# Patient Record
Sex: Male | Born: 1935
Health system: Southern US, Community
[De-identification: ages and names within clinical notes are randomized; demographics above are authoritative.]

## PROBLEM LIST (undated history)

## (undated) DIAGNOSIS — F419 Anxiety disorder, unspecified: Secondary | ICD-10-CM

## (undated) DIAGNOSIS — Z85828 Personal history of other malignant neoplasm of skin: Secondary | ICD-10-CM

## (undated) DIAGNOSIS — M51369 Other intervertebral disc degeneration, lumbar region without mention of lumbar back pain or lower extremity pain: Secondary | ICD-10-CM

## (undated) DIAGNOSIS — N201 Calculus of ureter: Secondary | ICD-10-CM

## (undated) DIAGNOSIS — M5136 Other intervertebral disc degeneration, lumbar region: Secondary | ICD-10-CM

## (undated) DIAGNOSIS — T7840XA Allergy, unspecified, initial encounter: Secondary | ICD-10-CM

## (undated) DIAGNOSIS — H269 Unspecified cataract: Secondary | ICD-10-CM

## (undated) DIAGNOSIS — E785 Hyperlipidemia, unspecified: Secondary | ICD-10-CM

## (undated) DIAGNOSIS — N138 Other obstructive and reflux uropathy: Secondary | ICD-10-CM

## (undated) DIAGNOSIS — N200 Calculus of kidney: Secondary | ICD-10-CM

## (undated) DIAGNOSIS — N210 Calculus in bladder: Secondary | ICD-10-CM

## (undated) DIAGNOSIS — L570 Actinic keratosis: Secondary | ICD-10-CM

## (undated) DIAGNOSIS — Z8589 Personal history of malignant neoplasm of other organs and systems: Secondary | ICD-10-CM

## (undated) DIAGNOSIS — N400 Enlarged prostate without lower urinary tract symptoms: Secondary | ICD-10-CM

## (undated) DIAGNOSIS — N401 Enlarged prostate with lower urinary tract symptoms: Secondary | ICD-10-CM

## (undated) DIAGNOSIS — R310 Gross hematuria: Secondary | ICD-10-CM

## (undated) DIAGNOSIS — F329 Major depressive disorder, single episode, unspecified: Secondary | ICD-10-CM

## (undated) DIAGNOSIS — M4300 Spondylolysis, site unspecified: Secondary | ICD-10-CM

## (undated) HISTORY — DX: Gross hematuria: R31.0

## (undated) HISTORY — DX: Benign prostatic hyperplasia with lower urinary tract symptoms: N40.1

## (undated) HISTORY — DX: Spondylolysis, site unspecified: M43.00

## (undated) HISTORY — DX: Unspecified cataract: H26.9

## (undated) HISTORY — DX: Actinic keratosis: L57.0

## (undated) HISTORY — PX: ILEOSTOMY: SHX1783

## (undated) HISTORY — DX: Major depressive disorder, single episode, unspecified: F32.9

## (undated) HISTORY — DX: Allergy, unspecified, initial encounter: T78.40XA

## (undated) HISTORY — DX: Calculus of kidney: N20.0

## (undated) HISTORY — DX: Calculus in bladder: N21.0

## (undated) HISTORY — DX: Other intervertebral disc degeneration, lumbar region: M51.36

## (undated) HISTORY — DX: Other obstructive and reflux uropathy: N13.8

## (undated) HISTORY — DX: Calculus of ureter: N20.1

## (undated) HISTORY — DX: Anxiety disorder, unspecified: F41.9

## (undated) HISTORY — DX: Personal history of malignant neoplasm of other organs and systems: Z85.89

## (undated) HISTORY — DX: Hyperlipidemia, unspecified: E78.5

## (undated) HISTORY — DX: Other intervertebral disc degeneration, lumbar region without mention of lumbar back pain or lower extremity pain: M51.369

## (undated) HISTORY — DX: Benign prostatic hyperplasia without lower urinary tract symptoms: N40.0

---
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
## 1991-01-26 HISTORY — PX: HERNIA REPAIR: SHX51

## 2009-06-24 DIAGNOSIS — C4491 Basal cell carcinoma of skin, unspecified: Secondary | ICD-10-CM

## 2009-06-24 HISTORY — DX: Basal cell carcinoma of skin, unspecified: C44.91

## 2012-09-11 DIAGNOSIS — C4492 Squamous cell carcinoma of skin, unspecified: Secondary | ICD-10-CM

## 2012-09-11 HISTORY — DX: Squamous cell carcinoma of skin, unspecified: C44.92

## 2013-02-07 ENCOUNTER — Emergency Department: Payer: Self-pay | Admitting: Emergency Medicine

## 2013-02-07 LAB — COMPREHENSIVE METABOLIC PANEL
ALK PHOS: 96 U/L
ANION GAP: 4 — AB (ref 7–16)
Albumin: 3.6 g/dL (ref 3.4–5.0)
BUN: 25 mg/dL — ABNORMAL HIGH (ref 7–18)
Bilirubin,Total: 0.4 mg/dL (ref 0.2–1.0)
Calcium, Total: 8.7 mg/dL (ref 8.5–10.1)
Chloride: 105 mmol/L (ref 98–107)
Co2: 28 mmol/L (ref 21–32)
Creatinine: 1.14 mg/dL (ref 0.60–1.30)
EGFR (African American): >60
EGFR (Non-African Amer.): >60
Glucose: 117 mg/dL — ABNORMAL HIGH (ref 65–99)
Osmolality: 279 (ref 275–301)
Potassium: 4.4 mmol/L (ref 3.5–5.1)
SGOT(AST): 36 U/L (ref 15–37)
SGPT (ALT): 34 U/L (ref 12–78)
SODIUM: 137 mmol/L (ref 136–145)
Total Protein: 6.5 g/dL (ref 6.4–8.2)

## 2013-02-07 LAB — CBC
HCT: 43.7 % (ref 40.0–52.0)
HGB: 14.8 g/dL (ref 13.0–18.0)
MCH: 29.2 pg (ref 26.0–34.0)
MCHC: 33.8 g/dL (ref 32.0–36.0)
MCV: 86 fL (ref 80–100)
Platelet: 135 10*3/uL — ABNORMAL LOW (ref 150–440)
RBC: 5.07 10*6/uL (ref 4.40–5.90)
RDW: 13 % (ref 11.5–14.5)
WBC: 11.2 10*3/uL — AB (ref 3.8–10.6)

## 2013-02-07 LAB — LIPASE, BLOOD: Lipase: 177 U/L (ref 73–393)

## 2013-02-08 LAB — URINALYSIS, COMPLETE
Bacteria: NONE SEEN
Bilirubin,UR: NEGATIVE
Glucose,UR: NEGATIVE mg/dL (ref 0–75)
Leukocyte Esterase: NEGATIVE
Nitrite: NEGATIVE
Ph: 5 (ref 4.5–8.0)
Protein: NEGATIVE
Specific Gravity: 1.017 (ref 1.003–1.030)
Squamous Epithelial: NONE SEEN

## 2013-02-10 ENCOUNTER — Emergency Department: Payer: Self-pay | Admitting: Emergency Medicine

## 2013-02-11 LAB — URINALYSIS, COMPLETE
Bilirubin,UR: NEGATIVE
GLUCOSE, UR: NEGATIVE mg/dL (ref 0–75)
Nitrite: NEGATIVE
Ph: 5 (ref 4.5–8.0)
Protein: NEGATIVE
RBC,UR: 2 /HPF (ref 0–5)
Specific Gravity: 1.015 (ref 1.003–1.030)
Squamous Epithelial: NONE SEEN

## 2013-02-12 ENCOUNTER — Emergency Department: Payer: Self-pay | Admitting: Emergency Medicine

## 2013-02-12 LAB — URINALYSIS, COMPLETE
BILIRUBIN, UR: NEGATIVE
Bacteria: NONE SEEN
Glucose,UR: NEGATIVE mg/dL (ref 0–75)
Ketone: NEGATIVE
NITRITE: NEGATIVE
PH: 5 (ref 4.5–8.0)
Protein: NEGATIVE
RBC,UR: 4 /HPF (ref 0–5)
Specific Gravity: 1.012 (ref 1.003–1.030)
Squamous Epithelial: NONE SEEN
WBC UR: 9 /HPF (ref 0–5)

## 2013-02-12 LAB — COMPREHENSIVE METABOLIC PANEL
AST: 54 U/L — AB (ref 15–37)
Albumin: 3.1 g/dL — ABNORMAL LOW (ref 3.4–5.0)
Alkaline Phosphatase: 92 U/L
Anion Gap: 4 — ABNORMAL LOW (ref 7–16)
BUN: 23 mg/dL — ABNORMAL HIGH (ref 7–18)
Bilirubin,Total: 0.6 mg/dL (ref 0.2–1.0)
CO2: 28 mmol/L (ref 21–32)
Calcium, Total: 8.2 mg/dL — ABNORMAL LOW (ref 8.5–10.1)
Chloride: 100 mmol/L (ref 98–107)
Creatinine: 1.52 mg/dL — ABNORMAL HIGH (ref 0.60–1.30)
EGFR (African American): 50 — ABNORMAL LOW
EGFR (Non-African Amer.): 43 — ABNORMAL LOW
Glucose: 126 mg/dL — ABNORMAL HIGH (ref 65–99)
Osmolality: 270 (ref 275–301)
POTASSIUM: 3.9 mmol/L (ref 3.5–5.1)
SGPT (ALT): 50 U/L (ref 12–78)
Sodium: 132 mmol/L — ABNORMAL LOW (ref 136–145)
TOTAL PROTEIN: 6.4 g/dL (ref 6.4–8.2)

## 2013-02-12 LAB — CBC
HCT: 40.6 % (ref 40.0–52.0)
HGB: 13.9 g/dL (ref 13.0–18.0)
MCH: 29.6 pg (ref 26.0–34.0)
MCHC: 34.1 g/dL (ref 32.0–36.0)
MCV: 87 fL (ref 80–100)
PLATELETS: 99 10*3/uL — AB (ref 150–440)
RBC: 4.68 10*6/uL (ref 4.40–5.90)
RDW: 12.8 % (ref 11.5–14.5)
WBC: 4.3 10*3/uL (ref 3.8–10.6)

## 2013-02-13 ENCOUNTER — Ambulatory Visit: Payer: Self-pay | Admitting: Urology

## 2013-02-15 ENCOUNTER — Ambulatory Visit: Payer: Self-pay | Admitting: Urology

## 2013-02-17 ENCOUNTER — Emergency Department: Payer: Self-pay | Admitting: Emergency Medicine

## 2013-02-17 LAB — COMPREHENSIVE METABOLIC PANEL
ALBUMIN: 3.8 g/dL (ref 3.4–5.0)
Alkaline Phosphatase: 114 U/L
Anion Gap: 4 — ABNORMAL LOW (ref 7–16)
BILIRUBIN TOTAL: 0.7 mg/dL (ref 0.2–1.0)
BUN: 14 mg/dL (ref 7–18)
CO2: 26 mmol/L (ref 21–32)
CREATININE: 1.07 mg/dL (ref 0.60–1.30)
Calcium, Total: 8.5 mg/dL (ref 8.5–10.1)
Chloride: 106 mmol/L (ref 98–107)
EGFR (Non-African Amer.): >60
Glucose: 118 mg/dL — ABNORMAL HIGH (ref 65–99)
OSMOLALITY: 274 (ref 275–301)
Potassium: 3.6 mmol/L (ref 3.5–5.1)
SGOT(AST): 39 U/L — ABNORMAL HIGH (ref 15–37)
SGPT (ALT): 70 U/L (ref 12–78)
Sodium: 136 mmol/L (ref 136–145)
TOTAL PROTEIN: 7.4 g/dL (ref 6.4–8.2)

## 2013-02-17 LAB — URINALYSIS, COMPLETE
RBC,UR: 36002 /HPF (ref 0–5)
Specific Gravity: 1.01 (ref 1.003–1.030)
Squamous Epithelial: NONE SEEN
WBC UR: 136 /HPF (ref 0–5)

## 2013-02-17 LAB — TROPONIN I

## 2013-02-17 LAB — CBC
HCT: 47.6 % (ref 40.0–52.0)
HGB: 16.1 g/dL (ref 13.0–18.0)
MCH: 29.4 pg (ref 26.0–34.0)
MCHC: 33.9 g/dL (ref 32.0–36.0)
MCV: 87 fL (ref 80–100)
Platelet: 223 10*3/uL (ref 150–440)
RBC: 5.49 10*6/uL (ref 4.40–5.90)
RDW: 13 % (ref 11.5–14.5)
WBC: 8.8 10*3/uL (ref 3.8–10.6)

## 2013-02-20 ENCOUNTER — Emergency Department: Payer: Self-pay | Admitting: Emergency Medicine

## 2013-02-20 LAB — COMPREHENSIVE METABOLIC PANEL
AST: 38 U/L — AB (ref 15–37)
Albumin: 3.8 g/dL (ref 3.4–5.0)
Alkaline Phosphatase: 121 U/L — ABNORMAL HIGH
Anion Gap: 6 — ABNORMAL LOW (ref 7–16)
BILIRUBIN TOTAL: 0.7 mg/dL (ref 0.2–1.0)
BUN: 21 mg/dL — ABNORMAL HIGH (ref 7–18)
CO2: 24 mmol/L (ref 21–32)
Calcium, Total: 9.2 mg/dL (ref 8.5–10.1)
Chloride: 105 mmol/L (ref 98–107)
Creatinine: 1.05 mg/dL (ref 0.60–1.30)
EGFR (Non-African Amer.): >60
GLUCOSE: 76 mg/dL (ref 65–99)
Osmolality: 272 (ref 275–301)
POTASSIUM: 3.9 mmol/L (ref 3.5–5.1)
SGPT (ALT): 62 U/L (ref 12–78)
SODIUM: 135 mmol/L — AB (ref 136–145)
Total Protein: 7.4 g/dL (ref 6.4–8.2)

## 2013-02-20 LAB — URINALYSIS, COMPLETE
Bacteria: NONE SEEN
Hyaline Cast: 196
Specific Gravity: 1.018 (ref 1.003–1.030)
WBC UR: 209 /HPF (ref 0–5)

## 2013-02-20 LAB — CBC
HCT: 50.4 % (ref 40.0–52.0)
HGB: 16.9 g/dL (ref 13.0–18.0)
MCH: 29.2 pg (ref 26.0–34.0)
MCHC: 33.5 g/dL (ref 32.0–36.0)
MCV: 87 fL (ref 80–100)
PLATELETS: 255 10*3/uL (ref 150–440)
RBC: 5.79 10*6/uL (ref 4.40–5.90)
RDW: 13.2 % (ref 11.5–14.5)
WBC: 9.9 10*3/uL (ref 3.8–10.6)

## 2013-02-23 ENCOUNTER — Ambulatory Visit: Payer: Self-pay | Admitting: Urology

## 2013-02-25 DIAGNOSIS — N200 Calculus of kidney: Secondary | ICD-10-CM

## 2013-02-25 HISTORY — DX: Calculus of kidney: N20.0

## 2013-03-25 HISTORY — PX: LITHOTRIPSY: SUR834

## 2013-03-29 ENCOUNTER — Ambulatory Visit: Payer: Self-pay | Admitting: Urology

## 2013-03-29 DIAGNOSIS — N201 Calculus of ureter: Secondary | ICD-10-CM | POA: Insufficient documentation

## 2013-03-29 HISTORY — DX: Calculus of ureter: N20.1

## 2013-04-26 ENCOUNTER — Ambulatory Visit: Payer: Self-pay | Admitting: Urology

## 2013-04-26 DIAGNOSIS — N210 Calculus in bladder: Secondary | ICD-10-CM | POA: Insufficient documentation

## 2013-04-26 HISTORY — DX: Calculus in bladder: N21.0

## 2013-05-01 ENCOUNTER — Ambulatory Visit: Payer: Self-pay | Admitting: Urology

## 2013-05-10 ENCOUNTER — Ambulatory Visit: Payer: Self-pay | Admitting: Urology

## 2013-05-21 ENCOUNTER — Ambulatory Visit: Payer: Self-pay | Admitting: Urology

## 2013-06-06 ENCOUNTER — Ambulatory Visit: Payer: Self-pay | Admitting: Urology

## 2013-06-20 ENCOUNTER — Ambulatory Visit: Payer: Self-pay | Admitting: Urology

## 2013-06-20 DIAGNOSIS — N400 Enlarged prostate without lower urinary tract symptoms: Secondary | ICD-10-CM

## 2013-06-20 HISTORY — DX: Benign prostatic hyperplasia without lower urinary tract symptoms: N40.0

## 2013-06-23 LAB — URINE CULTURE

## 2013-06-27 ENCOUNTER — Emergency Department: Payer: Self-pay | Admitting: Emergency Medicine

## 2013-06-27 ENCOUNTER — Ambulatory Visit: Payer: Self-pay | Admitting: Urology

## 2013-06-28 DIAGNOSIS — R310 Gross hematuria: Secondary | ICD-10-CM | POA: Insufficient documentation

## 2013-06-28 HISTORY — DX: Gross hematuria: R31.0

## 2014-03-27 ENCOUNTER — Ambulatory Visit: Payer: Self-pay | Admitting: Urology

## 2014-05-18 NOTE — Op Note (Signed)
PATIENT NAME:  Todd Bennett, THEARD MR#:  [id number] DATE OF BIRTH:  05-18-1932  DATE OF PROCEDURE:  02/15/2013  PREOPERATIVE DIAGNOSIS: Right proximal ureteral calculus with pain.   POSTOPERATIVE DIAGNOSIS: Passed right ureteral calculus.   PROCEDURE: Cystoscopy, right retrograde pyelogram.   SURGEON: Richard D. Elnoria Howard, DO  ANESTHESIA: General.   DESCRIPTION OF PROCEDURE: After an appropriate timeout and review of the patient's surgical consent, we began the procedure. The patient was sterilely prepped and draped in supine lithotomy position for ease of approach to the external genitalia. The urethra was easily instrumented with a 21-French sheath and a 4 oblique lens. Looking in the bladder I see a stone lying in the base of the bladder. It was a good sized calculus. I did not grasp it at this point as I wanted to do a retrograde. Retrograde pyelogram was done. The kidney easily empties. There is no obstruction to the ureter. There is no sign of another calculus in the ureter. So, the bladder was emptied. I attempted to evacuate the calculus with irrigation, but not able to, into a trabeculation in the bladder. The rest of the bladder showed some course trabeculation with prostatic enlargement with no stricture disease. So, at the end of the procedure, the bladder was emptied. No rectal was done as the patient has no rectum from a colectomy many years ago and ileostomy. So, we put 30 mL of Marcaine in the bladder, and he was sent to recovery in satisfactory condition.   ____________________________ Janice Coffin. Elnoria Howard, [place] rdh:lb D: 02/15/2013 12:07:05 ET T: 02/15/2013 12:12:50 ET JOB#: [id number]  cc: Janice Coffin. Elnoria Howard, DO, <Dictator> RICHARD D HART DO ELECTRONICALLY SIGNED 02/26/2013 16:38

## 2014-05-18 NOTE — Op Note (Signed)
PATIENT NAME:  Todd Bennett, Todd Bennett MR#:  [id number] DATE OF BIRTH:  07-06-1932  DATE OF PROCEDURE:  06/27/2013  PREOPERATIVE DIAGNOSES:  1. Left distal ureteral calculus.  2. Bladder calculus.   POSTOPERATIVE DIAGNOSES:  1. Left distal ureteral calculus.  2. Bladder calculus.   PROCEDURES:  1. Left ureteroscopy with laser lithotripsy/stone extraction.  2. Basket extraction of bladder calculus.   SURGEON: Scott C. Bernardo Heater, MD  ASSISTANT: None.   ANESTHETIC: General.   INDICATIONS: A 81 year old male was initially diagnosed in January 2015 with right renal colic. He was scheduled for placement of a right ureteral stent by Dr. Elnoria Howard. However, at the time of his procedure, the stone was no longer present in the ureter and was in the bladder and apparently could not be removed. He does have prostate enlargement and enlarged median lobe and has been unable to pass the stone, though asymptomatic. On review of his x-rays, he was also noted to have a nonobstructing 5 or 6 mm left distal ureteral calculus. He underwent shock wave lithotripsy without significant fragmentation. He presents for left ureteroscopic stone removal and removal of his bladder calculus.   DESCRIPTION OF PROCEDURE: He was taken to the cystoscopy suite, placed on the table in the supine position. A general anesthetic was administered via an LMA. He was then placed in the low lithotomy position, and his external genitalia were prepped and draped in the usual fashion. A timeout was performed per protocol. A 21 French cystoscope with 30 degree lens was lubricated and passed under direct vision. The urethra was normal in caliber without stricture. Prostate was remarkable for touching lateral lobes and a moderate to large median lobe. Bladder mucosa was closely inspected, and there was no erythema, solid or papillary lesions. The bladder stone was visualized at the base of the bladder. The ureteral orifices were normal-appearing with clear  efflux. A 0.102 hydrophilic guidewire was placed through the cystoscope and into the left ureteral orifice and passed up into the renal pelvis under fluoroscopic guidance without difficulty. The cystoscope was removed, and a 6 French semirigid ureteroscope was passed per urethra. The bladder was entered, and the left ureter was entered without difficulty. In the midportion of the distal ureter, the ureteral calculus was visualized, without significant inflammatory change. A 365 micron holmium laser fiber was placed through the ureteroscope, and the stone was easily fragmented. The fragments were removed with aid of a 3 [place] nitinol basket. The ureteroscope was repassed up the proximal ureter, and no additional stones or stone fragments were seen. There was no significant change at the distal ureter seen, and it was elected not to place a ureteral stent.   The ureteroscope was removed. The cystoscope was repassed. The bladder calculus was ensnared with the 3 French nitinol basket and removed without difficulty. He had a history of hematuria and urinary retention with his previous procedure, and an 38 French Foley catheter was placed without difficulty. The catheter was irrigated, with return of pink-tinged effluent. He was taken to the PACU in stable condition. There were no complications. EBL was minimal.     ____________________________ Ronda Fairly. Bernardo Heater, MD scs:lb D: 06/28/2013 10:52:03 ET T: 06/28/2013 11:20:39 ET JOB#: [id number]  cc: Nicki Reaper C. Bernardo Heater, MD, <Dictator> Abbie Sons MD ELECTRONICALLY SIGNED 07/04/2013 15:19

## 2014-06-17 DIAGNOSIS — F32A Depression, unspecified: Secondary | ICD-10-CM | POA: Insufficient documentation

## 2014-06-17 DIAGNOSIS — E785 Hyperlipidemia, unspecified: Secondary | ICD-10-CM

## 2014-06-17 DIAGNOSIS — F329 Major depressive disorder, single episode, unspecified: Secondary | ICD-10-CM | POA: Insufficient documentation

## 2014-06-17 DIAGNOSIS — F419 Anxiety disorder, unspecified: Secondary | ICD-10-CM

## 2014-06-17 DIAGNOSIS — N138 Other obstructive and reflux uropathy: Secondary | ICD-10-CM

## 2014-06-17 DIAGNOSIS — N401 Enlarged prostate with lower urinary tract symptoms: Secondary | ICD-10-CM

## 2014-06-17 HISTORY — DX: Benign prostatic hyperplasia with lower urinary tract symptoms: N13.8

## 2014-06-17 HISTORY — DX: Hyperlipidemia, unspecified: E78.5

## 2014-06-17 HISTORY — DX: Anxiety disorder, unspecified: F41.9

## 2014-06-17 HISTORY — DX: Depression, unspecified: F32.A

## 2014-10-01 ENCOUNTER — Encounter: Payer: Self-pay | Admitting: Podiatry

## 2014-10-01 ENCOUNTER — Ambulatory Visit (INDEPENDENT_AMBULATORY_CARE_PROVIDER_SITE_OTHER): Payer: PPO

## 2014-10-01 ENCOUNTER — Ambulatory Visit (INDEPENDENT_AMBULATORY_CARE_PROVIDER_SITE_OTHER): Payer: PPO | Admitting: Podiatry

## 2014-10-01 VITALS — BP 126/63 | HR 74 | Resp 18

## 2014-10-01 DIAGNOSIS — R52 Pain, unspecified: Secondary | ICD-10-CM

## 2014-10-01 DIAGNOSIS — M779 Enthesopathy, unspecified: Secondary | ICD-10-CM | POA: Diagnosis not present

## 2014-10-01 DIAGNOSIS — M19072 Primary osteoarthritis, left ankle and foot: Secondary | ICD-10-CM

## 2014-10-01 DIAGNOSIS — M898X9 Other specified disorders of bone, unspecified site: Secondary | ICD-10-CM

## 2014-10-01 NOTE — Progress Notes (Signed)
   Subjective:    Patient ID: Todd Bennett, male    DOB: [birth year], 81 y.o.   MRN: [id number]  HPI  81 year old male presents the office of this wife for concerns of left foot pain which has been ongoing for approximately 2 weeks. He states that he is started to note some not form the top of his left foot and pain to the area when he walks for prolonged distances. He does walk a treadmill about 3-4 miles a day however he is been doing this for quite some time and has not changed increases her activity recently. He doesn't assess the pain started he did decrease his walking and started to ride a stationary bike and the pain was not as significant. He denies any recent injury or trauma. Denies any tingling or numbness. The pain does not wake him up at night. He has had no previous treatment. No other complaints at this time   Review of Systems  All other systems reviewed and are negative.      Objective:   Physical Exam AAO x3, NAD DP/PT pulses palpable bilaterally, CRT less than 3 seconds Protective sensation intact with Simms Weinstein monofilament, vibratory sensation intact, Achilles tendon reflex intact On the dorsal aspect left midfoot there is a localized area of edema and what appears to be a small mobile fluid-filled soft tissue mass along the midfoot. There is underlying bony exostosis palpable. There is tenderness overlying this area. There is no overlying erythema or increase in warmth. There is no other areas of tenderness to bilateral lower extremities.  MMT 5/5, ROM WNL.  No open lesions or pre-ulcerative lesions.  No overlying edema, erythema, increase in warmth to bilateral lower extremities.  No pain with calf compression, swelling, warmth, erythema bilaterally.      Assessment & Plan:  81 year old male left dorsal midfoot exostosis, arthritis with likely ganglion cyst, localized edema -Treatment options discussed including all alternatives, risks, and complications -X-rays  were obtained and reviewed with the patient.  -Discussed etiology of his symptoms. -I discussed steroid injection area to help decrease the inflammation of the pain. He would proceed with this. I discussed risks and palpitations for which he verbally consents to. Under sterile conditions a total of 1.5 mL of a mixture of dexamethasone phosphate and 0.5% Marcaine plain was infiltrated into the area of maximal tenderness on the dorsal aspect of the left midfoot. He tolerated the injection well any complications. Post injection care was discussed. -Discussed with him to retire his shoes to skip a hole to avoid putting pressure overlying the area. -I discussed some orthotics to help support his foot type and help take pressure off the midfoot. -Follow-up 3-4 weeks if symptoms continue or sooner if any problems arise. In the meantime, encouraged to call the office with any questions, concerns, change in symptoms.   Celesta Gentile, DPM

## 2014-10-03 ENCOUNTER — Encounter: Payer: Self-pay | Admitting: Podiatry

## 2014-10-19 ENCOUNTER — Other Ambulatory Visit: Payer: Self-pay | Admitting: Urology

## 2014-10-22 ENCOUNTER — Ambulatory Visit: Payer: PPO | Admitting: Podiatry

## 2014-10-29 ENCOUNTER — Ambulatory Visit (INDEPENDENT_AMBULATORY_CARE_PROVIDER_SITE_OTHER): Payer: PPO | Admitting: Podiatry

## 2014-10-29 ENCOUNTER — Encounter: Payer: Self-pay | Admitting: Podiatry

## 2014-10-29 VITALS — BP 148/62 | HR 71 | Resp 18

## 2014-10-29 DIAGNOSIS — M898X9 Other specified disorders of bone, unspecified site: Secondary | ICD-10-CM

## 2014-10-29 DIAGNOSIS — M216X9 Other acquired deformities of unspecified foot: Secondary | ICD-10-CM

## 2014-10-29 DIAGNOSIS — M6740 Ganglion, unspecified site: Secondary | ICD-10-CM

## 2014-10-29 NOTE — Progress Notes (Signed)
Patient ID: Todd Bennett, male   DOB: 24-Oct-1932, 81 y.o.   MRN: [id number]  Subjective: 81 year old male presents the office they for follow-up evaluation reoccurrence of left midfoot pain and swelling. He states that he canceled last week's appointment as he was doing well however over the week and the pain started to recur.  He states at the last appointment the swelling and the pain resolved.He states he gets some discomfort around the area with shoe gear. Denies any recent injury or trauma. Denies any redness overlying the area. No tingling or numbness. No other complaints at this time in no acute changes otherwise.  Objective: AAO 3, NAD Neurovascular status intact and unchanged On the dorsal aspect of left midfoot there is a localized area of edema which appears to be a fluid-filled mass overlying the area.There is a palpable exostosis off the midfoot lateral to the dorsalis pedis. There is no overlying erythema or increase in warmth over the area. There is localized tenderness overlying this area of exostosis and soft tissue mass. No other areas of edema, erythema, increase in warmth. There is increase in calcaneal inclination angle. There are no open lesions or pre-ulcerative lesions. There is no pain with calf compression, swelling, warmth, erythema.  Assessment: 81 year old male reoccurrence of dorsal midfoot exostosis , soft tissue mass  Plan: -Treatment options discussed including all alternatives, risks, and complications -Etiology of symptoms were discussed - I discussed aspiration of the parents cyst. I discussed risks and complications for which she understands and wishes to proceed with this. Under sterile conditions a total of 1.5 mL of a one-to-one mixture of 2% lidocaine plain and 0.5% Marcaine plain was infiltrated in a regional block fashion. Once anesthetized the skin was then prepped in sterile fashion. A #18-gauge needle and syringe was utilized to aspirate the area soft  tissue mass.There was a very small amount of clear fluid and some blood that was aspirated. Total of 1 mL mixture of baclofen phosphate and 0.5% Marcaine plain was infiltrated into this area without complications. Hemostasis was achieved. The compression bandage was applied. Post procedure care was discussed. -Given his foot type he would likely benefit from orthotics. A prescription for inserts were given to the patient for [facility]. -Follow-up after inserts or sooner if any problems arise. In the meantime, encouraged to call the office with any questions, concerns, change in symptoms.   Celesta Gentile, DPM

## 2015-03-10 DIAGNOSIS — D044 Carcinoma in situ of skin of scalp and neck: Secondary | ICD-10-CM | POA: Diagnosis not present

## 2015-03-10 DIAGNOSIS — L570 Actinic keratosis: Secondary | ICD-10-CM | POA: Diagnosis not present

## 2015-03-10 DIAGNOSIS — L578 Other skin changes due to chronic exposure to nonionizing radiation: Secondary | ICD-10-CM | POA: Diagnosis not present

## 2015-03-10 DIAGNOSIS — D485 Neoplasm of uncertain behavior of skin: Secondary | ICD-10-CM | POA: Diagnosis not present

## 2015-03-10 DIAGNOSIS — L718 Other rosacea: Secondary | ICD-10-CM | POA: Diagnosis not present

## 2015-03-10 DIAGNOSIS — L820 Inflamed seborrheic keratosis: Secondary | ICD-10-CM | POA: Diagnosis not present

## 2015-03-12 DIAGNOSIS — Z0000 Encounter for general adult medical examination without abnormal findings: Secondary | ICD-10-CM | POA: Diagnosis not present

## 2015-03-20 DIAGNOSIS — N401 Enlarged prostate with lower urinary tract symptoms: Secondary | ICD-10-CM | POA: Diagnosis not present

## 2015-03-20 DIAGNOSIS — F329 Major depressive disorder, single episode, unspecified: Secondary | ICD-10-CM | POA: Diagnosis not present

## 2015-03-20 DIAGNOSIS — Z0000 Encounter for general adult medical examination without abnormal findings: Secondary | ICD-10-CM | POA: Diagnosis not present

## 2015-03-20 DIAGNOSIS — E785 Hyperlipidemia, unspecified: Secondary | ICD-10-CM | POA: Diagnosis not present

## 2015-04-01 ENCOUNTER — Ambulatory Visit: Payer: Self-pay | Admitting: Urology

## 2015-04-02 ENCOUNTER — Ambulatory Visit
Admission: RE | Admit: 2015-04-02 | Discharge: 2015-04-02 | Disposition: A | Discharge status: Home / Self Care | Payer: PPO | Source: Ambulatory Visit | Attending: Urology | Admitting: Urology

## 2015-04-02 ENCOUNTER — Encounter: Payer: Self-pay | Admitting: Urology

## 2015-04-02 ENCOUNTER — Ambulatory Visit (INDEPENDENT_AMBULATORY_CARE_PROVIDER_SITE_OTHER): Payer: PPO | Admitting: Urology

## 2015-04-02 VITALS — BP 136/73 | HR 82 | Ht 71.0 in | Wt 168.0 lb

## 2015-04-02 DIAGNOSIS — Z87442 Personal history of urinary calculi: Secondary | ICD-10-CM

## 2015-04-02 DIAGNOSIS — N400 Enlarged prostate without lower urinary tract symptoms: Secondary | ICD-10-CM | POA: Diagnosis not present

## 2015-04-02 DIAGNOSIS — R339 Retention of urine, unspecified: Secondary | ICD-10-CM | POA: Diagnosis not present

## 2015-04-02 DIAGNOSIS — N200 Calculus of kidney: Secondary | ICD-10-CM | POA: Insufficient documentation

## 2015-04-02 LAB — URINALYSIS, COMPLETE
Bilirubin, UA: NEGATIVE
Glucose, UA: NEGATIVE
Ketones, UA: NEGATIVE
Leukocytes, UA: NEGATIVE
NITRITE UA: NEGATIVE
PH UA: 5.5 (ref 5.0–7.5)
PROTEIN UA: NEGATIVE
RBC, UA: NEGATIVE
SPEC GRAV UA: 1.01 (ref 1.005–1.030)
UUROB: 0.2 mg/dL (ref 0.2–1.0)

## 2015-04-02 LAB — MICROSCOPIC EXAMINATION
BACTERIA UA: NONE SEEN
EPITHELIAL CELLS (NON RENAL): NONE SEEN /HPF (ref 0–10)
WBC UA: NONE SEEN /HPF (ref 0–?)

## 2015-04-02 LAB — BLADDER SCAN AMB NON-IMAGING

## 2015-04-02 NOTE — Progress Notes (Signed)
04/02/2015 9:16 AM   Todd Bennett 1932/06/06 [id number]  Referring provider: [name], MD [address] [place], [place] [address]  Chief Complaint  Patient presents with  . Benign Prostatic Hypertrophy    1year     HPI:  BPH/ incomplete bladder emptying No UTIs over past year.  Remote history of bladder stones in '86. History of post op urinary retention. Currently on Flomax and finasteride.   PVR today 150  Cr stable, normal No voiding complaints today other than frequency, urgency but does not want medication for this, IPSS below  History of nephrolithiasis H/o calcium oxalate monohydrate/ Ca Phosphate stones H/o ilostomy, struggles with dehydration [id number] multiple recurrent stones,  2015 left ESWL followed by left ureteroscopy by Dr. Elnoria Howard for 6 mm ureteral stone No recent flank pain KUB today with punctate bilateral stones, stable x 1 year       IPSS      04/02/15 0900       International Prostate Symptom Score   How often have you had the sensation of not emptying your bladder? About half the time     How often have you had to urinate less than every two hours? Almost always     How often have you found you stopped and started again several times when you urinated? About half the time     How often have you found it difficult to postpone urination? More than half the time     How often have you had a weak urinary stream? Less than half the time     How often have you had to strain to start urination? Less than half the time     How many times did you typically get up at night to urinate? 1 Time     Total IPSS Score 20     Quality of Life due to urinary symptoms   If you were to spend the rest of your life with your urinary condition just the way it is now how would you feel about that? Mixed        Score:  1-7 Mild 8-19 Moderate 20-35 Severe   PMH: Past Medical History  Diagnosis Date  . Benign prostatic hyperplasia with urinary  obstruction 06/17/2014  . Benign prostatic hypertrophy without urinary obstruction 06/20/2013  . Bladder calculi 04/26/2013  . Calculi, ureter 03/29/2013  . Calculus of kidney 02/25/2013  . Frank hematuria 06/28/2013  . Anxiety 06/17/2014  . Clinical depression 06/17/2014  . HLD (hyperlipidemia) 06/17/2014    Surgical History: Past Surgical History  Procedure Laterality Date  . Ileostomy    . Lithotripsy  03/2013  . Hernia repair  1993    Home Medications:    Medication List       This list is accurate as of: 04/02/15  9:16 AM.  Always use your most recent med list.               ACZONE 5 % topical gel  Generic drug:  Dapsone     aspirin EC 81 MG tablet  Take by mouth.     CoQ-10 200 MG Caps  Take 200 mg by mouth.     finasteride 5 MG tablet  Commonly known as:  PROSCAR  TAKE 1 TABLET EVERY DAY     Fish Oil 1000 MG Caps  Take by mouth.     FLUoxetine 20 MG capsule  Commonly known as:  PROZAC  Take 20 mg by mouth.  metroNIDAZOLE 1 % gel  Commonly known as:  METROGEL  Apply topically.     MULTI-VITAMINS Tabs  Take by mouth.     niacin 500 MG tablet  Take 500 mg by mouth.     prednisoLONE acetate 1 % ophthalmic suspension  Commonly known as:  PRED FORTE  PLACE 1 DROP IN THE LEFT EYE DAILY     tamsulosin 0.4 MG Caps capsule  Commonly known as:  FLOMAX  Take 0.4 mg by mouth.     venlafaxine XR 150 MG 24 hr capsule  Commonly known as:  EFFEXOR-XR  Take 150 mg by mouth.     vitamin C 1000 MG tablet  Take 1,000 mg by mouth.     VITAMIN D-1000 MAX ST 1000 units tablet  Generic drug:  Cholecalciferol  Take by mouth.        Allergies:  Allergies  Allergen Reactions  . Ciprofloxacin   . Clarithromycin   . Oxycodone   . Penicillins   . Sulfur     Family History: Family History  Problem Relation Age of Onset  . Diabetes Mother   . Prostate cancer Neg Hx   . Bladder Cancer Neg Hx     Social History:  reports that he has quit smoking. He has  never used smokeless tobacco. He reports that he does not drink alcohol or use illicit drugs.  ROS: UROLOGY Frequent Urination?: No Hard to postpone urination?: Yes Burning/pain with urination?: No Get up at night to urinate?: Yes Leakage of urine?: No Urine stream starts and stops?: No Trouble starting stream?: No Do you have to strain to urinate?: No Blood in urine?: No Urinary tract infection?: No Sexually transmitted disease?: No Injury to kidneys or bladder?: No Painful intercourse?: No Weak stream?: No Erection problems?: Yes Penile pain?: No  Gastrointestinal Nausea?: No Vomiting?: No Indigestion/heartburn?: No Diarrhea?: No Constipation?: No  Constitutional Fever: No Night sweats?: No Weight loss?: No Fatigue?: No  Skin Skin rash/lesions?: No Itching?: No  Eyes Blurred vision?: No Double vision?: No  Ears/Nose/Throat Sore throat?: No Sinus problems?: No  Hematologic/Lymphatic Swollen glands?: No Easy bruising?: No  Cardiovascular Leg swelling?: No Chest pain?: No  Respiratory Cough?: No Shortness of breath?: No  Endocrine Excessive thirst?: No  Musculoskeletal Back pain?: No Joint pain?: No  Neurological Headaches?: No Dizziness?: No  Psychologic Depression?: No Anxiety?: No  Physical Exam: BP 136/73 mmHg  Pulse 82  Ht 5' 11"  (1.803 m)  Wt 168 lb (76.204 kg)  BMI 23.44 kg/m2  Constitutional:  Alert and oriented, No acute distress. HEENT: [place] AT, moist mucus membranes.  Trachea midline, no masses. Cardiovascular: No clubbing, cyanosis, or edema. Respiratory: Normal respiratory effort, no increased work of breathing. GI: Abdomen is soft, nontender, nondistended, no abdominal masses GU: No CVA tenderness.  Skin: No rashes, bruises or suspicious lesions. Neurologic: Grossly intact, no focal deficits, moving all 4 extremities. Psychiatric: Normal mood and affect.  Laboratory Data: Lab Results  Component Value Date   WBC 9.9  02/20/2013   HGB 16.9 02/20/2013   HCT 50.4 02/20/2013   MCV 87 02/20/2013   PLT 255 02/20/2013    Comprehensive Metabolic Panel (CMP) (22/63/3354 8:39 AM) Comprehensive Metabolic Panel (CMP) (56/25/6389 8:39 AM)  Component Value Ref Range  Glucose 107 70 - 110 mg/dL  Sodium 140 136 - 145 mmol/L  Potassium 4.2 3.6 - 5.1 mmol/L  Chloride 103 97 - 109 mmol/L  Carbon Dioxide (CO2) 27.6 22.0 - 32.0 mmol/L  Urea  Nitrogen (BUN) 17 7 - 25 mg/dL  Creatinine 1.0 0.7 - 1.3 mg/dL  Glomerular Filtration Rate (eGFR), MDRD Estimate 72 >60 mL/min/1.73sq m    PSA 03/12/15 0.25, stable from 0.29 on 11/25  Urinalysis Urinalysis w/Microscopic (03/12/2015 8:39 AM) Urinalysis w/Microscopic (03/12/2015 8:39 AM)  Component Value Ref Range  Color Yellow Yellow, Straw  Clarity Clear Clear  Specific Gravity 1.010 1.000 - 1.030  pH, Urine 5.5 5.0 - 8.0  Protein, Urinalysis Negative Negative, Trace mg/dL  Glucose, Urinalysis Negative Negative mg/dL  Ketones, Urinalysis Negative Negative mg/dL  Blood, Urinalysis Negative Negative  Nitrite, Urinalysis Negative Negative  Leukocyte Esterase, Urinalysis Negative Negative  White Blood Cells, Urinalysis None Seen None Seen, 0-3 /hpf  Red Blood Cells, Urinalysis None Seen None Seen, 0-3 /hpf  Bacteria, Urinalysis None Seen None Seen /hpf  Squamous Epithelial Cells, Urinalysis None Seen Rare, Few, None Seen /hpf    Pertinent Imaging: CLINICAL DATA: History of kidney stones.  EXAM: ABDOMEN - 1 VIEW  COMPARISON: 03/27/2014  FINDINGS: Punctate stones project over the lower pole of both kidneys as seen on prior CT. Calcified phleboliths in the left pelvis. No organomegaly. No evidence of bowel obstruction. No free air.  IMPRESSION: Punctate bilateral nephrolithiasis.   Electronically Signed  By: Rolm Baptise M.D.  On: 04/02/2015 10:03  Assessment & Plan:    1. BPH (benign prostatic hyperplasia) Continue finasteride and flomax PSA  low - Urinalysis, Complete - BLADDER SCAN AMB NON-IMAGING  2. Incomplete bladder emptying PVR today borderline but no sequela (normal Cr, no bladder stones, no UTIs) Continue to monitor with annual PVR, return sooner if unable to void  3. History of nephrolithiasis KUB stable today Today to encourage hydration Return sooner if develops flank pain - DG Abd 1 View   Return in about 1 year (around 04/01/2016) for KUB, IPSS, PVR.  Hollice Espy, MD  [facility] [address] [place], [place] [address] [phone]

## 2015-04-03 DIAGNOSIS — L578 Other skin changes due to chronic exposure to nonionizing radiation: Secondary | ICD-10-CM | POA: Diagnosis not present

## 2015-04-03 DIAGNOSIS — Z85828 Personal history of other malignant neoplasm of skin: Secondary | ICD-10-CM | POA: Diagnosis not present

## 2015-04-03 DIAGNOSIS — D0439 Carcinoma in situ of skin of other parts of face: Secondary | ICD-10-CM | POA: Diagnosis not present

## 2015-04-18 ENCOUNTER — Ambulatory Visit (INDEPENDENT_AMBULATORY_CARE_PROVIDER_SITE_OTHER): Payer: PPO | Admitting: Sports Medicine

## 2015-04-18 ENCOUNTER — Encounter: Payer: Self-pay | Admitting: Sports Medicine

## 2015-04-18 DIAGNOSIS — M216X9 Other acquired deformities of unspecified foot: Secondary | ICD-10-CM

## 2015-04-18 DIAGNOSIS — M6740 Ganglion, unspecified site: Secondary | ICD-10-CM | POA: Diagnosis not present

## 2015-04-18 DIAGNOSIS — M19072 Primary osteoarthritis, left ankle and foot: Secondary | ICD-10-CM

## 2015-04-18 DIAGNOSIS — M79672 Pain in left foot: Secondary | ICD-10-CM

## 2015-04-18 DIAGNOSIS — M898X9 Other specified disorders of bone, unspecified site: Secondary | ICD-10-CM

## 2015-04-18 NOTE — Progress Notes (Signed)
Patient ID: Todd Bennett, male   DOB: 24-Sep-1932, 82 y.o.   MRN: [id number] Subjective: Todd Bennett is a 82 y.o. male patient who presents to office for evaluation of left foot pain. Patient states that over the last few days he started to have more pain and the cyst at the top of his foot becoming larger; reports that he had injection back in October that helped tremendously; patient requesting re-injection. Patient denies any other pedal complaints. Denies acute injury/trip/fall/sprain/any other causative factors.   Patient Active Problem List   Diagnosis Date Noted  . Anxiety 06/17/2014  . Benign prostatic hyperplasia with urinary obstruction 06/17/2014  . Clinical depression 06/17/2014  . HLD (hyperlipidemia) 06/17/2014  . Frank hematuria 06/28/2013  . Benign prostatic hypertrophy without urinary obstruction 06/20/2013  . Bladder calculi 04/26/2013  . Calculi, ureter 03/29/2013  . Calculus of kidney 02/25/2013    Current Outpatient Prescriptions on File Prior to Visit  Medication Sig Dispense Refill  . Ascorbic Acid (VITAMIN C) 1000 MG tablet Take 1,000 mg by mouth.    Marland Kitchen aspirin EC 81 MG tablet Take by mouth.    . Cholecalciferol (VITAMIN D-1000 MAX ST) 1000 UNITS tablet Take by mouth.    . Coenzyme Q10 (COQ-10) 200 MG CAPS Take 200 mg by mouth.    . Dapsone (ACZONE) 5 % topical gel     . finasteride (PROSCAR) 5 MG tablet TAKE 1 TABLET EVERY DAY 90 tablet 2  . FLUoxetine (PROZAC) 20 MG capsule Take 20 mg by mouth.    . metroNIDAZOLE (METROGEL) 1 % gel Apply topically.    . Multiple Vitamin (MULTI-VITAMINS) TABS Take by mouth.    . niacin 500 MG tablet Take 500 mg by mouth.    . Omega-3 Fatty Acids (FISH OIL) 1000 MG CAPS Take by mouth.    . prednisoLONE acetate (PRED FORTE) 1 % ophthalmic suspension PLACE 1 DROP IN THE LEFT EYE DAILY  3  . tamsulosin (FLOMAX) 0.4 MG CAPS capsule Take 0.4 mg by mouth.    . venlafaxine XR (EFFEXOR-XR) 150 MG 24 hr capsule Take 150 mg by mouth.      No current facility-administered medications on file prior to visit.    Allergies  Allergen Reactions  . Ciprofloxacin   . Clarithromycin   . Oxycodone   . Penicillins   . Sulfur     Objective:  General: Alert and oriented x3 in no acute distress  Dermatology: Focal transilluminating non pulsatile soft tissue mass resembling cyst dorsal midfoot on left, No signs of infection, no open lesions bilateral lower extremities, no webspace macerations, no ecchymosis bilateral, all nails x 10 are well manicured.  Vascular: Dorsalis Pedis and Posterior Tibial pedal pulses palpable, Capillary Fill Time 3 seconds,(+) pedal hair growth bilateral, Temperature gradient within normal limits.  Neurology: Johney Maine sensation intact via light touch bilateral. (- )Tinels sign bilateral.   Musculoskeletal: Mild tenderness with palpation at ganglion on left and across midtarsal joint on left with palpable exostosis, No pain with calf compression bilateral. Pes Cavus foot type. Strength within normal limits in all groups bilateral.   Assessment and Plan: Problem List Items Addressed This Visit    None    Visit Diagnoses    Ganglion    -  Primary    Bony exostosis        Cavus deformity of foot, acquired        Osteoarthritis of left foot, unspecified osteoarthritis type  Foot pain, left            -Complete examination performed -Previous Xrays reviewed -Discussed treatement options for ganglion and arthritis -After oral consent and aseptic prep, and local block consisting of 3cc 1:1 lidocaine and marcaine plain and then aspirated cyst removing 0.5cc of bloody gelatinous substance. Following injected 1 ml of kenalog 10 and applied compressive coban dressing to keep intact for 1 day. Post-injection care discussed with patient.  -Advised close monitoring for recurrence  -Recommend good supportive shoes for foot type -Patient to return to office as needed or sooner if condition  worsens.  Landis Martins, DPM

## 2015-04-25 DIAGNOSIS — H18421 Band keratopathy, right eye: Secondary | ICD-10-CM | POA: Diagnosis not present

## 2015-05-07 DIAGNOSIS — Z933 Colostomy status: Secondary | ICD-10-CM | POA: Diagnosis not present

## 2015-05-14 DIAGNOSIS — F419 Anxiety disorder, unspecified: Secondary | ICD-10-CM | POA: Diagnosis not present

## 2015-05-14 DIAGNOSIS — F329 Major depressive disorder, single episode, unspecified: Secondary | ICD-10-CM | POA: Diagnosis not present

## 2015-05-15 DIAGNOSIS — H18421 Band keratopathy, right eye: Secondary | ICD-10-CM | POA: Diagnosis not present

## 2015-07-09 DIAGNOSIS — L570 Actinic keratosis: Secondary | ICD-10-CM | POA: Diagnosis not present

## 2015-07-09 DIAGNOSIS — L821 Other seborrheic keratosis: Secondary | ICD-10-CM | POA: Diagnosis not present

## 2015-07-09 DIAGNOSIS — D1800 Hemangioma unspecified site: Secondary | ICD-10-CM | POA: Diagnosis not present

## 2015-07-09 DIAGNOSIS — Z85828 Personal history of other malignant neoplasm of skin: Secondary | ICD-10-CM | POA: Diagnosis not present

## 2015-07-09 DIAGNOSIS — L812 Freckles: Secondary | ICD-10-CM | POA: Diagnosis not present

## 2015-07-09 DIAGNOSIS — L820 Inflamed seborrheic keratosis: Secondary | ICD-10-CM | POA: Diagnosis not present

## 2015-07-09 DIAGNOSIS — L578 Other skin changes due to chronic exposure to nonionizing radiation: Secondary | ICD-10-CM | POA: Diagnosis not present

## 2015-07-19 ENCOUNTER — Other Ambulatory Visit: Payer: Self-pay | Admitting: Urology

## 2015-07-28 DIAGNOSIS — R2689 Other abnormalities of gait and mobility: Secondary | ICD-10-CM | POA: Diagnosis not present

## 2015-07-28 DIAGNOSIS — G20 Parkinson's disease: Secondary | ICD-10-CM | POA: Diagnosis not present

## 2015-08-05 DIAGNOSIS — H18421 Band keratopathy, right eye: Secondary | ICD-10-CM | POA: Diagnosis not present

## 2015-08-27 DIAGNOSIS — G20 Parkinson's disease: Secondary | ICD-10-CM | POA: Diagnosis not present

## 2015-08-27 DIAGNOSIS — R278 Other lack of coordination: Secondary | ICD-10-CM | POA: Diagnosis not present

## 2015-09-01 DIAGNOSIS — Z0000 Encounter for general adult medical examination without abnormal findings: Secondary | ICD-10-CM | POA: Diagnosis not present

## 2015-09-18 DIAGNOSIS — Z933 Colostomy status: Secondary | ICD-10-CM | POA: Diagnosis not present

## 2015-09-22 DIAGNOSIS — F419 Anxiety disorder, unspecified: Secondary | ICD-10-CM | POA: Diagnosis not present

## 2015-10-01 DIAGNOSIS — R2689 Other abnormalities of gait and mobility: Secondary | ICD-10-CM | POA: Diagnosis not present

## 2015-10-01 DIAGNOSIS — G20 Parkinson's disease: Secondary | ICD-10-CM | POA: Diagnosis not present

## 2015-10-01 DIAGNOSIS — R278 Other lack of coordination: Secondary | ICD-10-CM | POA: Diagnosis not present

## 2015-10-01 DIAGNOSIS — M6281 Muscle weakness (generalized): Secondary | ICD-10-CM | POA: Diagnosis not present

## 2015-10-24 ENCOUNTER — Ambulatory Visit (INDEPENDENT_AMBULATORY_CARE_PROVIDER_SITE_OTHER): Payer: PPO | Admitting: Podiatry

## 2015-10-24 DIAGNOSIS — M779 Enthesopathy, unspecified: Principal | ICD-10-CM

## 2015-10-24 DIAGNOSIS — M778 Other enthesopathies, not elsewhere classified: Secondary | ICD-10-CM

## 2015-10-24 DIAGNOSIS — M79673 Pain in unspecified foot: Secondary | ICD-10-CM

## 2015-10-24 DIAGNOSIS — M7750 Other enthesopathy of unspecified foot: Secondary | ICD-10-CM

## 2015-10-24 DIAGNOSIS — M67472 Ganglion, left ankle and foot: Secondary | ICD-10-CM

## 2015-10-24 MED ORDER — NONFORMULARY OR COMPOUNDED ITEM: 1.0000 g | Freq: Four times a day (QID) | 2 refills | Status: DC

## 2015-10-25 MED ORDER — BETAMETHASONE SOD PHOS & ACET 6 (3-3) MG/ML IJ SUSP: 12.0000 mg | Freq: Once | INTRAMUSCULAR | Status: DC

## 2015-10-25 NOTE — Progress Notes (Signed)
Subjective:  Patient presents today for treatment and evaluation of bilateral foot pain. He does state of the left foot is more symptomatic than the right. Patient presents today for further treatment and evaluation    Objective: Physical Exam General: The patient is alert and oriented x3 in no acute distress.  Dermatology: Skin is warm, dry and supple bilateral lower extremities. Negative for open lesions or macerations.  Vascular: Palpable pedal pulses bilaterally. No edema or erythema noted. Capillary refill within normal limits.  Neurological: Epicritic and protective threshold grossly intact bilaterally.   Musculoskeletal Exam: Pain on palpation to the bilateral mid-feet. More symptomatic on the left. Range of motion within normal limits to all pedal and ankle joints bilateral. Muscle strength 5/5 in all groups bilateral.   Assessment: #1 midfoot capsulitis bilateral: More symptomatic on the left. #2 pain in bilateral feet. 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     Plan of Care:  #1 Patient was evaluated. #2 Injection of 0.5 mL Celestone Soluspan injected in the patient's left midfoot Lisfranc joint. #3 prescription for anti-inflammatory pain cream was dispensed through [facility]. #4 patient is to return to clinic in 2 weeks   Dr. Edrick Kins, [place]

## 2015-10-27 ENCOUNTER — Telehealth: Payer: Self-pay | Admitting: *Deleted

## 2015-10-27 NOTE — Telephone Encounter (Signed)
Dr.Evans ordered [employer] Achilles tendonitis cream.  Faxed to [facility].

## 2015-10-29 DIAGNOSIS — Z23 Encounter for immunization: Secondary | ICD-10-CM | POA: Diagnosis not present

## 2015-11-04 DIAGNOSIS — Z1283 Encounter for screening for malignant neoplasm of skin: Secondary | ICD-10-CM | POA: Diagnosis not present

## 2015-11-04 DIAGNOSIS — L570 Actinic keratosis: Secondary | ICD-10-CM | POA: Diagnosis not present

## 2015-11-04 DIAGNOSIS — L820 Inflamed seborrheic keratosis: Secondary | ICD-10-CM | POA: Diagnosis not present

## 2015-11-04 DIAGNOSIS — L812 Freckles: Secondary | ICD-10-CM | POA: Diagnosis not present

## 2015-11-04 DIAGNOSIS — D692 Other nonthrombocytopenic purpura: Secondary | ICD-10-CM | POA: Diagnosis not present

## 2015-11-04 DIAGNOSIS — Z85828 Personal history of other malignant neoplasm of skin: Secondary | ICD-10-CM | POA: Diagnosis not present

## 2015-11-04 DIAGNOSIS — L578 Other skin changes due to chronic exposure to nonionizing radiation: Secondary | ICD-10-CM | POA: Diagnosis not present

## 2015-11-04 DIAGNOSIS — L821 Other seborrheic keratosis: Secondary | ICD-10-CM | POA: Diagnosis not present

## 2015-11-07 ENCOUNTER — Ambulatory Visit (INDEPENDENT_AMBULATORY_CARE_PROVIDER_SITE_OTHER): Payer: PPO | Admitting: Podiatry

## 2015-11-07 ENCOUNTER — Encounter: Payer: Self-pay | Admitting: Podiatry

## 2015-11-07 DIAGNOSIS — M779 Enthesopathy, unspecified: Principal | ICD-10-CM

## 2015-11-07 DIAGNOSIS — M7750 Other enthesopathy of unspecified foot: Secondary | ICD-10-CM

## 2015-11-07 DIAGNOSIS — M67472 Ganglion, left ankle and foot: Secondary | ICD-10-CM

## 2015-11-07 DIAGNOSIS — M79672 Pain in left foot: Secondary | ICD-10-CM | POA: Diagnosis not present

## 2015-11-07 DIAGNOSIS — M778 Other enthesopathies, not elsewhere classified: Secondary | ICD-10-CM

## 2015-11-09 NOTE — Progress Notes (Signed)
Subjective:  Patient presents today for treatment and evaluation of bilateral foot pain. He does state that the anti-inflammatory pain cream through [facility] does help. Patient is still more symptomatic on the left. Patient presents today for further treatment and evaluation    Objective/Physical Exam General: The patient is alert and oriented x3 in no acute distress.  Dermatology: Skin is warm, dry and supple bilateral lower extremities. Negative for open lesions or macerations.  Vascular: Palpable pedal pulses bilaterally. No edema or erythema noted. Capillary refill within normal limits.  Neurological: Epicritic and protective threshold grossly intact bilaterally.   Musculoskeletal Exam: Range of motion within normal limits to all pedal and ankle joints bilateral. Muscle strength 5/5 in all groups bilateral.   Radiographic Exam:  Normal osseous mineralization. Joint spaces preserved. No fracture/dislocation/boney destruction.    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 #2 today the patient was evaluated and he states that the anti-inflammatory pain cream through [facility] has helped. #3 patient is to return to clinic when necessary  Dr. Edrick Kins, [place]

## 2016-01-22 ENCOUNTER — Telehealth: Payer: Self-pay | Admitting: Urology

## 2016-01-22 DIAGNOSIS — N200 Calculus of kidney: Secondary | ICD-10-CM

## 2016-01-22 NOTE — Telephone Encounter (Signed)
Patient will need a KUB prior to his follow up appt with dr. Erlene Quan on 04-02-16 so I will need an order put in for this please and thank you.   Sharyn Lull

## 2016-01-29 DIAGNOSIS — Z933 Colostomy status: Secondary | ICD-10-CM | POA: Diagnosis not present

## 2016-02-05 ENCOUNTER — Ambulatory Visit: Payer: PPO | Admitting: Podiatry

## 2016-02-06 ENCOUNTER — Ambulatory Visit (INDEPENDENT_AMBULATORY_CARE_PROVIDER_SITE_OTHER): Payer: PPO

## 2016-02-06 ENCOUNTER — Encounter: Payer: Self-pay | Admitting: Podiatry

## 2016-02-06 ENCOUNTER — Ambulatory Visit (INDEPENDENT_AMBULATORY_CARE_PROVIDER_SITE_OTHER): Payer: PPO | Admitting: Podiatry

## 2016-02-06 DIAGNOSIS — M7752 Other enthesopathy of left foot: Secondary | ICD-10-CM

## 2016-02-06 DIAGNOSIS — M778 Other enthesopathies, not elsewhere classified: Secondary | ICD-10-CM

## 2016-02-06 DIAGNOSIS — M779 Enthesopathy, unspecified: Secondary | ICD-10-CM

## 2016-02-06 DIAGNOSIS — M79672 Pain in left foot: Secondary | ICD-10-CM

## 2016-02-06 DIAGNOSIS — M67472 Ganglion, left ankle and foot: Secondary | ICD-10-CM

## 2016-02-06 DIAGNOSIS — G5792 Unspecified mononeuropathy of left lower limb: Secondary | ICD-10-CM

## 2016-02-11 MED ORDER — BETAMETHASONE SOD PHOS & ACET 6 (3-3) MG/ML IJ SUSP: 3.0000 mg | Freq: Once | INTRAMUSCULAR | Status: DC

## 2016-02-11 NOTE — Progress Notes (Signed)
Subjective:  Patient presents today for treatment and evaluation of bilateral foot pain. He does state that the anti-inflammatory pain cream through [facility] [facility] [employer] does help. Patient is still more symptomatic on the left. Patient presents today for further treatment and evaluation Patient states that the orthotics that he wears does not help    Objective/Physical Exam General: The patient is alert and oriented x3 in no acute distress.  Dermatology: Skin is warm, dry and supple bilateral lower extremities. Negative for open lesions or macerations.  Vascular: Palpable pedal pulses bilaterally. No edema or erythema noted. Capillary refill within normal limits.  Neurological: Epicritic and protective threshold grossly intact bilaterally.   Musculoskeletal Exam: Range of motion within normal limits to all pedal and ankle joints bilateral. Muscle strength 5/5 in all groups bilateral.   Radiographic Exam:  Normal osseous mineralization. Joint spaces preserved. No fracture/dislocation/boney destruction.    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 #2 injection of 0.5 mL Celestone Soluspan injected into the left midfoot/Lisfranc joint #3 today we did discuss conservative versus surgical management regarding his chronic ganglionic cyst the left foot as well as underlying exostosis and left foot neuritis. Patient opts conservative management at the moment. #4 return to clinic in 4 weeks  Dr. Edrick Kins, [place]

## 2016-03-30 ENCOUNTER — Ambulatory Visit
Admission: RE | Admit: 2016-03-30 | Discharge: 2016-03-30 | Disposition: A | Discharge status: Home / Self Care | Payer: PPO | Source: Ambulatory Visit | Attending: Urology | Admitting: Urology

## 2016-03-30 DIAGNOSIS — N200 Calculus of kidney: Secondary | ICD-10-CM | POA: Diagnosis not present

## 2016-04-02 ENCOUNTER — Ambulatory Visit: Payer: PPO | Admitting: Urology

## 2016-04-02 ENCOUNTER — Encounter: Payer: Self-pay | Admitting: Urology

## 2016-04-02 VITALS — BP 159/65 | HR 71 | Ht 72.0 in | Wt 168.0 lb

## 2016-04-02 DIAGNOSIS — N138 Other obstructive and reflux uropathy: Secondary | ICD-10-CM

## 2016-04-02 DIAGNOSIS — N401 Enlarged prostate with lower urinary tract symptoms: Secondary | ICD-10-CM | POA: Diagnosis not present

## 2016-04-02 DIAGNOSIS — Z87442 Personal history of urinary calculi: Secondary | ICD-10-CM

## 2016-04-02 DIAGNOSIS — N200 Calculus of kidney: Secondary | ICD-10-CM | POA: Diagnosis not present

## 2016-04-02 DIAGNOSIS — R339 Retention of urine, unspecified: Secondary | ICD-10-CM | POA: Diagnosis not present

## 2016-04-02 LAB — BLADDER SCAN AMB NON-IMAGING

## 2016-04-02 NOTE — Progress Notes (Signed)
04/02/2016 1:14 PM   Todd Bennett 1932-09-16 [id number]  Referring provider: [name], MD [phone] [place], [place] [address]  Chief Complaint  Patient presents with  . Benign Prostatic Hypertrophy    New Patient  . Nephrolithiasis    HPI:  A 83 year old male who returned today for routine annual follow-up. Overall, he's been doing well over the past here.  BPH/ incomplete bladder emptying No UTIs  Remote history of bladder stones in '86. History of post op urinary retention. Currently on Flomax and finasteride.   PVR today ~200 which is slightly increased from previous visit at 150 Cr stable, normal, 08/2015 0.9 No voiding complaints today other than nocturia x 2, IPSS below  History of nephrolithiasis H/o calcium oxalate monohydrate/ Ca Phosphate stones H/o ilostomy, struggles with dehydration at times Pre2015 multiple recurrent stones  2015 left ESWL followed by left ureteroscopy by Dr. Elnoria Howard for 6 mm ureteral stone No recent flank pain or any stone episodes since last visit KUB today with punctate bilateral stones, stable       IPSS    Row Name 04/02/16 0900         International Prostate Symptom Score   How often have you had the sensation of not emptying your bladder? Less than half the time     How often have you had to urinate less than every two hours? More than half the time     How often have you found you stopped and started again several times when you urinated? Less than 1 in 5 times     How often have you found it difficult to postpone urination? About half the time     How often have you had a weak urinary stream? Less than half the time     How often have you had to strain to start urination? Less than 1 in 5 times     How many times did you typically get up at night to urinate? 2 Times     Total IPSS Score 15       Quality of Life due to urinary symptoms   If you were to spend the rest of your life with your urinary condition just the  way it is now how would you feel about that? Mostly Satisfied        Score:  1-7 Mild 8-19 Moderate 20-35 Severe   PMH: Past Medical History:  Diagnosis Date  . Anxiety 06/17/2014  . Benign prostatic hyperplasia with urinary obstruction 06/17/2014  . Benign prostatic hypertrophy without urinary obstruction 06/20/2013  . Bladder calculi 04/26/2013  . Calculi, ureter 03/29/2013  . Calculus of kidney 02/25/2013  . Clinical depression 06/17/2014  . Frank hematuria 06/28/2013  . HLD (hyperlipidemia) 06/17/2014    Surgical History: Past Surgical History:  Procedure Laterality Date  . HERNIA REPAIR  1993  . ILEOSTOMY    . LITHOTRIPSY  03/2013    Home Medications:  Allergies as of 04/02/2016      Reactions   Ciprofloxacin    Clarithromycin    Oxycodone    Penicillins    Sulfur       Medication List       Accurate as of 04/02/16  1:14 PM. Always use your most recent med list.          ACZONE 5 % topical gel Generic drug:  Dapsone   aspirin EC 81 MG tablet Take by mouth.   CoQ-10 200 MG Caps Take  200 mg by mouth.   finasteride 5 MG tablet Commonly known as:  PROSCAR TAKE 1 TABLET EVERY DAY   Fish Oil 1000 MG Caps Take by mouth.   FLUoxetine 20 MG capsule Commonly known as:  PROZAC Take 20 mg by mouth.   metroNIDAZOLE 1 % gel Commonly known as:  METROGEL Apply topically.   MULTI-VITAMINS Tabs Take by mouth.   NONFORMULARY OR COMPOUNDED ITEM Apply 1-2 g topically 4 (four) times daily.   tamsulosin 0.4 MG Caps capsule Commonly known as:  FLOMAX Take 0.4 mg by mouth.   venlafaxine XR 150 MG 24 hr capsule Commonly known as:  EFFEXOR-XR Take 150 mg by mouth.   vitamin C 1000 MG tablet Take 1,000 mg by mouth.   VITAMIN D-1000 MAX ST 1000 units tablet Generic drug:  Cholecalciferol Take by mouth.       Allergies:  Allergies  Allergen Reactions  . Ciprofloxacin   . Clarithromycin   . Oxycodone   . Penicillins   . Sulfur     Family  History: Family History  Problem Relation Age of Onset  . Diabetes Mother   . Prostate cancer Neg Hx   . Bladder Cancer Neg Hx     Social History:  reports that he has quit smoking. He has never used smokeless tobacco. He reports that he does not drink alcohol or use drugs.  ROS: UROLOGY Frequent Urination?: No Hard to postpone urination?: Yes Burning/pain with urination?: No Get up at night to urinate?: Yes Leakage of urine?: No Urine stream starts and stops?: Yes Trouble starting stream?: No Do you have to strain to urinate?: No Blood in urine?: No Urinary tract infection?: No Sexually transmitted disease?: No Injury to kidneys or bladder?: No Painful intercourse?: No Weak stream?: No Erection problems?: Yes Penile pain?: No  Gastrointestinal Nausea?: No Vomiting?: No Indigestion/heartburn?: No Diarrhea?: No Constipation?: No  Constitutional Fever: No Night sweats?: No Weight loss?: No Fatigue?: Yes  Skin Skin rash/lesions?: No Itching?: No  Eyes Blurred vision?: No Double vision?: No  Ears/Nose/Throat Sore throat?: No Sinus problems?: No  Hematologic/Lymphatic Swollen glands?: No Easy bruising?: No  Cardiovascular Leg swelling?: No Chest pain?: No  Respiratory Cough?: No Shortness of breath?: No  Endocrine Excessive thirst?: No  Musculoskeletal Back pain?: No Joint pain?: Yes  Neurological Headaches?: No Dizziness?: No  Psychologic Depression?: No Anxiety?: Yes  Physical Exam: BP (!) 159/65   Pulse 71   Ht 6' (1.829 m)   Wt 168 lb (76.2 kg)   BMI 22.78 kg/m   Constitutional:  Alert and oriented, No acute distress. HEENT: [place] AT, moist mucus membranes.  Trachea midline, no masses. Cardiovascular: No clubbing, cyanosis, or edema. Respiratory: Normal respiratory effort, no increased work of breathing. GI: Abdomen is soft, nontender, nondistended, no abdominal masses GU: No CVA tenderness.  Skin: No rashes, bruises or  suspicious lesions. Neurologic: Grossly intact, no focal deficits, moving all 4 extremities. Psychiatric: Normal mood and affect.  Laboratory Data: Lab Results  Component Value Date   WBC 9.9 02/20/2013   HGB 16.9 02/20/2013   HCT 50.4 02/20/2013   MCV 87 02/20/2013   PLT 255 02/20/2013     PSA 03/12/15 0.25, stable from 0.29 on 11/25  Urinalysis N/a   Pertinent Imaging: Results for orders placed or performed in visit on 04/02/16  BLADDER SCAN AMB NON-IMAGING  Result Value Ref Range   Scan Result 207lb     Assessment & Plan:    1. BPH (benign  prostatic hyperplasia) Continue finasteride and flomax PSA previously low, given age will defer further labs - Urinalysis, Complete - BLADDER SCAN AMB NON-IMAGING  2. Incomplete bladder emptying PVR today borderline but no sequela (normal Cr, no bladder stones, no UTIs) Continue to monitor with annual PVR, return sooner if unable to void  3. History of nephrolithiasis KUB stable today x 2 years Continue to encourage hydration Return sooner if develops flank pain F/u symptomatically next year, will defer KUB since stable x 2 years   Return in about 1 year (around 04/02/2017) for IPSS, PVR, kidney stone recheck.  Hollice Espy, MD  [facility] [address], [place], [place] [address] [phone]

## 2016-04-16 ENCOUNTER — Other Ambulatory Visit: Payer: Self-pay | Admitting: Urology

## 2016-04-16 DIAGNOSIS — N401 Enlarged prostate with lower urinary tract symptoms: Secondary | ICD-10-CM

## 2016-04-17 ENCOUNTER — Other Ambulatory Visit: Payer: Self-pay | Admitting: Urology

## 2016-05-04 DIAGNOSIS — L719 Rosacea, unspecified: Secondary | ICD-10-CM | POA: Diagnosis not present

## 2016-05-04 DIAGNOSIS — Z85828 Personal history of other malignant neoplasm of skin: Secondary | ICD-10-CM | POA: Diagnosis not present

## 2016-05-04 DIAGNOSIS — L578 Other skin changes due to chronic exposure to nonionizing radiation: Secondary | ICD-10-CM | POA: Diagnosis not present

## 2016-05-04 DIAGNOSIS — L820 Inflamed seborrheic keratosis: Secondary | ICD-10-CM | POA: Diagnosis not present

## 2016-05-04 DIAGNOSIS — L570 Actinic keratosis: Secondary | ICD-10-CM | POA: Diagnosis not present

## 2016-05-06 DIAGNOSIS — H18421 Band keratopathy, right eye: Secondary | ICD-10-CM | POA: Diagnosis not present

## 2016-05-31 DIAGNOSIS — Z933 Colostomy status: Secondary | ICD-10-CM | POA: Diagnosis not present

## 2016-06-25 DIAGNOSIS — Z13220 Encounter for screening for lipoid disorders: Secondary | ICD-10-CM | POA: Diagnosis not present

## 2016-06-25 DIAGNOSIS — Z0000 Encounter for general adult medical examination without abnormal findings: Secondary | ICD-10-CM | POA: Diagnosis not present

## 2016-06-25 DIAGNOSIS — Z125 Encounter for screening for malignant neoplasm of prostate: Secondary | ICD-10-CM | POA: Diagnosis not present

## 2016-07-02 DIAGNOSIS — N401 Enlarged prostate with lower urinary tract symptoms: Secondary | ICD-10-CM | POA: Diagnosis not present

## 2016-07-02 DIAGNOSIS — E785 Hyperlipidemia, unspecified: Secondary | ICD-10-CM | POA: Diagnosis not present

## 2016-07-02 DIAGNOSIS — F419 Anxiety disorder, unspecified: Secondary | ICD-10-CM | POA: Diagnosis not present

## 2016-07-02 DIAGNOSIS — Z0000 Encounter for general adult medical examination without abnormal findings: Secondary | ICD-10-CM | POA: Diagnosis not present

## 2016-07-26 DIAGNOSIS — L820 Inflamed seborrheic keratosis: Secondary | ICD-10-CM | POA: Diagnosis not present

## 2016-07-26 DIAGNOSIS — L821 Other seborrheic keratosis: Secondary | ICD-10-CM | POA: Diagnosis not present

## 2016-07-26 DIAGNOSIS — L578 Other skin changes due to chronic exposure to nonionizing radiation: Secondary | ICD-10-CM | POA: Diagnosis not present

## 2016-07-26 DIAGNOSIS — Z85828 Personal history of other malignant neoplasm of skin: Secondary | ICD-10-CM | POA: Diagnosis not present

## 2016-07-26 DIAGNOSIS — D485 Neoplasm of uncertain behavior of skin: Secondary | ICD-10-CM | POA: Diagnosis not present

## 2016-07-26 DIAGNOSIS — D044 Carcinoma in situ of skin of scalp and neck: Secondary | ICD-10-CM | POA: Diagnosis not present

## 2016-07-26 DIAGNOSIS — L570 Actinic keratosis: Secondary | ICD-10-CM | POA: Diagnosis not present

## 2016-07-26 DIAGNOSIS — D0439 Carcinoma in situ of skin of other parts of face: Secondary | ICD-10-CM | POA: Diagnosis not present

## 2016-08-05 DIAGNOSIS — M5442 Lumbago with sciatica, left side: Secondary | ICD-10-CM | POA: Diagnosis not present

## 2016-08-05 DIAGNOSIS — M4316 Spondylolisthesis, lumbar region: Secondary | ICD-10-CM | POA: Diagnosis not present

## 2016-08-05 DIAGNOSIS — M5441 Lumbago with sciatica, right side: Secondary | ICD-10-CM | POA: Diagnosis not present

## 2016-08-05 DIAGNOSIS — M5137 Other intervertebral disc degeneration, lumbosacral region: Secondary | ICD-10-CM | POA: Diagnosis not present

## 2016-08-16 DIAGNOSIS — M4316 Spondylolisthesis, lumbar region: Secondary | ICD-10-CM | POA: Diagnosis not present

## 2016-08-16 DIAGNOSIS — M5137 Other intervertebral disc degeneration, lumbosacral region: Secondary | ICD-10-CM | POA: Diagnosis not present

## 2016-09-06 ENCOUNTER — Other Ambulatory Visit: Payer: Self-pay | Admitting: Physician Assistant

## 2016-09-06 DIAGNOSIS — M5431 Sciatica, right side: Secondary | ICD-10-CM

## 2016-09-06 DIAGNOSIS — M5432 Sciatica, left side: Principal | ICD-10-CM

## 2016-09-08 ENCOUNTER — Ambulatory Visit
Admission: RE | Admit: 2016-09-08 | Discharge: 2016-09-08 | Disposition: A | Discharge status: Home / Self Care | Payer: PPO | Source: Ambulatory Visit | Attending: Physician Assistant | Admitting: Physician Assistant

## 2016-09-08 DIAGNOSIS — M48061 Spinal stenosis, lumbar region without neurogenic claudication: Secondary | ICD-10-CM | POA: Insufficient documentation

## 2016-09-08 DIAGNOSIS — M5431 Sciatica, right side: Secondary | ICD-10-CM | POA: Diagnosis not present

## 2016-09-08 DIAGNOSIS — M545 Low back pain: Secondary | ICD-10-CM | POA: Diagnosis not present

## 2016-09-08 DIAGNOSIS — M5432 Sciatica, left side: Secondary | ICD-10-CM | POA: Diagnosis not present

## 2016-09-15 ENCOUNTER — Ambulatory Visit
Payer: PPO | Attending: Student in an Organized Health Care Education/Training Program | Admitting: Student in an Organized Health Care Education/Training Program

## 2016-09-15 ENCOUNTER — Encounter: Payer: Self-pay | Admitting: Student in an Organized Health Care Education/Training Program

## 2016-09-15 VITALS — BP 149/70 | HR 62 | Temp 98.0°F | Resp 16 | Ht 72.0 in | Wt 165.0 lb

## 2016-09-15 DIAGNOSIS — Z9889 Other specified postprocedural states: Secondary | ICD-10-CM | POA: Diagnosis not present

## 2016-09-15 DIAGNOSIS — M25552 Pain in left hip: Secondary | ICD-10-CM | POA: Insufficient documentation

## 2016-09-15 DIAGNOSIS — Z885 Allergy status to narcotic agent status: Secondary | ICD-10-CM | POA: Insufficient documentation

## 2016-09-15 DIAGNOSIS — Z833 Family history of diabetes mellitus: Secondary | ICD-10-CM | POA: Diagnosis not present

## 2016-09-15 DIAGNOSIS — Z882 Allergy status to sulfonamides status: Secondary | ICD-10-CM | POA: Diagnosis not present

## 2016-09-15 DIAGNOSIS — Z881 Allergy status to other antibiotic agents status: Secondary | ICD-10-CM | POA: Diagnosis not present

## 2016-09-15 DIAGNOSIS — G8929 Other chronic pain: Secondary | ICD-10-CM | POA: Insufficient documentation

## 2016-09-15 DIAGNOSIS — Z87442 Personal history of urinary calculi: Secondary | ICD-10-CM | POA: Diagnosis not present

## 2016-09-15 DIAGNOSIS — E785 Hyperlipidemia, unspecified: Secondary | ICD-10-CM | POA: Insufficient documentation

## 2016-09-15 DIAGNOSIS — Z87891 Personal history of nicotine dependence: Secondary | ICD-10-CM | POA: Diagnosis not present

## 2016-09-15 DIAGNOSIS — M4690 Unspecified inflammatory spondylopathy, site unspecified: Secondary | ICD-10-CM | POA: Diagnosis not present

## 2016-09-15 DIAGNOSIS — Z880 Allergy status to penicillin: Secondary | ICD-10-CM | POA: Diagnosis not present

## 2016-09-15 DIAGNOSIS — H269 Unspecified cataract: Secondary | ICD-10-CM | POA: Insufficient documentation

## 2016-09-15 DIAGNOSIS — M25561 Pain in right knee: Secondary | ICD-10-CM | POA: Insufficient documentation

## 2016-09-15 DIAGNOSIS — M47816 Spondylosis without myelopathy or radiculopathy, lumbar region: Secondary | ICD-10-CM | POA: Insufficient documentation

## 2016-09-15 DIAGNOSIS — M25562 Pain in left knee: Secondary | ICD-10-CM | POA: Insufficient documentation

## 2016-09-15 DIAGNOSIS — M5136 Other intervertebral disc degeneration, lumbar region: Secondary | ICD-10-CM | POA: Insufficient documentation

## 2016-09-15 DIAGNOSIS — Z7982 Long term (current) use of aspirin: Secondary | ICD-10-CM | POA: Diagnosis not present

## 2016-09-15 DIAGNOSIS — M47819 Spondylosis without myelopathy or radiculopathy, site unspecified: Secondary | ICD-10-CM

## 2016-09-15 DIAGNOSIS — M25551 Pain in right hip: Secondary | ICD-10-CM | POA: Diagnosis not present

## 2016-09-15 DIAGNOSIS — Z932 Ileostomy status: Secondary | ICD-10-CM | POA: Diagnosis not present

## 2016-09-15 NOTE — Progress Notes (Signed)
Safety precautions to be maintained throughout the outpatient stay will include: orient to surroundings, keep bed in low position, maintain call bell within reach at all times, provide assistance with transfer out of bed and ambulation.  

## 2016-09-15 NOTE — Progress Notes (Signed)
Patient's Name: Todd Bennett  MRN: [id number]  Referring Provider: Watt Climes, [place]  DOB: Mar 25, 1932  PCP: [name], MD  DOS: 09/15/2016  Note by: Gillis Santa, MD  Service setting: Ambulatory outpatient  Specialty: Interventional Pain Management  Location: [facility] (AMB) Pain Management Facility    Patient type: New patient ("FAST-TRACK" Evaluation)   Warning: This referral option does not include the extensive pharmacological evaluation required for [place] to take over the patient's medication management. The "Fast-Track" system is designed to bypass the new patient referral waiting list, as well as the normal patient evaluation process, in order to provide a patient in distress with a timely pain management intervention. Because the system was not designed to unfairly get a patient into our pain practice ahead of those already waiting, certain restrictions apply. By requesting a "Fast-Track" consult, the referring physician has opted to continue managing the patient's medications in order to get interventional urgent care.  Primary Reason for Visit: Interventional Pain Management Treatment. CC: Back Pain (lower)   Procedure  HPI  Todd Bennett is a 83 y.o. year old, male patient, who comes today for a  "Fast-Track" new patient evaluation, as requested by Watt Climes, PA. The patient has been made aware that this type of referral option is reserved for the Interventional Pain Management portion of our practice and completely excludes the option of medication management. His primarily concern today is the Back Pain (lower)  1. Lumbar spondylosis   2. Lumbar degenerative disc disease   3. Facet arthropathy ([place])   4. Chronic arthralgias of knees and hips      Pain Assessment: Location: Lower Back Radiating: down the back of both legs to the knees Onset: More than a month ago Duration: Chronic pain Quality: Constant, Aching, Radiating, Sharp, Shooting (shooting pain in the leg; more pain when  up moving) Severity: 9 /10 (self-reported pain score)  Note: Reported level is compatible with observation.                   Effect on ADL: pace self Timing: Constant Modifying factors: medicine (advil) arthritis tylenol  Onset and Duration: Gradual Cause of pain: Arthritis Severity: Getting worse Timing: Morning and After a period of immobility Aggravating Factors: Bending, Lifiting, Prolonged standing, Twisting and Walking uphill Alleviating Factors: Medications, Resting, Sitting, Sleeping and Relaxation therapy Associated Problems: Day-time cramps and Pain that wakes patient up Quality of Pain: Aching, Constant, Cramping, Disabling, Distressing, Tender and Throbbing Previous Examinations or Tests: MRI scan, X-rays and Orthopedic evaluation Previous Treatments: Physical Therapy and Relaxation therapy  The patient comes into the clinics today, referred to [place] for a chronic axial low back pain that is worsened over time secondary to multilevel lumbar degenerative disc disease, advanced lumbar spondylosis at L3-L5 along with facet arthropathy and hypertrophy most pronounced at L3-L4 and L4-L5.  Meds   Current Meds  Medication Sig  . Ascorbic Acid (VITAMIN C) 1000 MG tablet Take 1,000 mg by mouth.  Marland Kitchen aspirin EC 81 MG tablet daily.   . Cholecalciferol (VITAMIN D-1000 MAX ST) 1000 UNITS tablet Take 1,000 Units by mouth daily.   . finasteride (PROSCAR) 5 MG tablet TAKE 1 TABLET EVERY DAY  . FLUoxetine (PROZAC) 20 MG capsule Take 30 mg by mouth daily.   . metroNIDAZOLE (METROGEL) 0.75 % gel Apply 1 application topically daily.   . Multiple Vitamin (MULTI-VITAMINS) TABS Take by mouth daily.   . NONFORMULARY OR COMPOUNDED ITEM Apply 1-2 g topically 4 (four)  times daily. (Patient taking differently: Apply 1-2 g topically as needed. )  . Omega-3 Fatty Acids (FISH OIL) 1000 MG CAPS Take by mouth daily.   . prednisoLONE acetate (PRED FORTE) 1 % ophthalmic suspension Place 1 drop into the right  eye daily.  . tamsulosin (FLOMAX) 0.4 MG CAPS capsule Take 0.4 mg by mouth daily.   Marland Kitchen venlafaxine XR (EFFEXOR-XR) 150 MG 24 hr capsule Take 150 mg by mouth daily with breakfast.    Current Facility-Administered Medications for the 09/15/16 encounter (Office Visit) with Gillis Santa, MD  Medication  . betamethasone acetate-betamethasone sodium phosphate (CELESTONE) injection 12 mg  . betamethasone acetate-betamethasone sodium phosphate (CELESTONE) injection 3 mg    Imaging Review   Lumbosacral Imaging: Lumbar MR wo contrast:  Results for orders placed during the hospital encounter of 09/08/16  MR LUMBAR SPINE WO CONTRAST   Narrative CLINICAL DATA:  Low back pain.  Bilateral leg pain.  EXAM: MRI LUMBAR SPINE WITHOUT CONTRAST  TECHNIQUE: Multiplanar, multisequence MR imaging of the lumbar spine was performed. No intravenous contrast was administered.  COMPARISON:  None.  FINDINGS: Segmentation:  Standard.  Alignment:  Grade 1 anterolisthesis at L4-5, facet mediated  Vertebrae:  No fracture, evidence of discitis, or bone lesion.  Conus medullaris: Extends to the L1 level and appears normal.  Paraspinal and other soft tissues: Negative  Disc levels:  T12- L1: Unremarkable.  L1-L2: Degenerative disc narrowing and desiccation. Tiny posterior annular fissure is likely. No impingement  L2-L3: Mild facet spurring. Unremarkable disc for age. No impingement  L3-L4: Mild facet spurring. Disc bulging preferentially towards the left foramen. Ventral thecal sac flattening without compressive stenosis.  L4-L5: Advanced facet arthropathy with joint distortion, spurring, ligament thickening, and right-sided effusion. Grade 1 anterolisthesis. The uncovered disc is bulging with an up turning central protrusion. Advanced spinal stenosis. Moderate right foraminal narrowing.  L5-S1:Disc degeneration with moderate narrowing. Far-lateral endplate spurs, greater towards the right.  Mild facet spurring. The right L5 nerve is displaced by endplate spurring.  IMPRESSION: 1. L4-5 advanced spinal stenosis primarily due to facet arthropathy with hypertrophy and anterolisthesis. Moderate right foraminal narrowing. 2. L5-S1 right L5 foraminal impingement due to endplate spur. 3. Noncompressive degenerative changes described above.   Electronically Signed   By: Monte Fantasia M.D.   On: 09/09/2016 07:42    Complexity Note: Imaging results reviewed. Results discussed using Layman's terms.               ROS  Cardiovascular History: Daily Aspirin intake Pulmonary or Respiratory History: Shortness of breath Neurological History: No reported neurological signs or symptoms such as seizures, abnormal skin sensations, urinary and/or fecal incontinence, being born with an abnormal open spine and/or a tethered spinal cord Review of Past Neurological Studies: No results found for this or any previous visit. Psychological-Psychiatric History: Difficulty sleeping and or falling asleep Gastrointestinal History: Reflux or heatburn Genitourinary History: No reported renal or genitourinary signs or symptoms such as difficulty voiding or producing urine, peeing blood, non-functioning kidney, kidney stones, difficulty emptying the bladder, difficulty controlling the flow of urine, or chronic kidney disease Hematological History: No reported hematological signs or symptoms such as prolonged bleeding, low or poor functioning platelets, bruising or bleeding easily, hereditary bleeding problems, low energy levels due to low hemoglobin or being anemic Endocrine History: No reported endocrine signs or symptoms such as high or low blood sugar, rapid heart rate due to high thyroid levels, obesity or weight gain due to slow thyroid or thyroid disease  Rheumatologic History: Joint aches and or swelling due to excess weight (Osteoarthritis) Musculoskeletal History: Negative for myasthenia gravis,  muscular dystrophy, multiple sclerosis or malignant hyperthermia Work History: Retired  Allergies  Mr. Seydel is allergic to ciprofloxacin; clarithromycin; oxycodone; penicillins; and sulfur.  Laboratory Chemistry  Inflammation Markers (CRP: Acute Phase) (ESR: Chronic Phase) No results found for: CRP, ESRSEDRATE               Renal Function Markers Lab Results  Component Value Date   BUN 21 (H) 02/20/2013   CREATININE 1.05 02/20/2013   GFRAA >60 02/20/2013   GFRNONAA >60 02/20/2013                 Hepatic Function Markers Lab Results  Component Value Date   AST 38 (H) 02/20/2013   ALT 62 02/20/2013   ALBUMIN 3.8 02/20/2013   ALKPHOS 121 (H) 02/20/2013                 Electrolytes Lab Results  Component Value Date   NA 135 (L) 02/20/2013   K 3.9 02/20/2013   CL 105 02/20/2013   CALCIUM 9.2 02/20/2013                 Neuropathy Markers No results found for: [id number]               Bone Pathology Markers Lab Results  Component Value Date   ALKPHOS 121 (H) 02/20/2013   CALCIUM 9.2 02/20/2013                 Coagulation Parameters Lab Results  Component Value Date   PLT 255 02/20/2013                 Cardiovascular Markers Lab Results  Component Value Date   HGB 16.9 02/20/2013   HCT 50.4 02/20/2013                 Note: Lab results reviewed.  [place]  Drug: Mr. Sliwa  reports that he does not use drugs. Alcohol:  reports that he does not drink alcohol. Tobacco:  reports that he has quit smoking. He has never used smokeless tobacco. Medical:  has a past medical history of Allergy; Anxiety (06/17/2014); Benign prostatic hyperplasia with urinary obstruction (06/17/2014); Benign prostatic hypertrophy without urinary obstruction (06/20/2013); Bladder calculi (04/26/2013); Calculi, ureter (03/29/2013); Calculus of kidney (02/25/2013); Cataract; Clinical depression (06/17/2014); Degenerative disc disease, lumbar; Frank hematuria (06/28/2013); HLD (hyperlipidemia) (06/17/2014);  and Spondylolysis. Family: family history includes Diabetes in his mother.  Past Surgical History:  Procedure Laterality Date  . HERNIA REPAIR  1993  . ILEOSTOMY    . LITHOTRIPSY  03/2013   Active Ambulatory Problems    Diagnosis Date Noted  . Anxiety 06/17/2014  . Benign prostatic hyperplasia with urinary obstruction 06/17/2014  . Benign prostatic hypertrophy without urinary obstruction 06/20/2013  . Bladder calculi 04/26/2013  . Calculus of kidney 02/25/2013  . Calculi, ureter 03/29/2013  . Clinical depression 06/17/2014  . Frank hematuria 06/28/2013  . HLD (hyperlipidemia) 06/17/2014   Resolved Ambulatory Problems    Diagnosis Date Noted  . No Resolved Ambulatory Problems   Past Medical History:  Diagnosis Date  . Allergy   . Anxiety 06/17/2014  . Benign prostatic hyperplasia with urinary obstruction 06/17/2014  . Benign prostatic hypertrophy without urinary obstruction 06/20/2013  . Bladder calculi 04/26/2013  . Calculi, ureter 03/29/2013  . Calculus of kidney 02/25/2013  . Cataract   . Clinical depression 06/17/2014  . Degenerative  disc disease, lumbar   . Frank hematuria 06/28/2013  . HLD (hyperlipidemia) 06/17/2014  . Spondylolysis    Constitutional Exam  General appearance: Well nourished, well developed, and well hydrated. In no apparent acute distress Vitals:   09/15/16 1020  BP: (!) 149/70  Pulse: 62  Resp: 16  Temp: 98 F (36.7 C)  SpO2: 96%  Weight: 165 lb (74.8 kg)  Height: 6' (1.829 m)   BMI Assessment: Estimated body mass index is 22.38 kg/m as calculated from the following:   Height as of this encounter: 6' (1.829 m).   Weight as of this encounter: 165 lb (74.8 kg).  BMI interpretation table: BMI level Category Range association with higher incidence of chronic pain  <18 kg/m2 Underweight   18.5-24.9 kg/m2 Ideal body weight   25-29.9 kg/m2 Overweight Increased incidence by 20%  30-34.9 kg/m2 Obese (Class I) Increased incidence by 68%  35-39.9 kg/m2  Severe obesity (Class II) Increased incidence by 136%  >40 kg/m2 Extreme obesity (Class III) Increased incidence by 254%   BMI Readings from Last 4 Encounters:  09/15/16 22.38 kg/m  04/02/16 22.78 kg/m  04/02/15 23.43 kg/m   Wt Readings from Last 4 Encounters:  09/15/16 165 lb (74.8 kg)  04/02/16 168 lb (76.2 kg)  04/02/15 168 lb (76.2 kg)  Psych/Mental status: Alert, oriented x 3 (person, place, & time)       Eyes: PERLA Respiratory: No evidence of acute respiratory distress  Lumbar Spine Area Exam  Skin & Axial Inspection: Lumbar Scoliosis Alignment: Scoliosis detected Functional ROM: Decreased ROM      Stability: No instability detected Muscle Tone/Strength: Functionally intact. No obvious neuro-muscular anomalies detected. Sensory (Neurological): Articular pain pattern Palpation: Complains of area being tender to palpation       Provocative Tests: Lumbar Hyperextension and rotation test: Positive       Lumbar Lateral bending test: Positive       Patrick's Maneuver: Positive                    Gait & Posture Assessment  Ambulation: Limited Gait: Relatively normal for age and body habitus Posture: WNL   Lower Extremity Exam    Side: Right lower extremity  Side: Left lower extremity  Skin & Extremity Inspection: Skin color, temperature, and hair growth are WNL. No peripheral edema or cyanosis. No masses, redness, swelling, asymmetry, or associated skin lesions. No contractures.  Skin & Extremity Inspection: Skin color, temperature, and hair growth are WNL. No peripheral edema or cyanosis. No masses, redness, swelling, asymmetry, or associated skin lesions. No contractures.  Functional ROM: Unrestricted ROM          Functional ROM: Unrestricted ROM          Muscle Tone/Strength: Functionally intact. No obvious neuro-muscular anomalies detected.  Muscle Tone/Strength: Functionally intact. No obvious neuro-muscular anomalies detected.  Sensory (Neurological): Unimpaired   Sensory (Neurological): Unimpaired  Palpation: Tender in the area of the posterior thighs bilaterally   Palpation: Tender    Assessment and plan:  1. Lumbar spondylosis   2. Lumbar degenerative disc disease   3. Facet arthropathy ([place])   4. Chronic arthralgias of knees and hips     Orders Placed This Encounter  Procedures  . LUMBAR FACET(MEDIAL BRANCH NERVE BLOCK) MBNB    Standing Status:   Future    Standing Expiration Date:   10/15/2016    Scheduling Instructions:     Side: Bilateral     Level: L3, L4, L5,  Medial Branch Nerve     Sedation: No Sedation.     Timeframe: ASAA    Order Specific Question:   Where will this procedure be performed?    Answer:   [facility]    83 year old male with a past medical history of arthritis, hypertension, hyperlipidemia, lumbar degenerative disc disease who presents with axial low back pain with radiation to his posterior thighs, buttocks down to his knees. Patient's pain does not radiate below the knees. Patient denies any numbness tingling or burning. He describes it as more of an achy throb in his back that extends to his posterior thighs. Straight leg raise test was negative bilaterally. Pain with lumbar extension and facet loading and lateral rotation which is very similar to the chronic pain that he experiences. Patient states that taking extra strength Tylenol and ibuprofen helps with his pain.  Patient's lumbar MRI shows severe spinal stenosis at L4-L5 secondary to facet arthropathy and hypertrophy and anterolisthesis of L4 on L5. There is also moderate right foraminal narrowing. Patient also has L5-S1 right foraminal impingement due to endplate spur. He has multilevel degenerative disc disease present most pronounced in the L3-L5 region.  Patient's axial low back pain with radiation to posterior thighs is likely secondary to multilevel degenerative lumbar disc disease, lumbar spondylosis without myelopathy, facet arthropathy. We  discussed diagnostic lumbar medial branch blocks at L3-L5 bilaterally with goal radiofrequency ablation. All questions and concerns regarding the procedure were answered.  Plan: -Bilateral L3-L5 lumbar medial branch nerve blocks with goal radiofrequency ablation. Patient not on any blood thinners.  Future: -Consider hip x-rays if lumbar medial branch blocks are not effective. Axial low back pain could also be coming secondary to SI joint pathology and patient could benefit from sacroiliac joint injection versus piriformis injections.

## 2016-09-15 NOTE — Patient Instructions (Addendum)
1. Schedule for bilateral L3-L5 medial branch nerve blocksPain Management Discharge Instructions  General Discharge Instructions :  If you need to reach your doctor call: Monday-Friday 8:00 am - 4:00 pm at [phone] or toll free [phone].  After clinic hours [phone] to have operator reach doctor.  Bring all of your medication bottles to all your appointments in the pain clinic.  To cancel or reschedule your appointment with Pain Management please remember to call 24 hours in advance to avoid a fee.  Refer to the educational materials which you have been given on: General Risks, I had my Procedure. Discharge Instructions, Post Sedation.  Post Procedure Instructions:  The drugs you were given will stay in your system until tomorrow, so for the next 24 hours you should not drive, make any legal decisions or drink any alcoholic beverages.  You may eat anything you prefer, but it is better to start with liquids then soups and crackers, and gradually work up to solid foods.  Please notify your doctor immediately if you have any unusual bleeding, trouble breathing or pain that is not related to your normal pain.  Depending on the type of procedure that was done, some parts of your body may feel week and/or numb.  This usually clears up by tonight or the next day.  Walk with the use of an assistive device or accompanied by an adult for the 24 hours.  You may use ice on the affected area for the first 24 hours.  Put ice in a Ziploc bag and cover with a towel and place against area 15 minutes on 15 minutes off.  You may switch to heat after 24 hours.GENERAL RISKS AND COMPLICATIONS  What are the risk, side effects and possible complications? Generally speaking, most procedures are safe.  However, with any procedure there are risks, side effects, and the possibility of complications.  The risks and complications are dependent upon the sites that are lesioned, or the type of nerve block  to be performed.  The closer the procedure is to the spine, the more serious the risks are.  Great care is taken when placing the radio frequency needles, block needles or lesioning probes, but sometimes complications can occur. 1. Infection: Any time there is an injection through the skin, there is a risk of infection.  This is why sterile conditions are used for these blocks.  There are four possible types of infection. 1. Localized skin infection. 2. Central Nervous System Infection-This can be in the form of Meningitis, which can be deadly. 3. Epidural Infections-This can be in the form of an epidural abscess, which can cause pressure inside of the spine, causing compression of the spinal cord with subsequent paralysis. This would require an emergency surgery to decompress, and there are no guarantees that the patient would recover from the paralysis. 4. Discitis-This is an infection of the intervertebral discs.  It occurs in about 1% of discography procedures.  It is difficult to treat and it may lead to surgery.        2. Pain: the needles have to go through skin and soft tissues, will cause soreness.       3. Damage to internal structures:  The nerves to be lesioned may be near blood vessels or    other nerves which can be potentially damaged.       4. Bleeding: Bleeding is more common if the patient is taking blood thinners such as  aspirin, Coumadin, Ticiid, Plavix, etc., or if  he/she have some genetic predisposition  such as hemophilia. Bleeding into the spinal canal can cause compression of the spinal  cord with subsequent paralysis.  This would require an emergency surgery to  decompress and there are no guarantees that the patient would recover from the  paralysis.       5. Pneumothorax:  Puncturing of a lung is a possibility, every time a needle is introduced in  the area of the chest or upper back.  Pneumothorax refers to free air around the  collapsed lung(s), inside of the thoracic cavity  (chest cavity).  Another two possible  complications related to a similar event would include: Hemothorax and Chylothorax.   These are variations of the Pneumothorax, where instead of air around the collapsed  lung(s), you may have blood or chyle, respectively.       6. Spinal headaches: They may occur with any procedures in the area of the spine.       7. Persistent CSF (Cerebro-Spinal Fluid) leakage: This is a rare problem, but may occur  with prolonged intrathecal or epidural catheters either due to the formation of a fistulous  track or a dural tear.       8. Nerve damage: By working so close to the spinal cord, there is always a possibility of  nerve damage, which could be as serious as a permanent spinal cord injury with  paralysis.       9. Death:  Although rare, severe deadly allergic reactions known as "Anaphylactic  reaction" can occur to any of the medications used.      10. Worsening of the symptoms:  We can always make thing worse.  What are the chances of something like this happening? Chances of any of this occuring are extremely low.  By statistics, you have more of a chance of getting killed in a motor vehicle accident: while driving to the hospital than any of the above occurring .  Nevertheless, you should be aware that they are possibilities.  In general, it is similar to taking a shower.  Everybody knows that you can slip, hit your head and get killed.  Does that mean that you should not shower again?  Nevertheless always keep in mind that statistics do not mean anything if you happen to be on the wrong side of them.  Even if a procedure has a 1 (one) in a 1,000,000 (million) chance of going wrong, it you happen to be that one..Also, keep in mind that by statistics, you have more of a chance of having something go wrong when taking medications.  Who should not have this procedure? If you are on a blood thinning medication (e.g. Coumadin, Plavix, see list of "Blood Thinners"), or if  you have an active infection going on, you should not have the procedure.  If you are taking any blood thinners, please inform your physician.  How should I prepare for this procedure?  Do not eat or drink anything at least six hours prior to the procedure.  Bring a driver with you .  It cannot be a taxi.  Come accompanied by an adult that can drive you back, and that is strong enough to help you if your legs get weak or numb from the local anesthetic.  Take all of your medicines the morning of the procedure with just enough water to swallow them.  If you have diabetes, make sure that you are scheduled to have your procedure done first thing in the  morning, whenever possible.  If you have diabetes, take only half of your insulin dose and notify our nurse that you have done so as soon as you arrive at the clinic.  If you are diabetic, but only take blood sugar pills (oral hypoglycemic), then do not take them on the morning of your procedure.  You may take them after you have had the procedure.  Do not take aspirin or any aspirin-containing medications, at least eleven (11) days prior to the procedure.  They may prolong bleeding.  Wear loose fitting clothing that may be easy to take off and that you would not mind if it got stained with Betadine or blood.  Do not wear any jewelry or perfume  Remove any nail coloring.  It will interfere with some of our monitoring equipment.  NOTE: Remember that this is not meant to be interpreted as a complete list of all possible complications.  Unforeseen problems may occur.  BLOOD THINNERS The following drugs contain aspirin or other products, which can cause increased bleeding during surgery and should not be taken for 2 weeks prior to and 1 week after surgery.  If you should need take something for relief of minor pain, you may take acetaminophen which is found in Tylenol,m Datril, Anacin-3 and Panadol. It is not blood thinner. The products listed  below are.  Do not take any of the products listed below in addition to any listed on your instruction sheet.  A.P.C or A.P.C with Codeine Codeine Phosphate Capsules #3 Ibuprofen Ridaura  ABC compound Congesprin Imuran rimadil  Advil Cope Indocin Robaxisal  Alka-Seltzer Effervescent Pain Reliever and Antacid Coricidin or Coricidin-D  Indomethacin Rufen  Alka-Seltzer plus Cold Medicine Cosprin Ketoprofen S-A-C Tablets  Anacin Analgesic Tablets or Capsules Coumadin Korlgesic Salflex  Anacin Extra Strength Analgesic tablets or capsules CP-2 Tablets Lanoril Salicylate  Anaprox Cuprimine Capsules Levenox Salocol  Anexsia-D Dalteparin Magan Salsalate  Anodynos Darvon compound Magnesium Salicylate Sine-off  Ansaid Dasin Capsules Magsal Sodium Salicylate  Anturane Depen Capsules Marnal Soma  APF Arthritis pain formula Dewitt's Pills Measurin Stanback  Argesic Dia-Gesic Meclofenamic Sulfinpyrazone  Arthritis Bayer Timed Release Aspirin Diclofenac Meclomen Sulindac  Arthritis pain formula Anacin Dicumarol Medipren Supac  Analgesic (Safety coated) Arthralgen Diffunasal Mefanamic Suprofen  Arthritis Strength Bufferin Dihydrocodeine Mepro Compound Suprol  Arthropan liquid Dopirydamole Methcarbomol with Aspirin Synalgos  ASA tablets/Enseals Disalcid Micrainin Tagament  Ascriptin Doan's Midol Talwin  Ascriptin A/D Dolene Mobidin Tanderil  Ascriptin Extra Strength Dolobid Moblgesic Ticlid  Ascriptin with Codeine Doloprin or Doloprin with Codeine Momentum Tolectin  Asperbuf Duoprin Mono-gesic Trendar  Aspergum Duradyne Motrin or Motrin IB Triminicin  Aspirin plain, buffered or enteric coated Durasal Myochrisine Trigesic  Aspirin Suppositories Easprin Nalfon Trillsate  Aspirin with Codeine Ecotrin Regular or Extra Strength Naprosyn Uracel  Atromid-S Efficin Naproxen Ursinus  Auranofin Capsules Elmiron Neocylate Vanquish  Axotal Emagrin Norgesic Verin  Azathioprine Empirin or Empirin with Codeine  Normiflo Vitamin E  Azolid Emprazil Nuprin Voltaren  Bayer Aspirin plain, buffered or children's or timed BC Tablets or powders Encaprin Orgaran Warfarin Sodium  Buff-a-Comp Enoxaparin Orudis Zorpin  Buff-a-Comp with Codeine Equegesic Os-Cal-Gesic   Buffaprin Excedrin plain, buffered or Extra Strength Oxalid   Bufferin Arthritis Strength Feldene Oxphenbutazone   Bufferin plain or Extra Strength Feldene Capsules Oxycodone with Aspirin   Bufferin with Codeine Fenoprofen Fenoprofen Pabalate or Pabalate-SF   Buffets II Flogesic Panagesic   Buffinol plain or Extra Strength Florinal or Florinal with Codeine Panwarfarin   Buf-Tabs Flurbiprofen  Penicillamine   Butalbital Compound Four-way cold tablets Penicillin   Butazolidin Fragmin Pepto-Bismol   Carbenicillin Geminisyn Percodan   Carna Arthritis Reliever Geopen Persantine   Carprofen Gold's salt Persistin   Chloramphenicol Goody's Phenylbutazone   Chloromycetin Haltrain Piroxlcam   Clmetidine heparin Plaquenil   Cllnoril Hyco-pap Ponstel   Clofibrate Hydroxy chloroquine Propoxyphen         Before stopping any of these medications, be sure to consult the physician who ordered them.  Some, such as Coumadin (Warfarin) are ordered to prevent or treat serious conditions such as "deep thrombosis", "pumonary embolisms", and other heart problems.  The amount of time that you may need off of the medication may also vary with the medication and the reason for which you were taking it.  If you are taking any of these medications, please make sure you notify your pain physician before you undergo any procedures.          Facet Joint Block The facet joints connect the bones of the spine (vertebrae). They make it possible for you to bend, twist, and make other movements with your spine. They also keep you from bending too far, twisting too far, and making other excessive movements. A facet joint block is a procedure where a numbing medicine  (anesthetic) is injected into a facet joint. Often, a type of anti-inflammatory medicine called a steroid is also injected. A facet joint block may be done to diagnose neck or back pain. If the pain gets better after a facet joint block, it means the pain is probably coming from the facet joint. If the pain does not get better, it means the pain is probably not coming from the facet joint. A facet joint block may also be done to relieve neck or back pain caused by an inflamed facet joint. A facet joint block is only done to relieve pain if the pain does not improve with other methods, such as medicine, exercise programs, and physical therapy. Tell a health care provider about:  Any allergies you have.  All medicines you are taking, including vitamins, herbs, eye drops, creams, and over-the-counter medicines.  Any problems you or family members have had with anesthetic medicines.  Any blood disorders you have.  Any surgeries you have had.  Any medical conditions you have.  Whether you are pregnant or may be pregnant. What are the risks? Generally, this is a safe procedure. However, problems may occur, including:  Bleeding.  Injury to a nerve near the injection site.  Pain at the injection site.  Weakness or numbness in areas controlled by nerves near the injection site.  Infection.  Temporary fluid retention.  Allergic reactions to medicines or dyes.  Injury to other structures or organs near the injection site.  What happens before the procedure?  Follow instructions from your health care provider about eating or drinking restrictions.  Ask your health care provider about: ? Changing or stopping your regular medicines. This is especially important if you are taking diabetes medicines or blood thinners. ? Taking medicines such as aspirin and ibuprofen. These medicines can thin your blood. Do not take these medicines before your procedure if your health care provider instructs  you not to.  Do not take any new dietary supplements or medicines without asking your health care provider first.  Plan to have someone take you home after the procedure. What happens during the procedure?  You may need to remove your clothing and dress in an open-back gown.  The procedure will be done while you are lying on an X-ray table. You will most likely be asked to lie on your stomach, but you may be asked to lie in a different position if an injection will be made in your neck.  Machines will be used to monitor your oxygen levels, heart rate, and blood pressure.  If an injection will be made in your neck, an IV tube will be inserted into one of your veins. Fluids and medicine will flow directly into your body through the IV tube.  The area over the facet joint where the injection will be made will be cleaned with soap. The surrounding skin will be covered with clean drapes.  A numbing medicine (local anesthetic) will be applied to your skin. Your skin may sting or burn for a moment.  A video X-ray machine (fluoroscopy) will be used to locate the joint. In some cases, a CT scan may be used.  A contrast dye may be injected into the facet joint area to help locate the joint.  When the joint is located, an anesthetic will be injected into the joint through the needle.  Your health care provider will ask you whether you feel pain relief. If you do feel relief, a steroid may be injected to provide pain relief for a longer period of time. If you do not feel relief or feel only partial relief, additional injections of an anesthetic may be made in other facet joints.  The needle will be removed.  Your skin will be cleaned.  A bandage (dressing) will be applied over each injection site. The procedure may vary among health care providers and hospitals. What happens after the procedure?  You will be observed for 15-30 minutes before being allowed to go home. This information is not  intended to replace advice given to you by your health care provider. Make sure you discuss any questions you have with your health care provider. Document Released: 06/02/2006 Document Revised: 02/12/2015 Document Reviewed: 10/07/2014 [employer] Interactive Patient Education  [employer].

## 2016-09-29 DIAGNOSIS — L820 Inflamed seborrheic keratosis: Secondary | ICD-10-CM | POA: Diagnosis not present

## 2016-09-29 DIAGNOSIS — L821 Other seborrheic keratosis: Secondary | ICD-10-CM | POA: Diagnosis not present

## 2016-09-29 DIAGNOSIS — L570 Actinic keratosis: Secondary | ICD-10-CM | POA: Diagnosis not present

## 2016-09-29 DIAGNOSIS — Z85828 Personal history of other malignant neoplasm of skin: Secondary | ICD-10-CM | POA: Diagnosis not present

## 2016-09-29 DIAGNOSIS — D1800 Hemangioma unspecified site: Secondary | ICD-10-CM | POA: Diagnosis not present

## 2016-09-29 DIAGNOSIS — L578 Other skin changes due to chronic exposure to nonionizing radiation: Secondary | ICD-10-CM | POA: Diagnosis not present

## 2016-10-04 ENCOUNTER — Ambulatory Visit (HOSPITAL_BASED_OUTPATIENT_CLINIC_OR_DEPARTMENT_OTHER): Payer: PPO | Admitting: Student in an Organized Health Care Education/Training Program

## 2016-10-04 ENCOUNTER — Ambulatory Visit
Admission: RE | Admit: 2016-10-04 | Discharge: 2016-10-04 | Disposition: A | Discharge status: Home / Self Care | Payer: PPO | Source: Ambulatory Visit | Attending: Student in an Organized Health Care Education/Training Program | Admitting: Student in an Organized Health Care Education/Training Program

## 2016-10-04 ENCOUNTER — Encounter: Payer: Self-pay | Admitting: Student in an Organized Health Care Education/Training Program

## 2016-10-04 DIAGNOSIS — M47896 Other spondylosis, lumbar region: Secondary | ICD-10-CM | POA: Diagnosis not present

## 2016-10-04 DIAGNOSIS — Z881 Allergy status to other antibiotic agents status: Secondary | ICD-10-CM | POA: Insufficient documentation

## 2016-10-04 DIAGNOSIS — M5136 Other intervertebral disc degeneration, lumbar region: Secondary | ICD-10-CM | POA: Diagnosis not present

## 2016-10-04 DIAGNOSIS — E785 Hyperlipidemia, unspecified: Secondary | ICD-10-CM | POA: Diagnosis not present

## 2016-10-04 DIAGNOSIS — Z880 Allergy status to penicillin: Secondary | ICD-10-CM | POA: Insufficient documentation

## 2016-10-04 DIAGNOSIS — I10 Essential (primary) hypertension: Secondary | ICD-10-CM | POA: Insufficient documentation

## 2016-10-04 DIAGNOSIS — M48061 Spinal stenosis, lumbar region without neurogenic claudication: Secondary | ICD-10-CM | POA: Insufficient documentation

## 2016-10-04 DIAGNOSIS — M545 Low back pain: Secondary | ICD-10-CM | POA: Insufficient documentation

## 2016-10-04 DIAGNOSIS — Z888 Allergy status to other drugs, medicaments and biological substances status: Secondary | ICD-10-CM | POA: Insufficient documentation

## 2016-10-04 DIAGNOSIS — M47816 Spondylosis without myelopathy or radiculopathy, lumbar region: Secondary | ICD-10-CM

## 2016-10-04 MED ORDER — FENTANYL CITRATE (PF) 100 MCG/2ML IJ SOLN
25.0000 ug | INTRAMUSCULAR | Status: DC | PRN
  Administered 2016-10-04: 25 ug via INTRAVENOUS
  Filled 2016-10-04: qty 2

## 2016-10-04 MED ORDER — ROPIVACAINE HCL 2 MG/ML IJ SOLN
9.0000 mL | Freq: Once | INTRAMUSCULAR | Status: AC
  Administered 2016-10-04: 10 mL via PERINEURAL
  Filled 2016-10-04: qty 10

## 2016-10-04 MED ORDER — LACTATED RINGERS IV SOLN
1000.0000 mL | Freq: Once | INTRAVENOUS | Status: AC
  Administered 2016-10-04: 1000 mL via INTRAVENOUS

## 2016-10-04 MED ORDER — LIDOCAINE HCL (PF) 1 % IJ SOLN
10.0000 mL | Freq: Once | INTRAMUSCULAR | Status: AC
  Administered 2016-10-04: 5 mL
  Filled 2016-10-04: qty 10

## 2016-10-04 NOTE — Patient Instructions (Signed)

## 2016-10-04 NOTE — Progress Notes (Signed)
Patient's Name: Todd Bennett  MRN: [id number]  Referring Provider: Gillis Santa, MD  DOB: [birth year]  PCP: [name], MD  DOS: 10/04/2016  Note by: Gillis Santa, MD  Service setting: Ambulatory outpatient  Specialty: Interventional Pain Management  Patient type: Established  Location: [facility] (AMB) Pain Management Facility  Visit type: Interventional Procedure   Primary Reason for Visit: Interventional Pain Management Treatment. CC: Back Pain (lower)  Procedure:  Anesthesia, Analgesia, Anxiolysis:  Type: Diagnostic Medial Branch Facet Block#1 Region: Lumbar Level:  L3, L4, L5,  Medial Branch Level(s) Laterality: Bilateral  Type: Local Anesthesia with Moderate (Conscious) Sedation Local Anesthetic: Lidocaine 1% Route: Intravenous (IV) IV Access: Secured Sedation: Meaningful verbal contact was maintained at all times during the procedure  Indication(s): Analgesia and Anxiety  Indications: 1. Lumbar spondylosis    Pain Score: Pre-procedure: 9 /10 Post-procedure: 0-No pain/36  83 year old male with a past medical history of arthritis, hypertension, hyperlipidemia, lumbar degenerative disc disease who presents with axial low back pain with radiation to his posterior thighs, buttocks down to his knees. Patient's pain does not radiate below the knees.  Patient's lumbar MRI shows severe spinal stenosis at L4-L5 secondary to facet arthropathy and hypertrophy and anterolisthesis of L4 on L5. There is also moderate right foraminal narrowing. Patient also has L5-S1 right foraminal impingement due to endplate spur. He has multilevel degenerative disc disease present most pronounced in the L3-L5 region.  Here for bilateral L3-L5 lumbar medial branch nerve blocks with goal radiofrequency ablation   Pre-op Assessment:  Mr. Lipscomb is a 83 y.o. (year old), male patient, seen today for interventional treatment. He  has a past surgical history that includes Ileostomy; Lithotripsy (03/2013); and Hernia  repair (1993). Mr. Sato has a current medication list which includes the following prescription(s): vitamin c, aspirin ec, cholecalciferol, coq-10, dapsone, finasteride, fluoxetine, fluoxetine, fluoxetine, metronidazole, metronidazole, multi-vitamins, NONFORMULARY OR COMPOUNDED ITEM, fish oil, prednisolone acetate, prednisone, shingrix, tamsulosin, venlafaxine xr, finasteride, and finasteride, and the following Facility-Administered Medications: betamethasone acetate-betamethasone sodium phosphate, betamethasone acetate-betamethasone sodium phosphate, fentanyl, and lactated ringers. His primarily concern today is the Back Pain (lower)  Initial Vital Signs: There were no vitals taken for this visit. BMI: Estimated body mass index is 23.01 kg/m as calculated from the following:   Height as of this encounter: 5\' 11"  (1.803 m).   Weight as of this encounter: 165 lb (74.8 kg).  Risk Assessment: Allergies: Reviewed. He is allergic to ciprofloxacin; clarithromycin; oxycodone; penicillins; and sulfur.  Allergy Precautions: None required Coagulopathies: Reviewed. None identified.  Blood-thinner therapy: None at this time Active Infection(s): Reviewed. None identified. Mr. Polgar is afebrile  Site Confirmation: Mr. Colson was asked to confirm the procedure and laterality before marking the site Procedure checklist: Completed Consent: Before the procedure and under the influence of no sedative(s), amnesic(s), or anxiolytics, the patient was informed of the treatment options, risks and possible complications. To fulfill our ethical and legal obligations, as recommended by the American Medical Association's Code of Ethics, I have informed the patient of my clinical impression; the nature and purpose of the treatment or procedure; the risks, benefits, and possible complications of the intervention; the alternatives, including doing nothing; the risk(s) and benefit(s) of the alternative treatment(s) or procedure(s);  and the risk(s) and benefit(s) of doing nothing. The patient was provided information about the general risks and possible complications associated with the procedure. These may include, but are not limited to: failure to achieve desired goals, infection, bleeding, organ or nerve damage, allergic reactions,  paralysis, and death. In addition, the patient was informed of those risks and complications associated to Spine-related procedures, such as failure to decrease pain; infection (i.e.: Meningitis, epidural or intraspinal abscess); bleeding (i.e.: epidural hematoma, subarachnoid hemorrhage, or any other type of intraspinal or peri-dural bleeding); organ or nerve damage (i.e.: Any type of peripheral nerve, nerve root, or spinal cord injury) with subsequent damage to sensory, motor, and/or autonomic systems, resulting in permanent pain, numbness, and/or weakness of one or several areas of the body; allergic reactions; (i.e.: anaphylactic reaction); and/or death. Furthermore, the patient was informed of those risks and complications associated with the medications. These include, but are not limited to: allergic reactions (i.e.: anaphylactic or anaphylactoid reaction(s)); adrenal axis suppression; blood sugar elevation that in diabetics may result in ketoacidosis or comma; water retention that in patients with history of congestive heart failure may result in shortness of breath, pulmonary edema, and decompensation with resultant heart failure; weight gain; swelling or edema; medication-induced neural toxicity; particulate matter embolism and blood vessel occlusion with resultant organ, and/or nervous system infarction; and/or aseptic necrosis of one or more joints. Finally, the patient was informed that Medicine is not an exact science; therefore, there is also the possibility of unforeseen or unpredictable risks and/or possible complications that may result in a catastrophic outcome. The patient indicated having  understood very clearly. We have given the patient no guarantees and we have made no promises. Enough time was given to the patient to ask questions, all of which were answered to the patient's satisfaction. Mr. Preece has indicated that he wanted to continue with the procedure. Attestation: I, the ordering provider, attest that I have discussed with the patient the benefits, risks, side-effects, alternatives, likelihood of achieving goals, and potential problems during recovery for the procedure that I have provided informed consent. Date: 10/04/2016; Time: 10:44 AM  Pre-Procedure Preparation:  Monitoring: As per clinic protocol. Respiration, ETCO2, SpO2, BP, heart rate and rhythm monitor placed and checked for adequate function Safety Precautions: Patient was assessed for positional comfort and pressure points before starting the procedure. Time-out: I initiated and conducted the "Time-out" before starting the procedure, as per protocol. The patient was asked to participate by confirming the accuracy of the "Time Out" information. Verification of the correct person, site, and procedure were performed and confirmed by me, the nursing staff, and the patient. "Time-out" conducted as per Joint Commission's Universal Protocol (UP.01.01.01). "Time-out" Date & Time: 10/04/2016; 1131 hrs.  Description of Procedure Process:   Position: Prone Target Area: For Lumbar Facet blocks, the target is the groove formed by the junction of the transverse process and superior articular process. For the L5 dorsal ramus, the target is the notch between superior articular process and sacral ala. FApproach: Paramedial approach. Area Prepped: Entire Posterior Lumbosacral Region Prepping solution: ChloraPrep (2% chlorhexidine gluconate and 70% isopropyl alcohol) Safety Precautions: Aspiration looking for blood return was conducted prior to all injections. At no point did we inject any substances, as a needle was being advanced.  No attempts were made at seeking any paresthesias. Safe injection practices and needle disposal techniques used. Medications properly checked for expiration dates. SDV (single dose vial) medications used. Description of the Procedure: Protocol guidelines were followed. The patient was placed in position over the fluoroscopy table. The target area was identified and the area prepped in the usual manner. Skin desensitized using vapocoolant spray. Skin & deeper tissues infiltrated with local anesthetic. Appropriate amount of time allowed to pass for local anesthetics to  take effect. The procedure needle was introduced through the skin, ipsilateral to the reported pain, and advanced to the target area. Employing the "Medial Branch Technique", the needles were advanced to the angle made by the superior and medial portion of the transverse process, and the lateral and inferior portion of the superior articulating process of the targeted vertebral bodies. This area is known as "Burton's Eye" or the "Eye of the [place] Dog". A procedure needle was introduced through the skin, and this time advanced to the angle made by the superior and medial border of the sacral ala, and the lateral border of the S1 vertebral body. Negative aspiration confirmed. Solution injected in intermittent fashion, asking for systemic symptoms every 0.5cc of injectate. The needles were then removed and the area cleansed, making sure to leave some of the prepping solution back to take advantage of its long term bactericidal properties.   Illustration of the posterior view of the lumbar spine and the posterior neural structures. Laminae of L2 through S1 are labeled. DPRL5, dorsal primary ramus of L5; DPRS1, dorsal primary ramus of S1; DPR3, dorsal primary ramus of L3; FJ, facet (zygapophyseal) joint L3-L4; I, inferior articular process of L4; LB1, lateral branch of dorsal primary ramus of L1; IAB, inferior articular branches from L3 medial branch  (supplies L4-L5 facet joint); IBP, intermediate branch plexus; MB3, medial branch of dorsal primary ramus of L3; NR3, third lumbar nerve root; S, superior articular process of L5; SAB, superior articular branches from L4 (supplies L4-5 facet joint also); TP3, transverse process of L3.  Vitals:   10/04/16 1147 10/04/16 1157 10/04/16 1207 10/04/16 1216  BP: (!) 155/84 (!) 144/75 (!) 151/74 (!) 154/65  Pulse: 81 66 65 64  Resp: 20 20 15 19   Temp:      TempSrc:      SpO2: 93% 97% 97% 98%  Weight:      Height:        Start Time: 1134 hrs. End Time: 1147 hrs. Materials:  Needle(s) Type: Regular needle Gauge: 22G Length: 3.5-in Medication(s): We administered ropivacaine (PF) 2 mg/mL (0.2%), lidocaine (PF), lactated ringers, and fentaNYL. Please see chart orders for dosing details. 1.5 cc of 0.2% ropivacaine at each level Imaging Guidance (Spinal):  Type of Imaging Technique: Fluoroscopy Guidance (Spinal) Indication(s): Assistance in needle guidance and placement for procedures requiring needle placement in or near specific anatomical locations not easily accessible without such assistance. Exposure Time: Please see nurses notes. Contrast: None used. Fluoroscopic Guidance: I was personally present during the use of fluoroscopy. "Tunnel Vision Technique" used to obtain the best possible view of the target area. Parallax error corrected before commencing the procedure. "Direction-depth-direction" technique used to introduce the needle under continuous pulsed fluoroscopy. Once target was reached, antero-posterior, oblique, and lateral fluoroscopic projection used confirm needle placement in all planes. Images permanently stored in EMR. Interpretation: No contrast injected. I personally interpreted the imaging intraoperatively. Adequate needle placement confirmed in multiple planes. Permanent images saved into the patient's record.  Antibiotic Prophylaxis:  Indication(s): None  identified Antibiotic given: None  Post-operative Assessment:  EBL: None Complications: No immediate post-treatment complications observed by team, or reported by patient. Note: The patient tolerated the entire procedure well. A repeat set of vitals were taken after the procedure and the patient was kept under observation following institutional policy, for this type of procedure. Post-procedural neurological assessment was performed, showing return to baseline, prior to discharge. The patient was provided with post-procedure discharge instructions, including a section on  how to identify potential problems. Should any problems arise concerning this procedure, the patient was given instructions to immediately contact us, at any time, without hesitation. In any case, we plan to contact the patient by telephone for a follow-up status report regarding this interventional procedure. Comments:  No additional relevant information.  Plan of Care  Disposition: Discharge home  Discharge Date & Time: 10/04/2016; 1220 hrs.   Physician-requested Follow-up:  Return for Post Procedure Evaluation.  Future Appointments Date Time Provider [place]  10/13/2016 12:00 PM Gillis Santa, MD [facility] None  04/01/2017 9:00 AM Hollice Espy, MD [facility] None    Imaging Orders     DG C-Arm 1-60 Min-No Report Procedure Orders    No procedure(s) ordered today    Medications ordered for procedure: Meds ordered this encounter  Medications  . ropivacaine (PF) 2 mg/mL (0.2%) (NAROPIN) injection 9 mL  . lidocaine (PF) (XYLOCAINE) 1 % injection 10 mL  . lactated ringers infusion 1,000 mL  . fentaNYL (SUBLIMAZE) injection 25-50 mcg    Make sure Narcan is available in the pyxis when using this medication. In the event of respiratory depression (RR< 8/min): Titrate NARCAN (naloxone) in increments of 0.1 to 0.2 mg IV at 2-3 minute intervals, until desired degree of reversal.   Medications administered: We  administered ropivacaine (PF) 2 mg/mL (0.2%), lidocaine (PF), lactated ringers, and fentaNYL.  See the medical record for exact dosing, route, and time of administration.  New Prescriptions   No medications on file   Primary Care Physician: [name], MD Location: [facility] Note by: Gillis Santa, MD Date: 10/04/2016; Time: 12:23 PM  Disclaimer:  Medicine is not an exact science. The only guarantee in medicine is that nothing is guaranteed. It is important to note that the decision to proceed with this intervention was based on the information collected from the patient. The Data and conclusions were drawn from the patient's questionnaire, the interview, and the physical examination. Because the information was provided in large part by the patient, it cannot be guaranteed that it has not been purposely or unconsciously manipulated. Every effort has been made to obtain as much relevant data as possible for this evaluation. It is important to note that the conclusions that lead to this procedure are derived in large part from the available data. Always take into account that the treatment will also be dependent on availability of resources and existing treatment guidelines, considered by other Pain Management Practitioners as being common knowledge and practice, at the time of the intervention. For Medico-Legal purposes, it is also important to point out that variation in procedural techniques and pharmacological choices are the acceptable norm. The indications, contraindications, technique, and results of the above procedure should only be interpreted and judged by a Board-Certified Interventional Pain Specialist with extensive familiarity and expertise in the same exact procedure and technique.

## 2016-10-04 NOTE — Progress Notes (Signed)
Safety precautions to be maintained throughout the outpatient stay will include: orient to surroundings, keep bed in low position, maintain call bell within reach at all times, provide assistance with transfer out of bed and ambulation.  

## 2016-10-05 ENCOUNTER — Telehealth: Payer: Self-pay | Admitting: *Deleted

## 2016-10-05 NOTE — Telephone Encounter (Signed)
No problems post procedure. 

## 2016-10-11 ENCOUNTER — Telehealth: Payer: Self-pay | Admitting: Student in an Organized Health Care Education/Training Program

## 2016-10-11 ENCOUNTER — Other Ambulatory Visit: Payer: Self-pay | Admitting: Student in an Organized Health Care Education/Training Program

## 2016-10-11 DIAGNOSIS — M47816 Spondylosis without myelopathy or radiculopathy, lumbar region: Secondary | ICD-10-CM

## 2016-10-11 NOTE — Plan of Care (Addendum)
Patient s/p bilateral L3-L5 diagnostic facet blocks on 10/04/16 with >80% improvement in low back and buttock pain for a couple of days. Will plan for diagnostic lumbar facet blocks #2 ASAP with goal RFA thereafter.  Orders Placed This Encounter  Procedures  . LUMBAR FACET(MEDIAL BRANCH NERVE BLOCK) MBNB    Standing Status:   Future    Standing Expiration Date:   11/10/2016    Scheduling Instructions:     Side: bilateral     Level: L3/L4, L4/L5, L5/S1 bilateral facets #2     Sedation: No Sedation.     Timeframe: ASAP    Order Specific Question:   Where will this procedure be performed?    Answer:   [facility]

## 2016-10-11 NOTE — Telephone Encounter (Signed)
Had good relief from initial procedure for couple days but now is hurting extremely in lower back. Has appt on Wed. And does want to proceed with next procedure asap. Wanted to come on Monday and I explained you were out until Wed. He will see you then.

## 2016-10-13 ENCOUNTER — Ambulatory Visit
Payer: PPO | Attending: Student in an Organized Health Care Education/Training Program | Admitting: Student in an Organized Health Care Education/Training Program

## 2016-10-13 ENCOUNTER — Encounter: Payer: Self-pay | Admitting: Student in an Organized Health Care Education/Training Program

## 2016-10-13 VITALS — BP 151/69 | HR 67 | Temp 98.8°F | Resp 18 | Ht 71.0 in | Wt 165.0 lb

## 2016-10-13 DIAGNOSIS — M545 Low back pain: Secondary | ICD-10-CM | POA: Diagnosis not present

## 2016-10-13 DIAGNOSIS — M4690 Unspecified inflammatory spondylopathy, site unspecified: Secondary | ICD-10-CM

## 2016-10-13 DIAGNOSIS — M25551 Pain in right hip: Secondary | ICD-10-CM | POA: Insufficient documentation

## 2016-10-13 DIAGNOSIS — N200 Calculus of kidney: Secondary | ICD-10-CM | POA: Insufficient documentation

## 2016-10-13 DIAGNOSIS — Z87891 Personal history of nicotine dependence: Secondary | ICD-10-CM | POA: Diagnosis not present

## 2016-10-13 DIAGNOSIS — G8929 Other chronic pain: Secondary | ICD-10-CM | POA: Diagnosis not present

## 2016-10-13 DIAGNOSIS — I10 Essential (primary) hypertension: Secondary | ICD-10-CM | POA: Diagnosis not present

## 2016-10-13 DIAGNOSIS — M25562 Pain in left knee: Secondary | ICD-10-CM | POA: Diagnosis not present

## 2016-10-13 DIAGNOSIS — M4316 Spondylolisthesis, lumbar region: Secondary | ICD-10-CM | POA: Diagnosis not present

## 2016-10-13 DIAGNOSIS — Z7982 Long term (current) use of aspirin: Secondary | ICD-10-CM | POA: Insufficient documentation

## 2016-10-13 DIAGNOSIS — E785 Hyperlipidemia, unspecified: Secondary | ICD-10-CM | POA: Diagnosis not present

## 2016-10-13 DIAGNOSIS — N400 Enlarged prostate without lower urinary tract symptoms: Secondary | ICD-10-CM | POA: Insufficient documentation

## 2016-10-13 DIAGNOSIS — M25552 Pain in left hip: Secondary | ICD-10-CM

## 2016-10-13 DIAGNOSIS — M5136 Other intervertebral disc degeneration, lumbar region: Secondary | ICD-10-CM | POA: Diagnosis not present

## 2016-10-13 DIAGNOSIS — F329 Major depressive disorder, single episode, unspecified: Secondary | ICD-10-CM | POA: Diagnosis not present

## 2016-10-13 DIAGNOSIS — M48061 Spinal stenosis, lumbar region without neurogenic claudication: Secondary | ICD-10-CM | POA: Diagnosis not present

## 2016-10-13 DIAGNOSIS — F419 Anxiety disorder, unspecified: Secondary | ICD-10-CM | POA: Diagnosis not present

## 2016-10-13 DIAGNOSIS — M25561 Pain in right knee: Secondary | ICD-10-CM | POA: Insufficient documentation

## 2016-10-13 DIAGNOSIS — M47816 Spondylosis without myelopathy or radiculopathy, lumbar region: Secondary | ICD-10-CM | POA: Diagnosis not present

## 2016-10-13 DIAGNOSIS — M47819 Spondylosis without myelopathy or radiculopathy, site unspecified: Secondary | ICD-10-CM

## 2016-10-13 NOTE — Patient Instructions (Addendum)
Plan for bilateral facet blocks #2  Side: Bilateral Level: L3/L4, L4/L5, and L5/S1 facet blocks of medial branch nerves #2 Sedation: With Sedation. Timeframe: ASAP  Please schedule for Monday 9/24 at 8 am for diagnostic blocks #2.Pain Management Discharge Instructions  General Discharge Instructions :  If you need to reach your doctor call: Monday-Friday 8:00 am - 4:00 pm at [phone] or toll free [phone].  After clinic hours [phone] to have operator reach doctor.  Bring all of your medication bottles to all your appointments in the pain clinic.  To cancel or reschedule your appointment with Pain Management please remember to call 24 hours in advance to avoid a fee.  Refer to the educational materials which you have been given on: General Risks, I had my Procedure. Discharge Instructions, Post Sedation.  Post Procedure Instructions:  The drugs you were given will stay in your system until tomorrow, so for the next 24 hours you should not drive, make any legal decisions or drink any alcoholic beverages.  You may eat anything you prefer, but it is better to start with liquids then soups and crackers, and gradually work up to solid foods.  Please notify your doctor immediately if you have any unusual bleeding, trouble breathing or pain that is not related to your normal pain.  Depending on the type of procedure that was done, some parts of your body may feel week and/or numb.  This usually clears up by tonight or the next day.  Walk with the use of an assistive device or accompanied by an adult for the 24 hours.  You may use ice on the affected area for the first 24 hours.  Put ice in a Ziploc bag and cover with a towel and place against area 15 minutes on 15 minutes off.  You may switch to heat after 24 hours.GENERAL RISKS AND COMPLICATIONS  What are the risk, side effects and possible complications? Generally speaking, most procedures are safe.  However, with any  procedure there are risks, side effects, and the possibility of complications.  The risks and complications are dependent upon the sites that are lesioned, or the type of nerve block to be performed.  The closer the procedure is to the spine, the more serious the risks are.  Great care is taken when placing the radio frequency needles, block needles or lesioning probes, but sometimes complications can occur. 1. Infection: Any time there is an injection through the skin, there is a risk of infection.  This is why sterile conditions are used for these blocks.  There are four possible types of infection. 1. Localized skin infection. 2. Central Nervous System Infection-This can be in the form of Meningitis, which can be deadly. 3. Epidural Infections-This can be in the form of an epidural abscess, which can cause pressure inside of the spine, causing compression of the spinal cord with subsequent paralysis. This would require an emergency surgery to decompress, and there are no guarantees that the patient would recover from the paralysis. 4. Discitis-This is an infection of the intervertebral discs.  It occurs in about 1% of discography procedures.  It is difficult to treat and it may lead to surgery.        2. Pain: the needles have to go through skin and soft tissues, will cause soreness.       3. Damage to internal structures:  The nerves to be lesioned may be near blood vessels or    other nerves which can be potentially  damaged.       4. Bleeding: Bleeding is more common if the patient is taking blood thinners such as  aspirin, Coumadin, Ticiid, Plavix, etc., or if he/she have some genetic predisposition  such as hemophilia. Bleeding into the spinal canal can cause compression of the spinal  cord with subsequent paralysis.  This would require an emergency surgery to  decompress and there are no guarantees that the patient would recover from the  paralysis.       5. Pneumothorax:  Puncturing of a lung is  a possibility, every time a needle is introduced in  the area of the chest or upper back.  Pneumothorax refers to free air around the  collapsed lung(s), inside of the thoracic cavity (chest cavity).  Another two possible  complications related to a similar event would include: Hemothorax and Chylothorax.   These are variations of the Pneumothorax, where instead of air around the collapsed  lung(s), you may have blood or chyle, respectively.       6. Spinal headaches: They may occur with any procedures in the area of the spine.       7. Persistent CSF (Cerebro-Spinal Fluid) leakage: This is a rare problem, but may occur  with prolonged intrathecal or epidural catheters either due to the formation of a fistulous  track or a dural tear.       8. Nerve damage: By working so close to the spinal cord, there is always a possibility of  nerve damage, which could be as serious as a permanent spinal cord injury with  paralysis.       9. Death:  Although rare, severe deadly allergic reactions known as "Anaphylactic  reaction" can occur to any of the medications used.      10. Worsening of the symptoms:  We can always make thing worse.  What are the chances of something like this happening? Chances of any of this occuring are extremely low.  By statistics, you have more of a chance of getting killed in a motor vehicle accident: while driving to the hospital than any of the above occurring .  Nevertheless, you should be aware that they are possibilities.  In general, it is similar to taking a shower.  Everybody knows that you can slip, hit your head and get killed.  Does that mean that you should not shower again?  Nevertheless always keep in mind that statistics do not mean anything if you happen to be on the wrong side of them.  Even if a procedure has a 1 (one) in a 1,000,000 (million) chance of going wrong, it you happen to be that one..Also, keep in mind that by statistics, you have more of a chance of having  something go wrong when taking medications.  Who should not have this procedure? If you are on a blood thinning medication (e.g. Coumadin, Plavix, see list of "Blood Thinners"), or if you have an active infection going on, you should not have the procedure.  If you are taking any blood thinners, please inform your physician.  How should I prepare for this procedure?  Do not eat or drink anything at least six hours prior to the procedure.  Bring a driver with you .  It cannot be a taxi.  Come accompanied by an adult that can drive you back, and that is strong enough to help you if your legs get weak or numb from the local anesthetic.  Take all of your medicines the morning  of the procedure with just enough water to swallow them.  If you have diabetes, make sure that you are scheduled to have your procedure done first thing in the morning, whenever possible.  If you have diabetes, take only half of your insulin dose and notify our nurse that you have done so as soon as you arrive at the clinic.  If you are diabetic, but only take blood sugar pills (oral hypoglycemic), then do not take them on the morning of your procedure.  You may take them after you have had the procedure.  Do not take aspirin or any aspirin-containing medications, at least eleven (11) days prior to the procedure.  They may prolong bleeding.  Wear loose fitting clothing that may be easy to take off and that you would not mind if it got stained with Betadine or blood.  Do not wear any jewelry or perfume  Remove any nail coloring.  It will interfere with some of our monitoring equipment.  NOTE: Remember that this is not meant to be interpreted as a complete list of all possible complications.  Unforeseen problems may occur.  BLOOD THINNERS The following drugs contain aspirin or other products, which can cause increased bleeding during surgery and should not be taken for 2 weeks prior to and 1 week after surgery.  If you  should need take something for relief of minor pain, you may take acetaminophen which is found in Tylenol,m Datril, Anacin-3 and Panadol. It is not blood thinner. The products listed below are.  Do not take any of the products listed below in addition to any listed on your instruction sheet.  A.P.C or A.P.C with Codeine Codeine Phosphate Capsules #3 Ibuprofen Ridaura  ABC compound Congesprin Imuran rimadil  Advil Cope Indocin Robaxisal  Alka-Seltzer Effervescent Pain Reliever and Antacid Coricidin or Coricidin-D  Indomethacin Rufen  Alka-Seltzer plus Cold Medicine Cosprin Ketoprofen S-A-C Tablets  Anacin Analgesic Tablets or Capsules Coumadin Korlgesic Salflex  Anacin Extra Strength Analgesic tablets or capsules CP-2 Tablets Lanoril Salicylate  Anaprox Cuprimine Capsules Levenox Salocol  Anexsia-D Dalteparin Magan Salsalate  Anodynos Darvon compound Magnesium Salicylate Sine-off  Ansaid Dasin Capsules Magsal Sodium Salicylate  Anturane Depen Capsules Marnal Soma  APF Arthritis pain formula Dewitt's Pills Measurin Stanback  Argesic Dia-Gesic Meclofenamic Sulfinpyrazone  Arthritis Bayer Timed Release Aspirin Diclofenac Meclomen Sulindac  Arthritis pain formula Anacin Dicumarol Medipren Supac  Analgesic (Safety coated) Arthralgen Diffunasal Mefanamic Suprofen  Arthritis Strength Bufferin Dihydrocodeine Mepro Compound Suprol  Arthropan liquid Dopirydamole Methcarbomol with Aspirin Synalgos  ASA tablets/Enseals Disalcid Micrainin Tagament  Ascriptin Doan's Midol Talwin  Ascriptin A/D Dolene Mobidin Tanderil  Ascriptin Extra Strength Dolobid Moblgesic Ticlid  Ascriptin with Codeine Doloprin or Doloprin with Codeine Momentum Tolectin  Asperbuf Duoprin Mono-gesic Trendar  Aspergum Duradyne Motrin or Motrin IB Triminicin  Aspirin plain, buffered or enteric coated Durasal Myochrisine Trigesic  Aspirin Suppositories Easprin Nalfon Trillsate  Aspirin with Codeine Ecotrin Regular or Extra Strength  Naprosyn Uracel  Atromid-S Efficin Naproxen Ursinus  Auranofin Capsules Elmiron Neocylate Vanquish  Axotal Emagrin Norgesic Verin  Azathioprine Empirin or Empirin with Codeine Normiflo Vitamin E  Azolid Emprazil Nuprin Voltaren  Bayer Aspirin plain, buffered or children's or timed BC Tablets or powders Encaprin Orgaran Warfarin Sodium  Buff-a-Comp Enoxaparin Orudis Zorpin  Buff-a-Comp with Codeine Equegesic Os-Cal-Gesic   Buffaprin Excedrin plain, buffered or Extra Strength Oxalid   Bufferin Arthritis Strength Feldene Oxphenbutazone   Bufferin plain or Extra Strength Feldene Capsules Oxycodone with Aspirin   Bufferin  with Codeine Fenoprofen Fenoprofen Pabalate or Pabalate-SF   Buffets II Flogesic Panagesic   Buffinol plain or Extra Strength Florinal or Florinal with Codeine Panwarfarin   Buf-Tabs Flurbiprofen Penicillamine   Butalbital Compound Four-way cold tablets Penicillin   Butazolidin Fragmin Pepto-Bismol   Carbenicillin Geminisyn Percodan   Carna Arthritis Reliever Geopen Persantine   Carprofen Gold's salt Persistin   Chloramphenicol Goody's Phenylbutazone   Chloromycetin Haltrain Piroxlcam   Clmetidine heparin Plaquenil   Cllnoril Hyco-pap Ponstel   Clofibrate Hydroxy chloroquine Propoxyphen         Before stopping any of these medications, be sure to consult the physician who ordered them.  Some, such as Coumadin (Warfarin) are ordered to prevent or treat serious conditions such as "deep thrombosis", "pumonary embolisms", and other heart problems.  The amount of time that you may need off of the medication may also vary with the medication and the reason for which you were taking it.  If you are taking any of these medications, please make sure you notify your pain physician before you undergo any procedures.          Facet Joint Block The facet joints connect the bones of the spine (vertebrae). They make it possible for you to bend, twist, and make other movements  with your spine. They also keep you from bending too far, twisting too far, and making other excessive movements. A facet joint block is a procedure where a numbing medicine (anesthetic) is injected into a facet joint. Often, a type of anti-inflammatory medicine called a steroid is also injected. A facet joint block may be done to diagnose neck or back pain. If the pain gets better after a facet joint block, it means the pain is probably coming from the facet joint. If the pain does not get better, it means the pain is probably not coming from the facet joint. A facet joint block may also be done to relieve neck or back pain caused by an inflamed facet joint. A facet joint block is only done to relieve pain if the pain does not improve with other methods, such as medicine, exercise programs, and physical therapy. Tell a health care provider about:  Any allergies you have.  All medicines you are taking, including vitamins, herbs, eye drops, creams, and over-the-counter medicines.  Any problems you or family members have had with anesthetic medicines.  Any blood disorders you have.  Any surgeries you have had.  Any medical conditions you have.  Whether you are pregnant or may be pregnant. What are the risks? Generally, this is a safe procedure. However, problems may occur, including:  Bleeding.  Injury to a nerve near the injection site.  Pain at the injection site.  Weakness or numbness in areas controlled by nerves near the injection site.  Infection.  Temporary fluid retention.  Allergic reactions to medicines or dyes.  Injury to other structures or organs near the injection site.  What happens before the procedure?  Follow instructions from your health care provider about eating or drinking restrictions.  Ask your health care provider about: ? Changing or stopping your regular medicines. This is especially important if you are taking diabetes medicines or blood  thinners. ? Taking medicines such as aspirin and ibuprofen. These medicines can thin your blood. Do not take these medicines before your procedure if your health care provider instructs you not to.  Do not take any new dietary supplements or medicines without asking your health care provider first.  Plan to have someone take you home after the procedure. What happens during the procedure?  You may need to remove your clothing and dress in an open-back gown.  The procedure will be done while you are lying on an X-ray table. You will most likely be asked to lie on your stomach, but you may be asked to lie in a different position if an injection will be made in your neck.  Machines will be used to monitor your oxygen levels, heart rate, and blood pressure.  If an injection will be made in your neck, an IV tube will be inserted into one of your veins. Fluids and medicine will flow directly into your body through the IV tube.  The area over the facet joint where the injection will be made will be cleaned with soap. The surrounding skin will be covered with clean drapes.  A numbing medicine (local anesthetic) will be applied to your skin. Your skin may sting or burn for a moment.  A video X-ray machine (fluoroscopy) will be used to locate the joint. In some cases, a CT scan may be used.  A contrast dye may be injected into the facet joint area to help locate the joint.  When the joint is located, an anesthetic will be injected into the joint through the needle.  Your health care provider will ask you whether you feel pain relief. If you do feel relief, a steroid may be injected to provide pain relief for a longer period of time. If you do not feel relief or feel only partial relief, additional injections of an anesthetic may be made in other facet joints.  The needle will be removed.  Your skin will be cleaned.  A bandage (dressing) will be applied over each injection site. The procedure  may vary among health care providers and hospitals. What happens after the procedure?  You will be observed for 15-30 minutes before being allowed to go home. This information is not intended to replace advice given to you by your health care provider. Make sure you discuss any questions you have with your health care provider. Document Released: 06/02/2006 Document Revised: 02/12/2015 Document Reviewed: 10/07/2014 [employer] Interactive Patient Education  [employer].

## 2016-10-13 NOTE — Progress Notes (Signed)
Safety precautions to be maintained throughout the outpatient stay will include: orient to surroundings, keep bed in low position, maintain call bell within reach at all times, provide assistance with transfer out of bed and ambulation.  

## 2016-10-13 NOTE — Progress Notes (Signed)
Patient's Name: Todd Bennett  MRN: [id number]  Referring Provider: [name], MD  DOB: [birth year]  PCP: [name], MD  DOS: 10/13/2016  Note by: Gillis Santa, MD  Service setting: Ambulatory outpatient  Specialty: Interventional Pain Management  Location: [facility] (AMB) Pain Management Facility    Patient type: Established   Primary Reason(s) for Visit: Encounter for post-procedure evaluation of chronic illness with mild to moderate exacerbation CC: Back Pain (low and left) and Leg Pain (down to knee posteriorly)  HPI  Todd Bennett is a 83 y.o. year old, male patient, who comes today for a post-procedure evaluation. He has Anxiety; Benign prostatic hyperplasia with urinary obstruction; Benign prostatic hypertrophy without urinary obstruction; Bladder calculi; Calculus of kidney; Calculi, ureter; Clinical depression; Pilar Plate hematuria; and HLD (hyperlipidemia) on his problem list. His primarily concern today is the Back Pain (low and left) and Leg Pain (down to knee posteriorly)  Pain Assessment: Location: Lower, Left Back Radiating: left leg Onset: More than a month ago Duration: Chronic pain Quality: Aching, Sharp, Shooting, Radiating Severity: 7 /10 (self-reported pain score)  Note: Reported level is compatible with observation.                   Effect on ADL:   Timing: Constant Modifying factors: procedures, medicationss, heat  Todd Bennett comes in today for post-procedure evaluation after the treatment done on 10/11/2016.  Further details on both, my assessment(s), as well as the proposed treatment plan, please see below.  Post-Procedure Assessment  10/11/2016 Procedure: Bilateral lumbar facet blocks at L3, L4, L5 #1 on 10/04/2016 Pre-procedure pain score:  9/10 Post-procedure pain score: 0/10         Influential Factors: BMI: 23.01 kg/m Intra-procedural challenges: None observed.         Assessment challenges: None detected.              Reported side-effects: None.          Post-procedural adverse reactions or complications: None reported         Sedation: Please see nurses note. When no sedatives are used, the analgesic levels obtained are directly associated to the effectiveness of the local anesthetics. However, when sedation is provided, the level of analgesia obtained during the initial 1 hour following the intervention, is believed to be the result of a combination of factors. These factors may include, but are not limited to: 1. The effectiveness of the local anesthetics used. 2. The effects of the analgesic(s) and/or anxiolytic(s) used. 3. The degree of discomfort experienced by the patient at the time of the procedure. 4. The patients ability and reliability in recalling and recording the events. 5. The presence and influence of possible secondary gains and/or psychosocial factors. Reported result: Relief experienced during the 1st hour after the procedure: 0 % (Ultra-Short Term Relief)            Interpretative annotation: Clinically appropriate result. Analgesia during this period is likely to be Local Anesthetic and/or IV Sedative (Analgesic/Anxiolytic) related.          Effects of local anesthetic: The analgesic effects attained during this period are directly associated to the localized infiltration of local anesthetics and therefore cary significant diagnostic value as to the etiological location, or anatomical origin, of the pain. Expected duration of relief is directly dependent on the pharmacodynamics of the local anesthetic used. Long-acting (4-6 hours) anesthetics used.  Reported result: Relief during the next 4 to 6 hour after the procedure: 80 % (Short-Term  Relief)            Interpretative annotation: Clinically appropriate result. Analgesia during this period is likely to be Local Anesthetic-related.          Patient endorses 100% pain relief of low back pain that radiates to bilateral thighs for approximately 1-2 days after the  block.  Long-term benefit: Defined as the period of time past the expected duration of local anesthetics (1 hour for short-acting and 4-6 hours for long-acting). With the possible exception of prolonged sympathetic blockade from the local anesthetics, benefits during this period are typically attributed to, or associated with, other factors such as analgesic sensory neuropraxia, antiinflammatory effects, or beneficial biochemical changes provided by agents other than the local anesthetics.  Reported result: Extended relief following procedure: 40 % (Long-Term Relief)            Interpretative annotation: Clinically appropriate result. Good relief. Therapeutic success. Inflammation plays a part in the etiology to the pain.          Current benefits: Defined as persistent relief that continues at this point in time.   Reported results: Treated area: <75 %       Interpretative annotation: Recurrence of symptoms. Limited therapeutic benefit. Results would suggest further treatment needed.          Interpretation: Results would suggest a successful diagnostic intervention. We'll proceed with diagnostic intervention #2, as soon as convenient          Plan:  Please see "Plan of Care" for details.  Laboratory Chemistry  Inflammation Markers (CRP: Acute Phase) (ESR: Chronic Phase) No results found for: CRP, ESRSEDRATE               Renal Function Markers Lab Results  Component Value Date   BUN 21 (H) 02/20/2013   CREATININE 1.05 02/20/2013   GFRAA >60 02/20/2013   GFRNONAA >60 02/20/2013                 Hepatic Function Markers Lab Results  Component Value Date   AST 38 (H) 02/20/2013   ALT 62 02/20/2013   ALBUMIN 3.8 02/20/2013   ALKPHOS 121 (H) 02/20/2013                 Electrolytes Lab Results  Component Value Date   NA 135 (L) 02/20/2013   K 3.9 02/20/2013   CL 105 02/20/2013   CALCIUM 9.2 02/20/2013                 Neuropathy Markers No results found for: [id number]                Bone Pathology Markers Lab Results  Component Value Date   ALKPHOS 121 (H) 02/20/2013   CALCIUM 9.2 02/20/2013                 Coagulation Parameters Lab Results  Component Value Date   PLT 255 02/20/2013                 Cardiovascular Markers Lab Results  Component Value Date   HGB 16.9 02/20/2013   HCT 50.4 02/20/2013                 Note: Lab results reviewed.  Recent Diagnostic Imaging Review  Dg C-arm 1-60 Min-no Report  Result Date: 10/04/2016 Fluoroscopy was utilized by the requesting physician.  No radiographic interpretation.   Note: Imaging results reviewed.          Meds  Current Outpatient Prescriptions:  .  Ascorbic Acid (VITAMIN C) 1000 MG tablet, Take 1,000 mg by mouth., Disp: , Rfl:  .  aspirin EC 81 MG tablet, daily. , Disp: , Rfl:  .  Cholecalciferol (VITAMIN D-1000 MAX ST) 1000 UNITS tablet, Take 1,000 Units by mouth daily. , Disp: , Rfl:  .  Coenzyme Q10 (COQ-10) 200 MG CAPS, Take 200 mg by mouth., Disp: , Rfl:  .  Dapsone (ACZONE) 5 % topical gel, , Disp: , Rfl:  .  finasteride (PROSCAR) 5 MG tablet, TAKE 1 TABLET EVERY DAY, Disp: 90 tablet, Rfl: 2 .  finasteride (PROSCAR) 5 MG tablet, TAKE 1 TABLET EVERY DAY, Disp: 90 tablet, Rfl: 2 .  finasteride (PROSCAR) 5 MG tablet, TAKE 1 TABLET EVERY DAY, Disp: 90 tablet, Rfl: 2 .  FLUoxetine (PROZAC) 10 MG capsule, , Disp: , Rfl:  .  FLUoxetine (PROZAC) 10 MG capsule, Take by mouth., Disp: , Rfl:  .  FLUoxetine (PROZAC) 20 MG capsule, Take 30 mg by mouth daily. , Disp: , Rfl:  .  metroNIDAZOLE (METROGEL) 0.75 % gel, Apply 1 application topically daily. , Disp: , Rfl:  .  metroNIDAZOLE (METROGEL) 1 % gel, Apply topically., Disp: , Rfl:  .  Multiple Vitamin (MULTI-VITAMINS) TABS, Take by mouth daily. , Disp: , Rfl:  .  NONFORMULARY OR COMPOUNDED ITEM, Apply 1-2 g topically 4 (four) times daily. (Patient taking differently: Apply 1-2 g topically as needed. ), Disp: 120 each, Rfl: 2 .  Omega-3 Fatty  Acids (FISH OIL) 1000 MG CAPS, Take by mouth daily. , Disp: , Rfl:  .  prednisoLONE acetate (PRED FORTE) 1 % ophthalmic suspension, Place 1 drop into the right eye daily., Disp: , Rfl:  .  predniSONE (DELTASONE) 10 MG tablet, , Disp: , Rfl:  .  SHINGRIX injection, , Disp: , Rfl:  .  tamsulosin (FLOMAX) 0.4 MG CAPS capsule, Take 0.4 mg by mouth daily. , Disp: , Rfl:  .  venlafaxine XR (EFFEXOR-XR) 150 MG 24 hr capsule, Take 150 mg by mouth daily with breakfast. , Disp: , Rfl:   Current Facility-Administered Medications:  .  betamethasone acetate-betamethasone sodium phosphate (CELESTONE) injection 12 mg, 12 mg, Intramuscular, Once, Evans, Brent M, DPM .  betamethasone acetate-betamethasone sodium phosphate (CELESTONE) injection 3 mg, 3 mg, Intramuscular, Once, Evans, Brent M, DPM  ROS  Constitutional: Denies any fever or chills Gastrointestinal: No reported hemesis, hematochezia, vomiting, or acute GI distress Musculoskeletal: Denies any acute onset joint swelling, redness, loss of ROM, or weakness Neurological: No reported episodes of acute onset apraxia, aphasia, dysarthria, agnosia, amnesia, paralysis, loss of coordination, or loss of consciousness  Allergies  Todd Bennett is allergic to ciprofloxacin; clarithromycin; oxycodone; penicillins; and sulfur.  [place]  Drug: Mr. Rushing  reports that he does not use drugs. Alcohol:  reports that he does not drink alcohol. Tobacco:  reports that he has quit smoking. He has never used smokeless tobacco. Medical:  has a past medical history of Allergy; Anxiety (06/17/2014); Benign prostatic hyperplasia with urinary obstruction (06/17/2014); Benign prostatic hypertrophy without urinary obstruction (06/20/2013); Bladder calculi (04/26/2013); Calculi, ureter (03/29/2013); Calculus of kidney (02/25/2013); Cataract; Clinical depression (06/17/2014); Degenerative disc disease, lumbar; Frank hematuria (06/28/2013); HLD (hyperlipidemia) (06/17/2014); and Spondylolysis. Surgical:  Mr. Stay  has a past surgical history that includes Ileostomy; Lithotripsy (03/2013); and Hernia repair (1993). Family: family history includes Diabetes in his mother.  Constitutional Exam  General appearance: Well nourished, well developed, and well hydrated. In no apparent acute distress  Vitals:   10/13/16 1153 10/13/16 1154  BP:  (!) 151/69  Pulse:  67  Resp:  18  Temp:  98.8 F (37.1 C)  TempSrc:  Oral  SpO2:  95%  Weight: 165 lb (74.8 kg) 165 lb (74.8 kg)  Height: _0  (1.803 m) _1  (1.803 m)   BMI Assessment: Estimated body mass index is 23.01 kg/m as calculated from the following:   Height as of this encounter: _2  (1.803 m).   Weight as of this encounter: 165 lb (74.8 kg).  BMI interpretation table: BMI level Category Range association with higher incidence of chronic pain  <18 kg/m2 Underweight   18.5-24.9 kg/m2 Ideal body weight   25-29.9 kg/m2 Overweight Increased incidence by 20%  30-34.9 kg/m2 Obese (Class I) Increased incidence by 68%  35-39.9 kg/m2 Severe obesity (Class II) Increased incidence by 136%  >40 kg/m2 Extreme obesity (Class III) Increased incidence by 254%   BMI Readings from Last 4 Encounters:  10/13/16 23.01 kg/m  10/04/16 23.01 kg/m  09/15/16 22.38 kg/m  04/02/16 22.78 kg/m   Wt Readings from Last 4 Encounters:  10/13/16 165 lb (74.8 kg)  10/04/16 165 lb (74.8 kg)  09/15/16 165 lb (74.8 kg)  04/02/16 168 lb (76.2 kg)  Psych/Mental status: Alert, oriented x 3 (person, place, & time)       Eyes: PERLA Respiratory: No evidence of acute respiratory distress  Cervical Spine Area Exam  Skin & Axial Inspection: No masses, redness, edema, swelling, or associated skin lesions Alignment: Symmetrical Functional ROM: Unrestricted ROM      Stability: No instability detected Muscle Tone/Strength: Functionally intact. No obvious neuro-muscular anomalies detected. Sensory (Neurological): Unimpaired Palpation: No palpable anomalies               Upper Extremity (UE) Exam    Side: Right upper extremity  Side: Left upper extremity  Skin & Extremity Inspection: Skin color, temperature, and hair growth are WNL. No peripheral edema or cyanosis. No masses, redness, swelling, asymmetry, or associated skin lesions. No contractures.  Skin & Extremity Inspection: Skin color, temperature, and hair growth are WNL. No peripheral edema or cyanosis. No masses, redness, swelling, asymmetry, or associated skin lesions. No contractures.  Functional ROM: Unrestricted ROM          Functional ROM: Unrestricted ROM          Muscle Tone/Strength: Functionally intact. No obvious neuro-muscular anomalies detected.  Muscle Tone/Strength: Functionally intact. No obvious neuro-muscular anomalies detected.  Sensory (Neurological): Unimpaired          Sensory (Neurological): Unimpaired          Palpation: No palpable anomalies              Palpation: No palpable anomalies              Specialized Test(s): Deferred         Specialized Test(s): Deferred          Thoracic Spine Area Exam  Skin & Axial Inspection: No masses, redness, or swelling Alignment: Symmetrical Functional ROM: Unrestricted ROM Stability: No instability detected Muscle Tone/Strength: Functionally intact. No obvious neuro-muscular anomalies detected. Sensory (Neurological): Unimpaired Muscle strength & Tone: No palpable anomalies  Lumbar Spine Area Exam  Skin & Axial Inspection: No masses, redness, or swelling Alignment: Symmetrical Functional ROM: Unrestricted ROM      Stability: No instability detected Muscle Tone/Strength: Functionally intact. No obvious neuro-muscular anomalies detected. Sensory (Neurological): Unimpaired Palpation: No palpable anomalies  Provocative Tests: Lumbar Hyperextension and rotation test: Positive bilaterally for facet joint pain. However improved from first exam prior to facet blocks Lumbar Lateral bending test: Negative       Patrick's Maneuver:  evaluation deferred today                   Positive pain with lumbar extension but improved range of motion since having facet blocks performed.  Gait & Posture Assessment  Ambulation: Limited Gait: Relatively normal for age and body habitus Posture: WNL   Lower Extremity Exam    Side: Right lower extremity  Side: Left lower extremity  Skin & Extremity Inspection: Skin color, temperature, and hair growth are WNL. No peripheral edema or cyanosis. No masses, redness, swelling, asymmetry, or associated skin lesions. No contractures.  Skin & Extremity Inspection: Skin color, temperature, and hair growth are WNL. No peripheral edema or cyanosis. No masses, redness, swelling, asymmetry, or associated skin lesions. No contractures.  Functional ROM: Unrestricted ROM          Functional ROM: Unrestricted ROM          Muscle Tone/Strength: Functionally intact. No obvious neuro-muscular anomalies detected.  Muscle Tone/Strength: Functionally intact. No obvious neuro-muscular anomalies detected.  Sensory (Neurological): Unimpaired  Sensory (Neurological): Unimpaired  Palpation: Tender in the area of the posterior thighs bilaterally   Palpation: Tender    Assessment  Primary Diagnosis & Pertinent Problem List: The primary encounter diagnosis was Spondylosis without myelopathy or radiculopathy, lumbar region. Diagnoses of Lumbar degenerative disc disease, Facet arthropathy ([facility]), and Chronic arthralgias of knees and hips were also pertinent to this visit.  Status Diagnosis  Responding Stable Responding 1. Spondylosis without myelopathy or radiculopathy, lumbar region   2. Lumbar degenerative disc disease   3. Facet arthropathy ([place])   4. Chronic arthralgias of knees and hips       83 year old male with a past medical history of arthritis, hypertension, hyperlipidemia, lumbar degenerative disc disease who presents with axial low back pain with radiation to his posterior thighs, buttocks  down to his knees. Patient's pain does not radiate below the knees. Patient denies any numbness tingling or burning. He describes it as more of an achy throb in his back that extends to his posterior thighs. Straight leg raise test was negative bilaterally. Pain with lumbar extension and facet loading and lateral rotation which is very similar to the chronic pain that he experiences. Patient states that taking extra strength Tylenol and ibuprofen helps with his pain.  Patient's lumbar MRI shows severe spinal stenosis at L4-L5 secondary to facet arthropathy and hypertrophy and anterolisthesis of L4 on L5. There is also moderate right foraminal narrowing. Patient also has L5-S1 right foraminal impingement due to endplate spur. He has multilevel degenerative disc disease present most pronounced in the L3-L5 region.  Patient is status post bilateral L3-L4, L4-L5, L5-S1 facet diagnostic blocks on 10/04/2016. Patient states that the blocks provided him with approximately 80% pain relief on postprocedure day 0, approximately 100% pain relief on postprocedure stay 1 and 2. He states that after the pain has returned but not to the same extent as before. Patient continues to have pain with lumbar extension however it is improved from his first exam.  Patient states significant short-term relief with the diagnostic blocks indicating a positive diagnostic intervention. We'll schedule her for diagnostic blocks #2 at the same levels with goal radiofrequency ablation thereafter.  Plan of Care   83 year old male with a past medical history of  arthritis, hypertension, hyperlipidemia, lumbar degenerative disc disease who presents with axial low back pain with radiation to his posterior thighs, buttocks down to his knees. Patient's pain does not radiate below the knees. Patient denies any numbness tingling or burning. He describes it as more of an achy throb in his back that extends to his posterior thighs. Straight leg  raise test was negative bilaterally. Pain with lumbar extension and facet loading and lateral rotation which is very similar to the chronic pain that he experiences. Patient states that taking extra strength Tylenol and ibuprofen helps with his pain.  Patient's lumbar MRI shows severe spinal stenosis at L4-L5 secondary to facet arthropathy and hypertrophy and anterolisthesis of L4 on L5. There is also moderate right foraminal narrowing. Patient also has L5-S1 right foraminal impingement due to endplate spur. He has multilevel degenerative disc disease present most pronounced in the L3-L5 region.  Patient states greater than 80% pain relief for approximately 2-3 days after diagnostic facet block on 10/04/2016. Patient wants to proceed with second block and then radiofrequency ablation thereafter.  Plan: -Lumbar facet block #2, bilateral -Radiofrequency ablation thereafter   Lab-work, procedure(s), and/or referral(s): Orders Placed This Encounter  Procedures  . LUMBAR FACET(MEDIAL BRANCH NERVE BLOCK) [facility]    Provider-requested follow-up: Return for Procedure.  Future Appointments Date Time Provider [place]  10/18/2016 8:00 AM Gillis Santa, MD [facility] None  04/01/2017 9:00 AM Hollice Espy, MD [facility] None    Primary Care Physician: [name], MD Location: [facility] Outpatient [facility] Management Facility Note by: Gillis Santa, M.D Date: 10/13/2016; Time: 3:29 PM  Patient Instructions   Plan for bilateral facet blocks #2  Side: Bilateral Level: L3/L4, L4/L5, and L5/S1 facet blocks of medial branch nerves #2 Sedation: With Sedation. Timeframe: ASAP  Please schedule for Monday 9/24 at 8 am for diagnostic blocks #2.Pain Management Discharge Instructions  General Discharge Instructions :  If you need to reach your doctor call: Monday-Friday 8:00 am - 4:00 pm at [phone] or toll free [phone].  After clinic hours [phone] to have operator reach  doctor.  Bring all of your medication bottles to all your appointments in the pain clinic.  To cancel or reschedule your appointment with Pain Management please remember to call 24 hours in advance to avoid a fee.  Refer to the educational materials which you have been given on: General Risks, I had my Procedure. Discharge Instructions, Post Sedation.  Post Procedure Instructions:  The drugs you were given will stay in your system until tomorrow, so for the next 24 hours you should not drive, make any legal decisions or drink any alcoholic beverages.  You may eat anything you prefer, but it is better to start with liquids then soups and crackers, and gradually work up to solid foods.  Please notify your doctor immediately if you have any unusual bleeding, trouble breathing or pain that is not related to your normal pain.  Depending on the type of procedure that was done, some parts of your body may feel week and/or numb.  This usually clears up by tonight or the next day.  Walk with the use of an assistive device or accompanied by an adult for the 24 hours.  You may use ice on the affected area for the first 24 hours.  Put ice in a Ziploc bag and cover with a towel and place against area 15 minutes on 15 minutes off.  You may switch to heat after 24 hours.GENERAL RISKS AND COMPLICATIONS  What  are the risk, side effects and possible complications? Generally speaking, most procedures are safe.  However, with any procedure there are risks, side effects, and the possibility of complications.  The risks and complications are dependent upon the sites that are lesioned, or the type of nerve block to be performed.  The closer the procedure is to the spine, the more serious the risks are.  Great care is taken when placing the radio frequency needles, block needles or lesioning probes, but sometimes complications can occur. 1. Infection: Any time there is an injection through the skin, there is a risk  of infection.  This is why sterile conditions are used for these blocks.  There are four possible types of infection. 1. Localized skin infection. 2. Central Nervous System Infection-This can be in the form of Meningitis, which can be deadly. 3. Epidural Infections-This can be in the form of an epidural abscess, which can cause pressure inside of the spine, causing compression of the spinal cord with subsequent paralysis. This would require an emergency surgery to decompress, and there are no guarantees that the patient would recover from the paralysis. 4. Discitis-This is an infection of the intervertebral discs.  It occurs in about 1% of discography procedures.  It is difficult to treat and it may lead to surgery.        2. Pain: the needles have to go through skin and soft tissues, will cause soreness.       3. Damage to internal structures:  The nerves to be lesioned may be near blood vessels or    other nerves which can be potentially damaged.       4. Bleeding: Bleeding is more common if the patient is taking blood thinners such as  aspirin, Coumadin, Ticiid, Plavix, etc., or if he/she have some genetic predisposition  such as hemophilia. Bleeding into the spinal canal can cause compression of the spinal  cord with subsequent paralysis.  This would require an emergency surgery to  decompress and there are no guarantees that the patient would recover from the  paralysis.       5. Pneumothorax:  Puncturing of a lung is a possibility, every time a needle is introduced in  the area of the chest or upper back.  Pneumothorax refers to free air around the  collapsed lung(s), inside of the thoracic cavity (chest cavity).  Another two possible  complications related to a similar event would include: Hemothorax and Chylothorax.   These are variations of the Pneumothorax, where instead of air around the collapsed  lung(s), you may have blood or chyle, respectively.       6. Spinal headaches: They may occur  with any procedures in the area of the spine.       7. Persistent CSF (Cerebro-Spinal Fluid) leakage: This is a rare problem, but may occur  with prolonged intrathecal or epidural catheters either due to the formation of a fistulous  track or a dural tear.       8. Nerve damage: By working so close to the spinal cord, there is always a possibility of  nerve damage, which could be as serious as a permanent spinal cord injury with  paralysis.       9. Death:  Although rare, severe deadly allergic reactions known as "Anaphylactic  reaction" can occur to any of the medications used.      10. Worsening of the symptoms:  We can always make thing worse.  What are the chances  of something like this happening? Chances of any of this occuring are extremely low.  By statistics, you have more of a chance of getting killed in a motor vehicle accident: while driving to the hospital than any of the above occurring .  Nevertheless, you should be aware that they are possibilities.  In general, it is similar to taking a shower.  Everybody knows that you can slip, hit your head and get killed.  Does that mean that you should not shower again?  Nevertheless always keep in mind that statistics do not mean anything if you happen to be on the wrong side of them.  Even if a procedure has a 1 (one) in a 1,000,000 (million) chance of going wrong, it you happen to be that one..Also, keep in mind that by statistics, you have more of a chance of having something go wrong when taking medications.  Who should not have this procedure? If you are on a blood thinning medication (e.g. Coumadin, Plavix, see list of "Blood Thinners"), or if you have an active infection going on, you should not have the procedure.  If you are taking any blood thinners, please inform your physician.  How should I prepare for this procedure?  Do not eat or drink anything at least six hours prior to the procedure.  Bring a driver with you .  It cannot be a  taxi.  Come accompanied by an adult that can drive you back, and that is strong enough to help you if your legs get weak or numb from the local anesthetic.  Take all of your medicines the morning of the procedure with just enough water to swallow them.  If you have diabetes, make sure that you are scheduled to have your procedure done first thing in the morning, whenever possible.  If you have diabetes, take only half of your insulin dose and notify our nurse that you have done so as soon as you arrive at the clinic.  If you are diabetic, but only take blood sugar pills (oral hypoglycemic), then do not take them on the morning of your procedure.  You may take them after you have had the procedure.  Do not take aspirin or any aspirin-containing medications, at least eleven (11) days prior to the procedure.  They may prolong bleeding.  Wear loose fitting clothing that may be easy to take off and that you would not mind if it got stained with Betadine or blood.  Do not wear any jewelry or perfume  Remove any nail coloring.  It will interfere with some of our monitoring equipment.  NOTE: Remember that this is not meant to be interpreted as a complete list of all possible complications.  Unforeseen problems may occur.  BLOOD THINNERS The following drugs contain aspirin or other products, which can cause increased bleeding during surgery and should not be taken for 2 weeks prior to and 1 week after surgery.  If you should need take something for relief of minor pain, you may take acetaminophen which is found in Tylenol,m Datril, Anacin-3 and Panadol. It is not blood thinner. The products listed below are.  Do not take any of the products listed below in addition to any listed on your instruction sheet.  A.P.C or A.P.C with Codeine Codeine Phosphate Capsules #3 Ibuprofen Ridaura  ABC compound Congesprin Imuran rimadil  Advil Cope Indocin Robaxisal  Alka-Seltzer Effervescent Pain Reliever and  Antacid Coricidin or Coricidin-D  Indomethacin Rufen  Alka-Seltzer plus Cold Medicine  Cosprin Ketoprofen S-A-C Tablets  Anacin Analgesic Tablets or Capsules Coumadin Korlgesic Salflex  Anacin Extra Strength Analgesic tablets or capsules CP-2 Tablets Lanoril Salicylate  Anaprox Cuprimine Capsules Levenox Salocol  Anexsia-D Dalteparin Magan Salsalate  Anodynos Darvon compound Magnesium Salicylate Sine-off  Ansaid Dasin Capsules Magsal Sodium Salicylate  Anturane Depen Capsules Marnal Soma  APF Arthritis pain formula Dewitt's Pills Measurin Stanback  Argesic Dia-Gesic Meclofenamic Sulfinpyrazone  Arthritis Bayer Timed Release Aspirin Diclofenac Meclomen Sulindac  Arthritis pain formula Anacin Dicumarol Medipren Supac  Analgesic (Safety coated) Arthralgen Diffunasal Mefanamic Suprofen  Arthritis Strength Bufferin Dihydrocodeine Mepro Compound Suprol  Arthropan liquid Dopirydamole Methcarbomol with Aspirin Synalgos  ASA tablets/Enseals Disalcid Micrainin Tagament  Ascriptin Doan's Midol Talwin  Ascriptin A/D Dolene Mobidin Tanderil  Ascriptin Extra Strength Dolobid Moblgesic Ticlid  Ascriptin with Codeine Doloprin or Doloprin with Codeine Momentum Tolectin  Asperbuf Duoprin Mono-gesic Trendar  Aspergum Duradyne Motrin or Motrin IB Triminicin  Aspirin plain, buffered or enteric coated Durasal Myochrisine Trigesic  Aspirin Suppositories Easprin Nalfon Trillsate  Aspirin with Codeine Ecotrin Regular or Extra Strength Naprosyn Uracel  Atromid-S Efficin Naproxen Ursinus  Auranofin Capsules Elmiron Neocylate Vanquish  Axotal Emagrin Norgesic Verin  Azathioprine Empirin or Empirin with Codeine Normiflo Vitamin E  Azolid Emprazil Nuprin Voltaren  Bayer Aspirin plain, buffered or children's or timed BC Tablets or powders Encaprin Orgaran Warfarin Sodium  Buff-a-Comp Enoxaparin Orudis Zorpin  Buff-a-Comp with Codeine Equegesic Os-Cal-Gesic   Buffaprin Excedrin plain, buffered or Extra Strength  Oxalid   Bufferin Arthritis Strength Feldene Oxphenbutazone   Bufferin plain or Extra Strength Feldene Capsules Oxycodone with Aspirin   Bufferin with Codeine Fenoprofen Fenoprofen Pabalate or Pabalate-SF   Buffets II Flogesic Panagesic   Buffinol plain or Extra Strength Florinal or Florinal with Codeine Panwarfarin   Buf-Tabs Flurbiprofen Penicillamine   Butalbital Compound Four-way cold tablets Penicillin   Butazolidin Fragmin Pepto-Bismol   Carbenicillin Geminisyn Percodan   Carna Arthritis Reliever Geopen Persantine   Carprofen Gold's salt Persistin   Chloramphenicol Goody's Phenylbutazone   Chloromycetin Haltrain Piroxlcam   Clmetidine heparin Plaquenil   Cllnoril Hyco-pap Ponstel   Clofibrate Hydroxy chloroquine Propoxyphen         Before stopping any of these medications, be sure to consult the physician who ordered them.  Some, such as Coumadin (Warfarin) are ordered to prevent or treat serious conditions such as "deep thrombosis", "pumonary embolisms", and other heart problems.  The amount of time that you may need off of the medication may also vary with the medication and the reason for which you were taking it.  If you are taking any of these medications, please make sure you notify your pain physician before you undergo any procedures.          Facet Joint Block The facet joints connect the bones of the spine (vertebrae). They make it possible for you to bend, twist, and make other movements with your spine. They also keep you from bending too far, twisting too far, and making other excessive movements. A facet joint block is a procedure where a numbing medicine (anesthetic) is injected into a facet joint. Often, a type of anti-inflammatory medicine called a steroid is also injected. A facet joint block may be done to diagnose neck or back pain. If the pain gets better after a facet joint block, it means the pain is probably coming from the facet joint. If the pain does  not get better, it means the pain is probably not coming from the facet  joint. A facet joint block may also be done to relieve neck or back pain caused by an inflamed facet joint. A facet joint block is only done to relieve pain if the pain does not improve with other methods, such as medicine, exercise programs, and physical therapy. Tell a health care provider about:  Any allergies you have.  All medicines you are taking, including vitamins, herbs, eye drops, creams, and over-the-counter medicines.  Any problems you or family members have had with anesthetic medicines.  Any blood disorders you have.  Any surgeries you have had.  Any medical conditions you have.  Whether you are pregnant or may be pregnant. What are the risks? Generally, this is a safe procedure. However, problems may occur, including:  Bleeding.  Injury to a nerve near the injection site.  Pain at the injection site.  Weakness or numbness in areas controlled by nerves near the injection site.  Infection.  Temporary fluid retention.  Allergic reactions to medicines or dyes.  Injury to other structures or organs near the injection site.  What happens before the procedure?  Follow instructions from your health care provider about eating or drinking restrictions.  Ask your health care provider about: ? Changing or stopping your regular medicines. This is especially important if you are taking diabetes medicines or blood thinners. ? Taking medicines such as aspirin and ibuprofen. These medicines can thin your blood. Do not take these medicines before your procedure if your health care provider instructs you not to.  Do not take any new dietary supplements or medicines without asking your health care provider first.  Plan to have someone take you home after the procedure. What happens during the procedure?  You may need to remove your clothing and dress in an open-back gown.  The procedure will be done  while you are lying on an X-ray table. You will most likely be asked to lie on your stomach, but you may be asked to lie in a different position if an injection will be made in your neck.  Machines will be used to monitor your oxygen levels, heart rate, and blood pressure.  If an injection will be made in your neck, an IV tube will be inserted into one of your veins. Fluids and medicine will flow directly into your body through the IV tube.  The area over the facet joint where the injection will be made will be cleaned with soap. The surrounding skin will be covered with clean drapes.  A numbing medicine (local anesthetic) will be applied to your skin. Your skin may sting or burn for a moment.  A video X-ray machine (fluoroscopy) will be used to locate the joint. In some cases, a CT scan may be used.  A contrast dye may be injected into the facet joint area to help locate the joint.  When the joint is located, an anesthetic will be injected into the joint through the needle.  Your health care provider will ask you whether you feel pain relief. If you do feel relief, a steroid may be injected to provide pain relief for a longer period of time. If you do not feel relief or feel only partial relief, additional injections of an anesthetic may be made in other facet joints.  The needle will be removed.  Your skin will be cleaned.  A bandage (dressing) will be applied over each injection site. The procedure may vary among health care providers and hospitals. What happens after the procedure?  You will be observed for 15-30 minutes before being allowed to go home. This information is not intended to replace advice given to you by your health care provider. Make sure you discuss any questions you have with your health care provider. Document Released: 06/02/2006 Document Revised: 02/12/2015 Document Reviewed: 10/07/2014 [employer] Interactive Patient Education  [employer].

## 2016-10-14 DIAGNOSIS — Z933 Colostomy status: Secondary | ICD-10-CM | POA: Diagnosis not present

## 2016-10-18 ENCOUNTER — Ambulatory Visit (HOSPITAL_BASED_OUTPATIENT_CLINIC_OR_DEPARTMENT_OTHER): Payer: PPO | Admitting: Student in an Organized Health Care Education/Training Program

## 2016-10-18 ENCOUNTER — Encounter: Payer: Self-pay | Admitting: Student in an Organized Health Care Education/Training Program

## 2016-10-18 ENCOUNTER — Ambulatory Visit
Admission: RE | Admit: 2016-10-18 | Discharge: 2016-10-18 | Disposition: A | Discharge status: Home / Self Care | Payer: PPO | Source: Ambulatory Visit | Attending: Student in an Organized Health Care Education/Training Program | Admitting: Student in an Organized Health Care Education/Training Program

## 2016-10-18 VITALS — BP 158/77 | HR 54 | Temp 97.2°F | Resp 18 | Ht 72.0 in | Wt 165.0 lb

## 2016-10-18 DIAGNOSIS — Z880 Allergy status to penicillin: Secondary | ICD-10-CM | POA: Insufficient documentation

## 2016-10-18 DIAGNOSIS — Z9889 Other specified postprocedural states: Secondary | ICD-10-CM | POA: Diagnosis not present

## 2016-10-18 DIAGNOSIS — M47816 Spondylosis without myelopathy or radiculopathy, lumbar region: Secondary | ICD-10-CM

## 2016-10-18 DIAGNOSIS — M47819 Spondylosis without myelopathy or radiculopathy, site unspecified: Secondary | ICD-10-CM

## 2016-10-18 DIAGNOSIS — M5136 Other intervertebral disc degeneration, lumbar region: Secondary | ICD-10-CM | POA: Insufficient documentation

## 2016-10-18 DIAGNOSIS — Z881 Allergy status to other antibiotic agents status: Secondary | ICD-10-CM | POA: Diagnosis not present

## 2016-10-18 DIAGNOSIS — M79605 Pain in left leg: Secondary | ICD-10-CM | POA: Diagnosis not present

## 2016-10-18 DIAGNOSIS — M4690 Unspecified inflammatory spondylopathy, site unspecified: Secondary | ICD-10-CM

## 2016-10-18 DIAGNOSIS — Z933 Colostomy status: Secondary | ICD-10-CM | POA: Diagnosis not present

## 2016-10-18 DIAGNOSIS — M545 Low back pain: Secondary | ICD-10-CM | POA: Diagnosis not present

## 2016-10-18 MED ORDER — ROPIVACAINE HCL 2 MG/ML IJ SOLN
10.0000 mL | Freq: Once | INTRAMUSCULAR | Status: AC
  Administered 2016-10-18: 10 mL
  Filled 2016-10-18: qty 10

## 2016-10-18 MED ORDER — FENTANYL CITRATE (PF) 100 MCG/2ML IJ SOLN
25.0000 ug | INTRAMUSCULAR | Status: DC | PRN
  Administered 2016-10-18: 25 ug via INTRAVENOUS
  Filled 2016-10-18: qty 2

## 2016-10-18 MED ORDER — LIDOCAINE HCL (PF) 1 % IJ SOLN
10.0000 mL | Freq: Once | INTRAMUSCULAR | Status: AC
  Administered 2016-10-18: 5 mL
  Filled 2016-10-18: qty 10

## 2016-10-18 MED ORDER — LACTATED RINGERS IV SOLN
1000.0000 mL | Freq: Once | INTRAVENOUS | Status: AC
  Administered 2016-10-18: 1000 mL via INTRAVENOUS

## 2016-10-18 NOTE — Progress Notes (Signed)
Patient's Name: Todd Bennett  MRN: [id number]  Referring Provider: [name], MD  DOB: September 24, 1932  PCP: [name], MD  DOS: 10/18/2016  Note by: Gillis Santa, MD  Service setting: Ambulatory outpatient  Specialty: Interventional Pain Management  Patient type: Established  Location: [facility] (AMB) Pain Management Facility  Visit type: Interventional Procedure   Primary Reason for Visit: Interventional Pain Management Treatment. CC: Back Pain (low) and Leg Pain (left)  Procedure:  Anesthesia, Analgesia, Anxiolysis:  Type: Diagnostic Medial Branch Facet Block #2 Region: Lumbar Level:  L3, L4, L5, Medial Branch Level(s) Laterality: Bilateral  Type: Local Anesthesia with Moderate (Conscious) Sedation Local Anesthetic: Lidocaine 1% Route: Intravenous (IV) IV Access: Secured Sedation: Meaningful verbal contact was maintained at all times during the procedure  Indication(s): Analgesia and Anxiety  Indications: 1. Spondylosis without myelopathy or radiculopathy, lumbar region   2. Lumbar degenerative disc disease   3. Lumbar spondylosis   4. Facet arthropathy (HCC)    Pain Score: Pre-procedure: 7 /10 Post-procedure: 0-No pain/10  Pre-op Assessment:  [name] is a 83 y.o. (year old), male patient, seen today for interventional treatment. He  has a past surgical history that includes Ileostomy; Lithotripsy (03/2013); and Hernia repair (1993). [name] has a current medication list which includes the following prescription(s): vitamin c, aspirin ec, cholecalciferol, coq-10, dapsone, finasteride, finasteride, finasteride, fluoxetine, fluoxetine, fluoxetine, metronidazole, metronidazole, multi-vitamins, NONFORMULARY OR COMPOUNDED ITEM, fish oil, prednisolone acetate, prednisone, shingrix, tamsulosin, and venlafaxine xr, and the following Facility-Administered Medications: betamethasone acetate-betamethasone sodium phosphate, betamethasone acetate-betamethasone sodium phosphate, and fentanyl.  His primarily concern today is the Back Pain (low) and Leg Pain (left)  83 year old male with a past medical history of arthritis, hypertension, hyperlipidemia, lumbar degenerative disc disease who presents with axial low back pain with radiation to his posterior thighs, buttocks down to his knees. Patient's pain does not radiate below the knees. Patient denies any numbness tingling or burning. He describes it as more of an achy throb in his back that extends to his posterior thighs. Straight leg raise test was negative bilaterally. Pain with lumbar extension and facet loading and lateral rotation which is very similar to the chronic pain that he experiences. Patient's lumbar MRI shows severe spinal stenosis at L4-L5 secondary to facet arthropathy and hypertrophy and anterolisthesis of L4 on L5. There is also moderate right foraminal narrowing. Patient also has L5-S1 right foraminal impingement due to endplate spur. He has multilevel degenerative disc disease present most pronounced in the L3-L5 region.  Patient states greater than 80% pain relief for approximately 2-3 days after diagnostic facet block on 10/04/2016. Plan to proceed with second block and then radiofrequency ablation thereafter.  Plan: -Lumbar facet block #2, bilateral today -Radiofrequency ablation will schedule thereafter   Initial Vital Signs: There were no vitals taken for this visit. BMI: Estimated body mass index is 22.38 kg/m as calculated from the following:   Height as of this encounter: 6' (1.829 m).   Weight as of this encounter: 165 lb (74.8 kg).  Risk Assessment: Allergies: Reviewed. He is allergic to ciprofloxacin; clarithromycin; oxycodone; penicillins; and sulfur.  Allergy Precautions: None required Coagulopathies: Reviewed. None identified.  Blood-thinner therapy: None at this time Active Infection(s): Reviewed. None identified. [name] is afebrile  Site Confirmation: Mr. Vacha was asked to confirm the  procedure and laterality before marking the site Procedure checklist: Completed Consent: Before the procedure and under the influence of no sedative(s), amnesic(s), or anxiolytics, the patient was informed of the treatment options,  risks and possible complications. To fulfill our ethical and legal obligations, as recommended by the American Medical Association's Code of Ethics, I have informed the patient of my clinical impression; the nature and purpose of the treatment or procedure; the risks, benefits, and possible complications of the intervention; the alternatives, including doing nothing; the risk(s) and benefit(s) of the alternative treatment(s) or procedure(s); and the risk(s) and benefit(s) of doing nothing. The patient was provided information about the general risks and possible complications associated with the procedure. These may include, but are not limited to: failure to achieve desired goals, infection, bleeding, organ or nerve damage, allergic reactions, paralysis, and death. In addition, the patient was informed of those risks and complications associated to Spine-related procedures, such as failure to decrease pain; infection (i.e.: Meningitis, epidural or intraspinal abscess); bleeding (i.e.: epidural hematoma, subarachnoid hemorrhage, or any other type of intraspinal or peri-dural bleeding); organ or nerve damage (i.e.: Any type of peripheral nerve, nerve root, or spinal cord injury) with subsequent damage to sensory, motor, and/or autonomic systems, resulting in permanent pain, numbness, and/or weakness of one or several areas of the body; allergic reactions; (i.e.: anaphylactic reaction); and/or death. Furthermore, the patient was informed of those risks and complications associated with the medications. These include, but are not limited to: allergic reactions (i.e.: anaphylactic or anaphylactoid reaction(s)); adrenal axis suppression; blood sugar elevation that in diabetics may result  in ketoacidosis or comma; water retention that in patients with history of congestive heart failure may result in shortness of breath, pulmonary edema, and decompensation with resultant heart failure; weight gain; swelling or edema; medication-induced neural toxicity; particulate matter embolism and blood vessel occlusion with resultant organ, and/or nervous system infarction; and/or aseptic necrosis of one or more joints. Finally, the patient was informed that Medicine is not an exact science; therefore, there is also the possibility of unforeseen or unpredictable risks and/or possible complications that may result in a catastrophic outcome. The patient indicated having understood very clearly. We have given the patient no guarantees and we have made no promises. Enough time was given to the patient to ask questions, all of which were answered to the patient's satisfaction. Mr. Zylstra has indicated that he wanted to continue with the procedure. Attestation: I, the ordering provider, attest that I have discussed with the patient the benefits, risks, side-effects, alternatives, likelihood of achieving goals, and potential problems during recovery for the procedure that I have provided informed consent. Date: 10/18/2016; Time: 8:06 AM  Pre-Procedure Preparation:  Monitoring: As per clinic protocol. Respiration, ETCO2, SpO2, BP, heart rate and rhythm monitor placed and checked for adequate function Safety Precautions: Patient was assessed for positional comfort and pressure points before starting the procedure. Time-out: I initiated and conducted the "Time-out" before starting the procedure, as per protocol. The patient was asked to participate by confirming the accuracy of the "Time Out" information. Verification of the correct person, site, and procedure were performed and confirmed by me, the nursing staff, and the patient. "Time-out" conducted as per Joint Commission's Universal Protocol  (UP.01.01.01). "Time-out" Date & Time: 10/18/2016; 0853 hrs.  Description of Procedure Process:   Position: Prone Target Area: For Lumbar Facet blocks, the target is the groove formed by the junction of the transverse process and superior articular process. For the L5 dorsal ramus, the target is the notch between superior articular process and sacral ala. Approach: Paramedial approach. Area Prepped: Entire Posterior Lumbosacral Region Prepping solution: ChloraPrep (2% chlorhexidine gluconate and 70% isopropyl alcohol) Safety Precautions:  Aspiration looking for blood return was conducted prior to all injections. At no point did we inject any substances, as a needle was being advanced. No attempts were made at seeking any paresthesias. Safe injection practices and needle disposal techniques used. Medications properly checked for expiration dates. SDV (single dose vial) medications used. Description of the Procedure: Protocol guidelines were followed. The patient was placed in position over the fluoroscopy table. The target area was identified and the area prepped in the usual manner. Skin desensitized using vapocoolant spray. Skin & deeper tissues infiltrated with local anesthetic. Appropriate amount of time allowed to pass for local anesthetics to take effect. The procedure needle was introduced through the skin, ipsilateral to the reported pain, and advanced to the target area. Employing the "Medial Branch Technique", the needles were advanced to the angle made by the superior and medial portion of the transverse process, and the lateral and inferior portion of the superior articulating process of the targeted vertebral bodies. This area is known as "Burton's Eye" or the "Eye of the [place] Dog". A procedure needle was introduced through the skin, and this time advanced to the angle made by the superior and medial border of the sacral ala, and the lateral border of the S1 vertebral body.Negative aspiration  confirmed. Solution injected in intermittent fashion, asking for systemic symptoms every 0.5cc of injectate. The needles were then removed and the area cleansed, making sure to leave some of the prepping solution back to take advantage of its long term bactericidal properties.   Illustration of the posterior view of the lumbar spine and the posterior neural structures. Laminae of L2 through S1 are labeled. DPRL5, dorsal primary ramus of L5; DPRS1, dorsal primary ramus of S1; DPR3, dorsal primary ramus of L3; FJ, facet (zygapophyseal) joint L3-L4; I, inferior articular process of L4; LB1, lateral branch of dorsal primary ramus of L1; IAB, inferior articular branches from L3 medial branch (supplies L4-L5 facet joint); IBP, intermediate branch plexus; MB3, medial branch of dorsal primary ramus of L3; NR3, third lumbar nerve root; S, superior articular process of L5; SAB, superior articular branches from L4 (supplies L4-5 facet joint also); TP3, transverse process of L3.  Vitals:   10/18/16 0908 10/18/16 0919 10/18/16 0928 10/18/16 0938  BP: 140/80 (!) 156/70 (!) 154/65 (!) 158/77  Pulse: 66 (!) 53 (!) 55 (!) 54  Resp: 20 18 18 18   Temp:  97.8 F (36.6 C)  (!) 97.2 F (36.2 C)  TempSrc:      SpO2: 92% 93% 94% 95%  Weight:      Height:        Start Time: 0854 hrs. End Time: 0908 hrs. Materials:  Needle(s) Type: Regular needle Gauge: 22G Length: 3.5-in Medication(s): We administered lactated ringers, fentaNYL, ropivacaine (PF) 2 mg/mL (0.2%), and lidocaine (PF). Please see chart orders for dosing details.  Imaging Guidance (Spinal):  Type of Imaging Technique: Fluoroscopy Guidance (Spinal) Indication(s): Assistance in needle guidance and placement for procedures requiring needle placement in or near specific anatomical locations not easily accessible without such assistance. Exposure Time: Please see nurses notes. Contrast: None used. Fluoroscopic Guidance: I was personally present during  the use of fluoroscopy. "Tunnel Vision Technique" used to obtain the best possible view of the target area. Parallax error corrected before commencing the procedure. "Direction-depth-direction" technique used to introduce the needle under continuous pulsed fluoroscopy. Once target was reached, antero-posterior, oblique, and lateral fluoroscopic projection used confirm needle placement in all planes. Images permanently stored in EMR.  Interpretation: No contrast injected. I personally interpreted the imaging intraoperatively. Adequate needle placement confirmed in multiple planes. Permanent images saved into the patient's record.  Antibiotic Prophylaxis:  Indication(s): None identified Antibiotic given: None  Post-operative Assessment:  EBL: None Complications: No immediate post-treatment complications observed by team, or reported by patient. Note: The patient tolerated the entire procedure well. A repeat set of vitals were taken after the procedure and the patient was kept under observation following institutional policy, for this type of procedure. Post-procedural neurological assessment was performed, showing return to baseline, prior to discharge. The patient was provided with post-procedure discharge instructions, including a section on how to identify potential problems. Should any problems arise concerning this procedure, the patient was given instructions to immediately contact us, at any time, without hesitation. In any case, we plan to contact the patient by telephone for a follow-up status report regarding this interventional procedure. Comments:  No additional relevant information.  Plan of Care  Disposition: Discharge home  Discharge Date & Time: 10/18/2016; 0945 hrs.  5/5 strength b/l LE Plan to return for radiofrequency ablation.  Physician-requested Follow-up:  Return for Procedure.  Future Appointments Date Time Provider [place]  04/01/2017 9:00 AM Hollice Espy, MD  [facility] None    Imaging Orders     DG C-Arm 1-60 Min-No Report  Procedure Orders     Radiofrequency,Lumbar  Medications ordered for procedure: Meds ordered this encounter  Medications  . lactated ringers infusion 1,000 mL  . fentaNYL (SUBLIMAZE) injection 25-50 mcg    Make sure Narcan is available in the pyxis when using this medication. In the event of respiratory depression (RR< 8/min): Titrate NARCAN (naloxone) in increments of 0.1 to 0.2 mg IV at 2-3 minute intervals, until desired degree of reversal.  . ropivacaine (PF) 2 mg/mL (0.2%) (NAROPIN) injection 10 mL  . lidocaine (PF) (XYLOCAINE) 1 % injection 10 mL   Medications administered: We administered lactated ringers, fentaNYL, ropivacaine (PF) 2 mg/mL (0.2%), and lidocaine (PF).  See the medical record for exact dosing, route, and time of administration.  New Prescriptions   No medications on file   Primary Care Physician: [name], MD Location: [facility] Note by: Gillis Santa, MD Date: 10/18/2016; Time: 10:23 AM  Disclaimer:  Medicine is not an exact science. The only guarantee in medicine is that nothing is guaranteed. It is important to note that the decision to proceed with this intervention was based on the information collected from the patient. The Data and conclusions were drawn from the patient's questionnaire, the interview, and the physical examination. Because the information was provided in large part by the patient, it cannot be guaranteed that it has not been purposely or unconsciously manipulated. Every effort has been made to obtain as much relevant data as possible for this evaluation. It is important to note that the conclusions that lead to this procedure are derived in large part from the available data. Always take into account that the treatment will also be dependent on availability of resources and existing treatment guidelines, considered by other Pain Management  Practitioners as being common knowledge and practice, at the time of the intervention. For Medico-Legal purposes, it is also important to point out that variation in procedural techniques and pharmacological choices are the acceptable norm. The indications, contraindications, technique, and results of the above procedure should only be interpreted and judged by a Board-Certified Interventional Pain Specialist with extensive familiarity and expertise in the same exact procedure and technique.

## 2016-10-18 NOTE — Patient Instructions (Signed)
Pain Management Discharge Instructions  General Discharge Instructions :  If you need to reach your doctor call: Monday-Friday 8:00 am - 4:00 pm at [phone] or toll free [phone].  After clinic hours [phone] to have operator reach doctor.  Bring all of your medication bottles to all your appointments in the pain clinic.  To cancel or reschedule your appointment with Pain Management please remember to call 24 hours in advance to avoid a fee.  Refer to the educational materials which you have been given on: General Risks, I had my Procedure. Discharge Instructions, Post Sedation.  Post Procedure Instructions:  The drugs you were given will stay in your system until tomorrow, so for the next 24 hours you should not drive, make any legal decisions or drink any alcoholic beverages.  You may eat anything you prefer, but it is better to start with liquids then soups and crackers, and gradually work up to solid foods.  Please notify your doctor immediately if you have any unusual bleeding, trouble breathing or pain that is not related to your normal pain.  Depending on the type of procedure that was done, some parts of your body may feel week and/or numb.  This usually clears up by tonight or the next day.  Walk with the use of an assistive device or accompanied by an adult for the 24 hours.  You may use ice on the affected area for the first 24 hours.  Put ice in a Ziploc bag and cover with a towel and place against area 15 minutes on 15 minutes off.  You may switch to heat after 24 hours.GENERAL RISKS AND COMPLICATIONS  What are the risk, side effects and possible complications? Generally speaking, most procedures are safe.  However, with any procedure there are risks, side effects, and the possibility of complications.  The risks and complications are dependent upon the sites that are lesioned, or the type of nerve block to be performed.  The closer the procedure is to the spine,  the more serious the risks are.  Great care is taken when placing the radio frequency needles, block needles or lesioning probes, but sometimes complications can occur. 1. Infection: Any time there is an injection through the skin, there is a risk of infection.  This is why sterile conditions are used for these blocks.  There are four possible types of infection. 1. Localized skin infection. 2. Central Nervous System Infection-This can be in the form of Meningitis, which can be deadly. 3. Epidural Infections-This can be in the form of an epidural abscess, which can cause pressure inside of the spine, causing compression of the spinal cord with subsequent paralysis. This would require an emergency surgery to decompress, and there are no guarantees that the patient would recover from the paralysis. 4. Discitis-This is an infection of the intervertebral discs.  It occurs in about 1% of discography procedures.  It is difficult to treat and it may lead to surgery.        2. Pain: the needles have to go through skin and soft tissues, will cause soreness.       3. Damage to internal structures:  The nerves to be lesioned may be near blood vessels or    other nerves which can be potentially damaged.       4. Bleeding: Bleeding is more common if the patient is taking blood thinners such as  aspirin, Coumadin, Ticiid, Plavix, etc., or if he/she have some genetic predisposition  such as   hemophilia. Bleeding into the spinal canal can cause compression of the spinal  cord with subsequent paralysis.  This would require an emergency surgery to  decompress and there are no guarantees that the patient would recover from the  paralysis.       5. Pneumothorax:  Puncturing of a lung is a possibility, every time a needle is introduced in  the area of the chest or upper back.  Pneumothorax refers to free air around the  collapsed lung(s), inside of the thoracic cavity (chest cavity).  Another two possible  complications  related to a similar event would include: Hemothorax and Chylothorax.   These are variations of the Pneumothorax, where instead of air around the collapsed  lung(s), you may have blood or chyle, respectively.       6. Spinal headaches: They may occur with any procedures in the area of the spine.       7. Persistent CSF (Cerebro-Spinal Fluid) leakage: This is a rare problem, but may occur  with prolonged intrathecal or epidural catheters either due to the formation of a fistulous  track or a dural tear.       8. Nerve damage: By working so close to the spinal cord, there is always a possibility of  nerve damage, which could be as serious as a permanent spinal cord injury with  paralysis.       9. Death:  Although rare, severe deadly allergic reactions known as "Anaphylactic  reaction" can occur to any of the medications used.      10. Worsening of the symptoms:  We can always make thing worse.  What are the chances of something like this happening? Chances of any of this occuring are extremely low.  By statistics, you have more of a chance of getting killed in a motor vehicle accident: while driving to the hospital than any of the above occurring .  Nevertheless, you should be aware that they are possibilities.  In general, it is similar to taking a shower.  Everybody knows that you can slip, hit your head and get killed.  Does that mean that you should not shower again?  Nevertheless always keep in mind that statistics do not mean anything if you happen to be on the wrong side of them.  Even if a procedure has a 1 (one) in a 1,000,000 (million) chance of going wrong, it you happen to be that one..Also, keep in mind that by statistics, you have more of a chance of having something go wrong when taking medications.  Who should not have this procedure? If you are on a blood thinning medication (e.g. Coumadin, Plavix, see list of "Blood Thinners"), or if you have an active infection going on, you should not  have the procedure.  If you are taking any blood thinners, please inform your physician.  How should I prepare for this procedure?  Do not eat or drink anything at least six hours prior to the procedure.  Bring a driver with you .  It cannot be a taxi.  Come accompanied by an adult that can drive you back, and that is strong enough to help you if your legs get weak or numb from the local anesthetic.  Take all of your medicines the morning of the procedure with just enough water to swallow them.  If you have diabetes, make sure that you are scheduled to have your procedure done first thing in the morning, whenever possible.  If you have diabetes,   take only half of your insulin dose and notify our nurse that you have done so as soon as you arrive at the clinic.  If you are diabetic, but only take blood sugar pills (oral hypoglycemic), then do not take them on the morning of your procedure.  You may take them after you have had the procedure.  Do not take aspirin or any aspirin-containing medications, at least eleven (11) days prior to the procedure.  They may prolong bleeding.  Wear loose fitting clothing that may be easy to take off and that you would not mind if it got stained with Betadine or blood.  Do not wear any jewelry or perfume  Remove any nail coloring.  It will interfere with some of our monitoring equipment.  NOTE: Remember that this is not meant to be interpreted as a complete list of all possible complications.  Unforeseen problems may occur.  BLOOD THINNERS The following drugs contain aspirin or other products, which can cause increased bleeding during surgery and should not be taken for 2 weeks prior to and 1 week after surgery.  If you should need take something for relief of minor pain, you may take acetaminophen which is found in Tylenol,m Datril, Anacin-3 and Panadol. It is not blood thinner. The products listed below are.  Do not take any of the products listed below  in addition to any listed on your instruction sheet.  A.P.C or A.P.C with Codeine Codeine Phosphate Capsules #3 Ibuprofen Ridaura  ABC compound Congesprin Imuran rimadil  Advil Cope Indocin Robaxisal  Alka-Seltzer Effervescent Pain Reliever and Antacid Coricidin or Coricidin-D  Indomethacin Rufen  Alka-Seltzer plus Cold Medicine Cosprin Ketoprofen S-A-C Tablets  Anacin Analgesic Tablets or Capsules Coumadin Korlgesic Salflex  Anacin Extra Strength Analgesic tablets or capsules CP-2 Tablets Lanoril Salicylate  Anaprox Cuprimine Capsules Levenox Salocol  Anexsia-D Dalteparin Magan Salsalate  Anodynos Darvon compound Magnesium Salicylate Sine-off  Ansaid Dasin Capsules Magsal Sodium Salicylate  Anturane Depen Capsules Marnal Soma  APF Arthritis pain formula Dewitt's Pills Measurin Stanback  Argesic Dia-Gesic Meclofenamic Sulfinpyrazone  Arthritis Bayer Timed Release Aspirin Diclofenac Meclomen Sulindac  Arthritis pain formula Anacin Dicumarol Medipren Supac  Analgesic (Safety coated) Arthralgen Diffunasal Mefanamic Suprofen  Arthritis Strength Bufferin Dihydrocodeine Mepro Compound Suprol  Arthropan liquid Dopirydamole Methcarbomol with Aspirin Synalgos  ASA tablets/Enseals Disalcid Micrainin Tagament  Ascriptin Doan's Midol Talwin  Ascriptin A/D Dolene Mobidin Tanderil  Ascriptin Extra Strength Dolobid Moblgesic Ticlid  Ascriptin with Codeine Doloprin or Doloprin with Codeine Momentum Tolectin  Asperbuf Duoprin Mono-gesic Trendar  Aspergum Duradyne Motrin or Motrin IB Triminicin  Aspirin plain, buffered or enteric coated Durasal Myochrisine Trigesic  Aspirin Suppositories Easprin Nalfon Trillsate  Aspirin with Codeine Ecotrin Regular or Extra Strength Naprosyn Uracel  Atromid-S Efficin Naproxen Ursinus  Auranofin Capsules Elmiron Neocylate Vanquish  Axotal Emagrin Norgesic Verin  Azathioprine Empirin or Empirin with Codeine Normiflo Vitamin E  Azolid Emprazil Nuprin Voltaren  Bayer  Aspirin plain, buffered or children's or timed BC Tablets or powders Encaprin Orgaran Warfarin Sodium  Buff-a-Comp Enoxaparin Orudis Zorpin  Buff-a-Comp with Codeine Equegesic Os-Cal-Gesic   Buffaprin Excedrin plain, buffered or Extra Strength Oxalid   Bufferin Arthritis Strength Feldene Oxphenbutazone   Bufferin plain or Extra Strength Feldene Capsules Oxycodone with Aspirin   Bufferin with Codeine Fenoprofen Fenoprofen Pabalate or Pabalate-SF   Buffets II Flogesic Panagesic   Buffinol plain or Extra Strength Florinal or Florinal with Codeine Panwarfarin   Buf-Tabs Flurbiprofen Penicillamine   Butalbital Compound Four-way cold tablets   Penicillin   Butazolidin Fragmin Pepto-Bismol   Carbenicillin Geminisyn Percodan   Carna Arthritis Reliever Geopen Persantine   Carprofen Gold's salt Persistin   Chloramphenicol Goody's Phenylbutazone   Chloromycetin Haltrain Piroxlcam   Clmetidine heparin Plaquenil   Cllnoril Hyco-pap Ponstel   Clofibrate Hydroxy chloroquine Propoxyphen         Before stopping any of these medications, be sure to consult the physician who ordered them.  Some, such as Coumadin (Warfarin) are ordered to prevent or treat serious conditions such as "deep thrombosis", "pumonary embolisms", and other heart problems.  The amount of time that you may need off of the medication may also vary with the medication and the reason for which you were taking it.  If you are taking any of these medications, please make sure you notify your pain physician before you undergo any procedures.         Facet Blocks Patient Information  Description: The facets are joints in the spine between the vertebrae.  Like any joints in the body, facets can become irritated and painful.  Arthritis can also effect the facets.  By injecting steroids and local anesthetic in and around these joints, we can temporarily block the nerve supply to them.  Steroids act directly on irritated nerves and tissues  to reduce selling and inflammation which often leads to decreased pain.  Facet blocks may be done anywhere along the spine from the neck to the low back depending upon the location of your pain.   After numbing the skin with local anesthetic (like Novocaine), a small needle is passed onto the facet joints under x-ray guidance.  You may experience a sensation of pressure while this is being done.  The entire block usually lasts about 15-25 minutes.   Conditions which may be treated by facet blocks:   Low back/buttock pain  Neck/shoulder pain  Certain types of headaches  Preparation for the injection:  1. Do not eat any solid food or dairy products within 8 hours of your appointment. 2. You may drink clear liquid up to 3 hours before appointment.  Clear liquids include water, black coffee, juice or soda.  No milk or cream please. 3. You may take your regular medication, including pain medications, with a sip of water before your appointment.  Diabetics should hold regular insulin (if taken separately) and take 1/2 normal NPH dose the morning of the procedure.  Carry some sugar containing items with you to your appointment. 4. A driver must accompany you and be prepared to drive you home after your procedure. 5. Bring all your current medications with you. 6. An IV may be inserted and sedation may be given at the discretion of the physician. 7. A blood pressure cuff, EKG and other monitors will often be applied during the procedure.  Some patients may need to have extra oxygen administered for a short period. 8. You will be asked to provide medical information, including your allergies and medications, prior to the procedure.  We must know immediately if you are taking blood thinners (like Coumadin/Warfarin) or if you are allergic to IV iodine contrast (dye).  We must know if you could possible be pregnant.  Possible side-effects:   Bleeding from needle site  Infection (rare, may require  surgery)  Nerve injury (rare)  Numbness & tingling (temporary)  Difficulty urinating (rare, temporary)  Spinal headache (a headache worse with upright posture)  Light-headedness (temporary)  Pain at injection site (serveral days)  Decreased blood pressure (rare,   temporary)  Weakness in arm/leg (temporary)  Pressure sensation in back/neck (temporary)   Call if you experience:   Fever/chills associated with headache or increased back/neck pain  Headache worsened by an upright position  New onset, weakness or numbness of an extremity below the injection site  Hives or difficulty breathing (go to the emergency room)  Inflammation or drainage at the injection site(s)  Severe back/neck pain greater than usual  New symptoms which are concerning to you  Please note:  Although the local anesthetic injected can often make your back or neck feel good for several hours after the injection, the pain will likely return. It takes 3-7 days for steroids to work.  You may not notice any pain relief for at least one week.  If effective, we will often do a series of 2-3 injections spaced 3-6 weeks apart to maximally decrease your pain.  After the initial series, you may be a candidate for a more permanent nerve block of the facets.  If you have any questions, please call [phone] [facility] 

## 2016-10-18 NOTE — Progress Notes (Signed)
Safety precautions to be maintained throughout the outpatient stay will include: orient to surroundings, keep bed in low position, maintain call bell within reach at all times, provide assistance with transfer out of bed and ambulation.  

## 2016-10-19 ENCOUNTER — Telehealth: Payer: Self-pay | Admitting: *Deleted

## 2016-10-19 ENCOUNTER — Ambulatory Visit: Payer: PPO | Admitting: Student in an Organized Health Care Education/Training Program

## 2016-10-19 NOTE — Telephone Encounter (Signed)
No problems post procedure. 

## 2016-10-20 ENCOUNTER — Ambulatory Visit: Payer: PPO | Admitting: Student in an Organized Health Care Education/Training Program

## 2016-10-29 DIAGNOSIS — Z23 Encounter for immunization: Secondary | ICD-10-CM | POA: Diagnosis not present

## 2016-11-02 ENCOUNTER — Ambulatory Visit: Payer: PPO | Admitting: Student in an Organized Health Care Education/Training Program

## 2016-11-03 ENCOUNTER — Encounter: Payer: Self-pay | Admitting: Student in an Organized Health Care Education/Training Program

## 2016-11-03 ENCOUNTER — Ambulatory Visit
Payer: PPO | Attending: Student in an Organized Health Care Education/Training Program | Admitting: Student in an Organized Health Care Education/Training Program

## 2016-11-03 VITALS — BP 148/66 | HR 57 | Temp 97.9°F | Resp 16 | Ht 72.0 in | Wt 165.0 lb

## 2016-11-03 DIAGNOSIS — I10 Essential (primary) hypertension: Secondary | ICD-10-CM | POA: Insufficient documentation

## 2016-11-03 DIAGNOSIS — Z833 Family history of diabetes mellitus: Secondary | ICD-10-CM | POA: Insufficient documentation

## 2016-11-03 DIAGNOSIS — Z885 Allergy status to narcotic agent status: Secondary | ICD-10-CM | POA: Insufficient documentation

## 2016-11-03 DIAGNOSIS — M47819 Spondylosis without myelopathy or radiculopathy, site unspecified: Secondary | ICD-10-CM | POA: Diagnosis not present

## 2016-11-03 DIAGNOSIS — Z7982 Long term (current) use of aspirin: Secondary | ICD-10-CM | POA: Insufficient documentation

## 2016-11-03 DIAGNOSIS — N400 Enlarged prostate without lower urinary tract symptoms: Secondary | ICD-10-CM | POA: Insufficient documentation

## 2016-11-03 DIAGNOSIS — Z87891 Personal history of nicotine dependence: Secondary | ICD-10-CM | POA: Diagnosis not present

## 2016-11-03 DIAGNOSIS — M5136 Other intervertebral disc degeneration, lumbar region: Secondary | ICD-10-CM | POA: Diagnosis not present

## 2016-11-03 DIAGNOSIS — H269 Unspecified cataract: Secondary | ICD-10-CM | POA: Insufficient documentation

## 2016-11-03 DIAGNOSIS — Z881 Allergy status to other antibiotic agents status: Secondary | ICD-10-CM | POA: Diagnosis not present

## 2016-11-03 DIAGNOSIS — M4316 Spondylolisthesis, lumbar region: Secondary | ICD-10-CM | POA: Insufficient documentation

## 2016-11-03 DIAGNOSIS — M47816 Spondylosis without myelopathy or radiculopathy, lumbar region: Secondary | ICD-10-CM | POA: Diagnosis not present

## 2016-11-03 DIAGNOSIS — Z932 Ileostomy status: Secondary | ICD-10-CM | POA: Diagnosis not present

## 2016-11-03 DIAGNOSIS — Z87442 Personal history of urinary calculi: Secondary | ICD-10-CM | POA: Insufficient documentation

## 2016-11-03 DIAGNOSIS — G8929 Other chronic pain: Secondary | ICD-10-CM | POA: Diagnosis not present

## 2016-11-03 DIAGNOSIS — Z882 Allergy status to sulfonamides status: Secondary | ICD-10-CM | POA: Diagnosis not present

## 2016-11-03 DIAGNOSIS — Z880 Allergy status to penicillin: Secondary | ICD-10-CM | POA: Diagnosis not present

## 2016-11-03 DIAGNOSIS — Z9889 Other specified postprocedural states: Secondary | ICD-10-CM | POA: Insufficient documentation

## 2016-11-03 DIAGNOSIS — M48061 Spinal stenosis, lumbar region without neurogenic claudication: Secondary | ICD-10-CM | POA: Insufficient documentation

## 2016-11-03 DIAGNOSIS — F419 Anxiety disorder, unspecified: Secondary | ICD-10-CM | POA: Insufficient documentation

## 2016-11-03 DIAGNOSIS — Z79899 Other long term (current) drug therapy: Secondary | ICD-10-CM | POA: Diagnosis not present

## 2016-11-03 DIAGNOSIS — E785 Hyperlipidemia, unspecified: Secondary | ICD-10-CM | POA: Diagnosis not present

## 2016-11-03 NOTE — Progress Notes (Signed)
Safety precautions to be maintained throughout the outpatient stay will include: orient to surroundings, keep bed in low position, maintain call bell within reach at all times, provide assistance with transfer out of bed and ambulation.   Patient reports having flu shot administered approx 2 months ago.

## 2016-11-03 NOTE — Patient Instructions (Signed)
Radiofrequency Lesioning Radiofrequency lesioning is a procedure that is performed to relieve pain. The procedure is often used for back, neck, or arm pain. Radiofrequency lesioning involves the use of a machine that creates radio waves to make heat. During the procedure, the heat is applied to the nerve that carries the pain signal. The heat damages the nerve and interferes with the pain signal. Pain relief usually starts about 2 weeks after the procedure and lasts for 6 months to 1 year. Tell a health care provider about:  Any allergies you have.  All medicines you are taking, including vitamins, herbs, eye drops, creams, and over-the-counter medicines.  Any problems you or family members have had with anesthetic medicines.  Any blood disorders you have.  Any surgeries you have had.  Any medical conditions you have.  Whether you are pregnant or may be pregnant. What are the risks? Generally, this is a safe procedure. However, problems may occur, including:  Pain or soreness at the injection site.  Infection at the injection site.  Damage to nerves or blood vessels.  What happens before the procedure?  Ask your health care provider about: ? Changing or stopping your regular medicines. This is especially important if you are taking diabetes medicines or blood thinners. ? Taking medicines such as aspirin and ibuprofen. These medicines can thin your blood. Do not take these medicines before your procedure if your health care provider instructs you not to.  Follow instructions from your health care provider about eating or drinking restrictions.  Plan to have someone take you home after the procedure.  If you go home right after the procedure, plan to have someone with you for 24 hours. What happens during the procedure?  You will be given one or more of the following: ? A medicine to help you relax (sedative). ? A medicine to numb the area (local anesthetic).  You will be  awake during the procedure. You will need to be able to talk with the health care provider during the procedure.  With the help of a type of X-ray (fluoroscopy), the health care provider will insert a radiofrequency needle into the area to be treated.  Next, a wire that carries the radio waves (electrode) will be put through the radiofrequency needle. An electrical pulse will be sent through the electrode to verify the correct nerve. You will feel a tingling sensation, and you may have muscle twitching.  Then, the tissue that is around the needle tip will be heated by an electric current that is passed using the radiofrequency machine. This will numb the nerves.  A bandage (dressing) will be put on the insertion area after the procedure is done. The procedure may vary among health care providers and hospitals. What happens after the procedure?  Your blood pressure, heart rate, breathing rate, and blood oxygen level will be monitored often until the medicines you were given have worn off.  Return to your normal activities as directed by your health care provider. This information is not intended to replace advice given to you by your health care provider. Make sure you discuss any questions you have with your health care provider. Document Released: 09/09/2010 Document Revised: 06/19/2015 Document Reviewed: 02/18/2014 Else[employer] Interactive Patient Education  2018 [place]  What are the risk, side effects and possible complications? Generally speaking, most procedures are safe.  However, with any procedure there are risks, side effects, and the possibility of complications.  The risks and complications  are dependent upon the sites that are lesioned, or the type of nerve block to be performed.  The closer the procedure is to the spine, the more serious the risks are.  Great care is taken when placing the radio frequency needles, block needles or lesioning  probes, but sometimes complications can occur. 1. Infection: Any time there is an injection through the skin, there is a risk of infection.  This is why sterile conditions are used for these blocks.  There are four possible types of infection. 1. Localized skin infection. 2. Central Nervous System Infection-This can be in the form of Meningitis, which can be deadly. 3. Epidural Infections-This can be in the form of an epidural abscess, which can cause pressure inside of the spine, causing compression of the spinal cord with subsequent paralysis. This would require an emergency surgery to decompress, and there are no guarantees that the patient would recover from the paralysis. 4. Discitis-This is an infection of the intervertebral discs.  It occurs in about 1% of discography procedures.  It is difficult to treat and it may lead to surgery.        2. Pain: the needles have to go through skin and soft tissues, will cause soreness.       3. Damage to internal structures:  The nerves to be lesioned may be near blood vessels or    other nerves which can be potentially damaged.       4. Bleeding: Bleeding is more common if the patient is taking blood thinners such as  aspirin, Coumadin, Ticiid, Plavix, etc., or if he/she have some genetic predisposition  such as hemophilia. Bleeding into the spinal canal can cause compression of the spinal  cord with subsequent paralysis.  This would require an emergency surgery to  decompress and there are no guarantees that the patient would recover from the  paralysis.       5. Pneumothorax:  Puncturing of a lung is a possibility, every time a needle is introduced in  the area of the chest or upper back.  Pneumothorax refers to free air around the  collapsed lung(s), inside of the thoracic cavity (chest cavity).  Another two possible  complications related to a similar event would include: Hemothorax and Chylothorax.   These are variations of the Pneumothorax, where instead  of air around the collapsed  lung(s), you may have blood or chyle, respectively.       6. Spinal headaches: They may occur with any procedures in the area of the spine.       7. Persistent CSF (Cerebro-Spinal Fluid) leakage: This is a rare problem, but may occur  with prolonged intrathecal or epidural catheters either due to the formation of a fistulous  track or a dural tear.       8. Nerve damage: By working so close to the spinal cord, there is always a possibility of  nerve damage, which could be as serious as a permanent spinal cord injury with  paralysis.       9. Death:  Although rare, severe deadly allergic reactions known as "Anaphylactic  reaction" can occur to any of the medications used.      10. Worsening of the symptoms:  We can always make thing worse.  What are the chances of something like this happening? Chances of any of this occuring are extremely low.  By statistics, you have more of a chance of getting killed in a motor vehicle accident: while driving to  the hospital than any of the above occurring .  Nevertheless, you should be aware that they are possibilities.  In general, it is similar to taking a shower.  Everybody knows that you can slip, hit your head and get killed.  Does that mean that you should not shower again?  Nevertheless always keep in mind that statistics do not mean anything if you happen to be on the wrong side of them.  Even if a procedure has a 1 (one) in a 1,000,000 (million) chance of going wrong, it you happen to be that one..Also, keep in mind that by statistics, you have more of a chance of having something go wrong when taking medications.  Who should not have this procedure? If you are on a blood thinning medication (e.g. Coumadin, Plavix, see list of "Blood Thinners"), or if you have an active infection going on, you should not have the procedure.  If you are taking any blood thinners, please inform your physician.  How should I prepare for this  procedure?  Do not eat or drink anything at least six hours prior to the procedure.  Bring a driver with you .  It cannot be a taxi.  Come accompanied by an adult that can drive you back, and that is strong enough to help you if your legs get weak or numb from the local anesthetic.  Take all of your medicines the morning of the procedure with just enough water to swallow them.  If you have diabetes, make sure that you are scheduled to have your procedure done first thing in the morning, whenever possible.  If you have diabetes, take only half of your insulin dose and notify our nurse that you have done so as soon as you arrive at the clinic.  If you are diabetic, but only take blood sugar pills (oral hypoglycemic), then do not take them on the morning of your procedure.  You may take them after you have had the procedure.  Do not take aspirin or any aspirin-containing medications, at least eleven (11) days prior to the procedure.  They may prolong bleeding.  Wear loose fitting clothing that may be easy to take off and that you would not mind if it got stained with Betadine or blood.  Do not wear any jewelry or perfume  Remove any nail coloring.  It will interfere with some of our monitoring equipment.  NOTE: Remember that this is not meant to be interpreted as a complete list of all possible complications.  Unforeseen problems may occur.  BLOOD THINNERS The following drugs contain aspirin or other products, which can cause increased bleeding during surgery and should not be taken for 2 weeks prior to and 1 week after surgery.  If you should need take something for relief of minor pain, you may take acetaminophen which is found in Tylenol,m Datril, Anacin-3 and Panadol. It is not blood thinner. The products listed below are.  Do not take any of the products listed below in addition to any listed on your instruction sheet.  A.P.C or A.P.C with Codeine Codeine Phosphate Capsules #3  Ibuprofen Ridaura  ABC compound Congesprin Imuran rimadil  Advil Cope Indocin Robaxisal  Alka-Seltzer Effervescent Pain Reliever and Antacid Coricidin or Coricidin-D  Indomethacin Rufen  Alka-Seltzer plus Cold Medicine Cosprin Ketoprofen S-A-C Tablets  Anacin Analgesic Tablets or Capsules Coumadin Korlgesic Salflex  Anacin Extra Strength Analgesic tablets or capsules CP-2 Tablets Lanoril Salicylate  Anaprox Cuprimine Capsules Levenox Salocol  Anexsia-D Dalteparin  Magan Salsalate  Anodynos Darvon compound Magnesium Salicylate Sine-off  Ansaid Dasin Capsules Magsal Sodium Salicylate  Anturane Depen Capsules Marnal Soma  APF Arthritis pain formula Dewitt's Pills Measurin Stanback  Argesic Dia-Gesic Meclofenamic Sulfinpyrazone  Arthritis Bayer Timed Release Aspirin Diclofenac Meclomen Sulindac  Arthritis pain formula Anacin Dicumarol Medipren Supac  Analgesic (Safety coated) Arthralgen Diffunasal Mefanamic Suprofen  Arthritis Strength Bufferin Dihydrocodeine Mepro Compound Suprol  Arthropan liquid Dopirydamole Methcarbomol with Aspirin Synalgos  ASA tablets/Enseals Disalcid Micrainin Tagament  Ascriptin Doan's Midol Talwin  Ascriptin A/D Dolene Mobidin Tanderil  Ascriptin Extra Strength Dolobid Moblgesic Ticlid  Ascriptin with Codeine Doloprin or Doloprin with Codeine Momentum Tolectin  Asperbuf Duoprin Mono-gesic Trendar  Aspergum Duradyne Motrin or Motrin IB Triminicin  Aspirin plain, buffered or enteric coated Durasal Myochrisine Trigesic  Aspirin Suppositories Easprin Nalfon Trillsate  Aspirin with Codeine Ecotrin Regular or Extra Strength Naprosyn Uracel  Atromid-S Efficin Naproxen Ursinus  Auranofin Capsules Elmiron Neocylate Vanquish  Axotal Emagrin Norgesic Verin  Azathioprine Empirin or Empirin with Codeine Normiflo Vitamin E  Azolid Emprazil Nuprin Voltaren  Bayer Aspirin plain, buffered or children's or timed BC Tablets or powders Encaprin Orgaran Warfarin Sodium   Buff-a-Comp Enoxaparin Orudis Zorpin  Buff-a-Comp with Codeine Equegesic Os-Cal-Gesic   Buffaprin Excedrin plain, buffered or Extra Strength Oxalid   Bufferin Arthritis Strength Feldene Oxphenbutazone   Bufferin plain or Extra Strength Feldene Capsules Oxycodone with Aspirin   Bufferin with Codeine Fenoprofen Fenoprofen Pabalate or Pabalate-SF   Buffets II Flogesic Panagesic   Buffinol plain or Extra Strength Florinal or Florinal with Codeine Panwarfarin   Buf-Tabs Flurbiprofen Penicillamine   Butalbital Compound Four-way cold tablets Penicillin   Butazolidin Fragmin Pepto-Bismol   Carbenicillin Geminisyn Percodan   Carna Arthritis Reliever Geopen Persantine   Carprofen Gold's salt Persistin   Chloramphenicol Goody's Phenylbutazone   Chloromycetin Haltrain Piroxlcam   Clmetidine heparin Plaquenil   Cllnoril Hyco-pap Ponstel   Clofibrate Hydroxy chloroquine Propoxyphen         Before stopping any of these medications, be sure to consult the physician who ordered them.  Some, such as Coumadin (Warfarin) are ordered to prevent or treat serious conditions such as "deep thrombosis", "pumonary embolisms", and other heart problems.  The amount of time that you may need off of the medication may also vary with the medication and the reason for which you were taking it.  If you are taking any of these medications, please make sure you notify your pain physician before you undergo any procedures.

## 2016-11-03 NOTE — Progress Notes (Signed)
Patient's Name: Todd Bennett  MRN: [id number]  Referring Provider: [name], MD  DOB: 07-15-1932  PCP: [name], MD  DOS: 11/03/2016  Note by: Gillis Santa, MD  Service setting: Ambulatory outpatient  Specialty: Interventional Pain Management  Location: [facility] (AMB) Pain Management Facility    Patient type: Established   Primary Reason(s) for Visit: Encounter for post-procedure evaluation of chronic illness with mild to moderate exacerbation CC: Back Pain (lower back pain going down both legs)  HPI  Todd Bennett is an 83 y.o. year old, male patient, who comes today for a post-procedure evaluation. He has Anxiety; Benign prostatic hyperplasia with urinary obstruction; Benign prostatic hypertrophy without urinary obstruction; Bladder calculi; Calculus of kidney; Calculi, ureter; Clinical depression; Frank hematuria; and HLD (hyperlipidemia) on his problem list. His primarily concern today is the Back Pain (lower back pain going down both legs)  Pain Assessment: Location: Left, Right, Lower Back Radiating: down both legs to approx the knee on the back side of the leg Onset: More than a month ago Duration: Chronic pain Quality: Aching, Dull, Radiating, Shooting, Sore Severity: 8 /10 (self-reported pain score)  Note: Reported level is compatible with observation.                   When using our objective Pain Scale, levels between 6 and 10/10 are said to belong in an emergency room, as it progressively worsens from a 6/10, described as severely limiting, requiring emergency care not usually available at an outpatient pain management facility. At a 6/10 level, communication becomes difficult and requires great effort. Assistance to reach the emergency department may be required. Facial flushing and profuse sweating along with potentially dangerous increases in heart rate and blood pressure will be evident. Effect on ADL: back feels dull and sore but legs are really causing him a great deal of  pain, interrupting his sleep.  Timing: Constant Modifying factors: heat, arthritis tylenol  Todd Bennett comes in today for post-procedure evaluation after the treatment done on 10/18/2016.  Further details on both, my assessment(s), as well as the proposed treatment plan, please see below.  Post-Procedure Assessment  10/18/2016 Procedure:Bilateral lumbar facet blocks at L3, L4, L5 #2 Pre-procedure pain score:  7/10 Post-procedure pain score: 0/10         Influential Factors: BMI: 22.38 kg/m Intra-procedural challenges: None observed.         Assessment challenges: None detected.              Reported side-effects: None.        Post-procedural adverse reactions or complications: None reported         Sedation: Please see nurses note. When no sedatives are used, the analgesic levels obtained are directly associated to the effectiveness of the local anesthetics. However, when sedation is provided, the level of analgesia obtained during the initial 1 hour following the intervention, is believed to be the result of a combination of factors. These factors may include, but are not limited to: 1. The effectiveness of the local anesthetics used. 2. The effects of the analgesic(s) and/or anxiolytic(s) used. 3. The degree of discomfort experienced by the patient at the time of the procedure. 4. The patients ability and reliability in recalling and recording the events. 5. The presence and influence of possible secondary gains and/or psychosocial factors. Reported result: Relief experienced during the 1st hour after the procedure: 100 % (Ultra-Short Term Relief)            Interpretative  annotation: Clinically appropriate result. Analgesia during this period is likely to be Local Anesthetic and/or IV Sedative (Analgesic/Anxiolytic) related.          Effects of local anesthetic: The analgesic effects attained during this period are directly associated to the localized infiltration of local anesthetics and  therefore cary significant diagnostic value as to the etiological location, or anatomical origin, of the pain. Expected duration of relief is directly dependent on the pharmacodynamics of the local anesthetic used. Long-acting (4-6 hours) anesthetics used.  Reported result: Relief during the next 4 to 6 hour after the procedure: 40 % (Short-Term Relief)            Interpretative annotation: Clinically appropriate result. Analgesia during this period is likely to be Local Anesthetic-related.          Long-term benefit: Defined as the period of time past the expected duration of local anesthetics (1 hour for short-acting and 4-6 hours for long-acting). With the possible exception of prolonged sympathetic blockade from the local anesthetics, benefits during this period are typically attributed to, or associated with, other factors such as analgesic sensory neuropraxia, antiinflammatory effects, or beneficial biochemical changes provided by agents other than the local anesthetics.  Reported result: Extended relief following procedure: approximately 75% for 2-3 days post procedure (Long-Term Relief) Todd Bennett reports the axial pain improved more than the extremity pain. Interpretative annotation: Clinically appropriate result. Good relief. No permanent benefit expected. Inflammation plays a part in the etiology to the pain.          Current benefits: Defined as reported results that persistent at this point in time.   Analgesia: 50 % Todd Bennett reports improvement of axial symptoms. Function: Somewhat improved ROM: Somewhat improved Interpretative annotation: Recurrence of symptoms. No permanent benefit expected. Effective diagnostic intervention.          Interpretation: Results would suggest a successful diagnostic intervention.                  Plan:  Proceed with Radiofrequency Ablation for the purpose of attaining long-term benefits.       Laboratory Chemistry  Inflammation Markers (CRP: Acute  Phase) (ESR: Chronic Phase) No results found for: CRP, ESRSEDRATE               Renal Function Markers Lab Results  Component Value Date   BUN 21 (H) 02/20/2013   CREATININE 1.05 02/20/2013   GFRAA >60 02/20/2013   GFRNONAA >60 02/20/2013                 Hepatic Function Markers Lab Results  Component Value Date   AST 38 (H) 02/20/2013   ALT 62 02/20/2013   ALBUMIN 3.8 02/20/2013   ALKPHOS 121 (H) 02/20/2013                 Electrolytes Lab Results  Component Value Date   NA 135 (L) 02/20/2013   K 3.9 02/20/2013   CL 105 02/20/2013   CALCIUM 9.2 02/20/2013                 Neuropathy Markers No results found for: [id number]               Bone Pathology Markers Lab Results  Component Value Date   ALKPHOS 121 (H) 02/20/2013   CALCIUM 9.2 02/20/2013                 Coagulation Parameters Lab Results  Component Value Date   PLT 255  02/20/2013                 Cardiovascular Markers Lab Results  Component Value Date   HGB 16.9 02/20/2013   HCT 50.4 02/20/2013                 Note: Lab results reviewed.  Recent Diagnostic Imaging Results  DG C-Arm 1-60 Min-No Report Fluoroscopy was utilized by the requesting physician.  No radiographic  interpretation.   Complexity Note: Imaging results reviewed. Results shared with Mr. Riel, using Layman's terms.                         Meds   Current Outpatient Prescriptions:  .  Ascorbic Acid (VITAMIN C) 1000 MG tablet, Take 1,000 mg by mouth., Disp: , Rfl:  .  aspirin EC 81 MG tablet, daily. , Disp: , Rfl:  .  Cholecalciferol (VITAMIN D-1000 MAX ST) 1000 UNITS tablet, Take 1,000 Units by mouth daily. , Disp: , Rfl:  .  finasteride (PROSCAR) 5 MG tablet, TAKE 1 TABLET EVERY DAY, Disp: 90 tablet, Rfl: 2 .  FLUoxetine (PROZAC) 10 MG capsule, Take by mouth., Disp: , Rfl:  .  FLUoxetine (PROZAC) 20 MG capsule, Take 30 mg by mouth daily. , Disp: , Rfl:  .  metroNIDAZOLE (METROGEL) 0.75 % gel, Apply 1 application topically  daily. , Disp: , Rfl:  .  Multiple Vitamin (MULTI-VITAMINS) TABS, Take by mouth daily. , Disp: , Rfl:  .  NONFORMULARY OR COMPOUNDED ITEM, Apply 1-2 g topically 4 (four) times daily. (Patient taking differently: Apply 1-2 g topically as needed. ), Disp: 120 each, Rfl: 2 .  Omega-3 Fatty Acids (FISH OIL) 1000 MG CAPS, Take by mouth daily. , Disp: , Rfl:  .  prednisoLONE acetate (PRED FORTE) 1 % ophthalmic suspension, Place 1 drop into the right eye daily., Disp: , Rfl:  .  predniSONE (DELTASONE) 10 MG tablet, Take 10 mg by mouth daily with breakfast. , Disp: , Rfl:  .  SHINGRIX injection, , Disp: , Rfl:  .  tamsulosin (FLOMAX) 0.4 MG CAPS capsule, Take 0.4 mg by mouth daily. , Disp: , Rfl:  .  venlafaxine XR (EFFEXOR-XR) 150 MG 24 hr capsule, Take 150 mg by mouth daily with breakfast. , Disp: , Rfl:  .  Coenzyme Q10 (COQ-10) 200 MG CAPS, Take 200 mg by mouth., Disp: , Rfl:  .  Dapsone (ACZONE) 5 % topical gel, , Disp: , Rfl:  .  finasteride (PROSCAR) 5 MG tablet, TAKE 1 TABLET EVERY DAY (Patient not taking: Reported on 11/03/2016), Disp: 90 tablet, Rfl: 2 .  finasteride (PROSCAR) 5 MG tablet, TAKE 1 TABLET EVERY DAY (Patient not taking: Reported on 11/03/2016), Disp: 90 tablet, Rfl: 2 .  FLUoxetine (PROZAC) 10 MG capsule, , Disp: , Rfl:  .  metroNIDAZOLE (METROGEL) 1 % gel, Apply topically., Disp: , Rfl:   Current Facility-Administered Medications:  .  betamethasone acetate-betamethasone sodium phosphate (CELESTONE) injection 12 mg, 12 mg, Intramuscular, Once, Evans, Brent M, DPM .  betamethasone acetate-betamethasone sodium phosphate (CELESTONE) injection 3 mg, 3 mg, Intramuscular, Once, Evans, Dorathy Daft, DPM  ROS  Constitutional: Denies any fever or chills Gastrointestinal: No reported hemesis, hematochezia, vomiting, or acute GI distress Musculoskeletal: Denies any acute onset joint swelling, redness, loss of ROM, or weakness Neurological: No reported episodes of acute onset apraxia, aphasia,  dysarthria, agnosia, amnesia, paralysis, loss of coordination, or loss of consciousness  Allergies  Mr. Meggett is  allergic to ciprofloxacin; clarithromycin; oxycodone; penicillins; and sulfur.  [place]  Drug: Mr. Delisle  reports that he does not use drugs. Alcohol:  reports that he does not drink alcohol. Tobacco:  reports that he has quit smoking. He has never used smokeless tobacco. Medical:  has a past medical history of Allergy; Anxiety (06/17/2014); Benign prostatic hyperplasia with urinary obstruction (06/17/2014); Benign prostatic hypertrophy without urinary obstruction (06/20/2013); Bladder calculi (04/26/2013); Calculi, ureter (03/29/2013); Calculus of kidney (02/25/2013); Cataract; Clinical depression (06/17/2014); Degenerative disc disease, lumbar; Frank hematuria (06/28/2013); HLD (hyperlipidemia) (06/17/2014); and Spondylolysis. Surgical: Mr. Salay  has a past surgical history that includes Ileostomy; Lithotripsy (03/2013); and Hernia repair (1993). Family: family history includes Diabetes in his mother.  Constitutional Exam  General appearance: Well nourished, well developed, and well hydrated. In no apparent acute distress Vitals:   11/03/16 1211  BP: (!) 148/66  Pulse: (!) 57  Resp: 16  Temp: 97.9 F (36.6 C)  TempSrc: Oral  SpO2: 96%  Weight: 165 lb (74.8 kg)  Height: 6' (1.829 m)   BMI Assessment: Estimated body mass index is 22.38 kg/m as calculated from the following:   Height as of this encounter: 6' (1.829 m).   Weight as of this encounter: 165 lb (74.8 kg).  BMI interpretation table: BMI level Category Range association with higher incidence of chronic pain  <18 kg/m2 Underweight   18.5-24.9 kg/m2 Ideal body weight   25-29.9 kg/m2 Overweight Increased incidence by 20%  30-34.9 kg/m2 Obese (Class I) Increased incidence by 68%  35-39.9 kg/m2 Severe obesity (Class II) Increased incidence by 136%  >40 kg/m2 Extreme obesity (Class III) Increased incidence by 254%   BMI Readings  from Last 4 Encounters:  11/03/16 22.38 kg/m  10/18/16 22.38 kg/m  10/13/16 23.01 kg/m  10/04/16 23.01 kg/m   Wt Readings from Last 4 Encounters:  11/03/16 165 lb (74.8 kg)  10/18/16 165 lb (74.8 kg)  10/13/16 165 lb (74.8 kg)  10/04/16 165 lb (74.8 kg)  Psych/Mental status: Alert, oriented x 3 (person, place, & time)       Eyes: PERLA Respiratory: No evidence of acute respiratory distress  Cervical Spine Area Exam  Skin & Axial Inspection: No masses, redness, edema, swelling, or associated skin lesions Alignment: Symmetrical Functional ROM: Unrestricted ROM      Stability: No instability detected Muscle Tone/Strength: Functionally intact. No obvious neuro-muscular anomalies detected. Sensory (Neurological): Unimpaired Palpation: No palpable anomalies              Upper Extremity (UE) Exam    Side: Right upper extremity  Side: Left upper extremity  Skin & Extremity Inspection: Skin color, temperature, and hair growth are WNL. No peripheral edema or cyanosis. No masses, redness, swelling, asymmetry, or associated skin lesions. No contractures.  Skin & Extremity Inspection: Skin color, temperature, and hair growth are WNL. No peripheral edema or cyanosis. No masses, redness, swelling, asymmetry, or associated skin lesions. No contractures.  Functional ROM: Unrestricted ROM          Functional ROM: Unrestricted ROM          Muscle Tone/Strength: Functionally intact. No obvious neuro-muscular anomalies detected.  Muscle Tone/Strength: Functionally intact. No obvious neuro-muscular anomalies detected.  Sensory (Neurological): Unimpaired          Sensory (Neurological): Unimpaired          Palpation: No palpable anomalies              Palpation: No palpable anomalies  Specialized Test(s): Deferred         Specialized Test(s): Deferred          Thoracic Spine Area Exam  Skin & Axial Inspection: No masses, redness, or swelling Alignment: Symmetrical Functional ROM:  Unrestricted ROM Stability: No instability detected Muscle Tone/Strength: Functionally intact. No obvious neuro-muscular anomalies detected. Sensory (Neurological): Unimpaired Muscle strength & Tone: No palpable anomalies  Lumbar Spine Area Exam  Skin & Axial Inspection: No masses, redness, or swelling Alignment: Symmetrical Functional ROM: Unrestricted ROM      Stability: No instability detected Muscle Tone/Strength: Functionally intact. No obvious neuro-muscular anomalies detected. Sensory (Neurological): Unimpaired Palpation: No palpable anomalies       Provocative Tests: Lumbar Hyperextension and rotation test: Positive bilaterally for facet joint pain. Lumbar Lateral bending test: Positive due to pain. Patrick's Maneuver: evaluation deferred today                    Gait & Posture Assessment  Ambulation: Limited Gait: Antalgic Posture: WNL   Lower Extremity Exam    Side: Right lower extremity  Side: Left lower extremity  Skin & Extremity Inspection: Skin color, temperature, and hair growth are WNL. No peripheral edema or cyanosis. No masses, redness, swelling, asymmetry, or associated skin lesions. No contractures.  Skin & Extremity Inspection: Skin color, temperature, and hair growth are WNL. No peripheral edema or cyanosis. No masses, redness, swelling, asymmetry, or associated skin lesions. No contractures.  Functional ROM: Unrestricted ROM          Functional ROM: Unrestricted ROM          Muscle Tone/Strength: Functionally intact. No obvious neuro-muscular anomalies detected.  Muscle Tone/Strength: Functionally intact. No obvious neuro-muscular anomalies detected.  Sensory (Neurological): Unimpaired  Sensory (Neurological): Unimpaired  Palpation: No palpable anomalies  Palpation: No palpable anomalies   Assessment  Primary Diagnosis & Pertinent Problem List: The primary encounter diagnosis was Lumbar spondylosis. Diagnoses of Spondylosis without myelopathy or  radiculopathy, lumbar region, Lumbar degenerative disc disease, and Facet arthropathy were also pertinent to this visit.  Status Diagnosis  Responding Responding Responding 1. Lumbar spondylosis   2. Spondylosis without myelopathy or radiculopathy, lumbar region   3. Lumbar degenerative disc disease   4. Facet arthropathy      83 year old male with a past medical history of arthritis, hypertension, hyperlipidemia, lumbar degenerative disc disease who presents with axial low back pain with radiation to his posterior thighs, buttocks down to his knees. Patient's pain does not radiate below the knees. Patient denies any numbness tingling or burning. He describes it as more of an achy throb in his back that extends to his posterior thighs. Straight leg raise test was negative bilaterally. Pain with lumbar extension and facet loading and lateral rotation which is very similar to the chronic pain that he experiences. Patient states that taking extra strength Tylenol and ibuprofen helps with his pain. Patient's lumbar MRI shows severe spinal stenosis at L4-L5 secondary to facet arthropathy and hypertrophy and anterolisthesis of L4 on L5. There is also moderate right foraminal narrowing. Patient also has L5-S1 right foraminal impingement due to endplate spur. He has multilevel degenerative disc disease present most pronounced in the L3-L5 region.  Patient is status post bilateral L3-L4, L4-L5, L5-S1 facet diagnostic blocks on 10/04/2016 and 10/18/16. Patient states that the blocks provided him with approximately 90% pain relief on postprocedure day 0, approximately 80% pain relief on postprocedure stay 1 and 2. He states that after the pain has returned  but not to the same extent as before. Patient continues to have pain with lumbar extension however it is improved from last exam. This would suggest an effective diagnostic intervention. Plan to proceed with radiofrequency ablation starting with the left side  for longer lasting pain relief.  Plan: -Start with left L3-L4, L4-L5, L5-S1 facet radiofrequency ablation followed by right.  Provider-requested follow-up: Return in about 5 days (around 11/08/2016) for Procedure.  Future Appointments Date Time Provider [place]  11/08/2016 8:00 AM Gillis Santa, MD [facility] None  04/01/2017 9:00 AM Hollice Espy, MD [facility] None    Primary Care Physician: [name], MD Location: [facility] Note by: Gillis Santa, M.D Date: 11/03/2016; Time: 1:44 PM  Patient Instructions   Radiofrequency Lesioning Radiofrequency lesioning is a procedure that is performed to relieve pain. The procedure is often used for back, neck, or arm pain. Radiofrequency lesioning involves the use of a machine that creates radio waves to make heat. During the procedure, the heat is applied to the nerve that carries the pain signal. The heat damages the nerve and interferes with the pain signal. Pain relief usually starts about 2 weeks after the procedure and lasts for 6 months to 1 year. Tell a health care provider about:  Any allergies you have.  All medicines you are taking, including vitamins, herbs, eye drops, creams, and over-the-counter medicines.  Any problems you or family members have had with anesthetic medicines.  Any blood disorders you have.  Any surgeries you have had.  Any medical conditions you have.  Whether you are pregnant or may be pregnant. What are the risks? Generally, this is a safe procedure. However, problems may occur, including:  Pain or soreness at the injection site.  Infection at the injection site.  Damage to nerves or blood vessels.  What happens before the procedure?  Ask your health care provider about: ? Changing or stopping your regular medicines. This is especially important if you are taking diabetes medicines or blood thinners. ? Taking medicines such as aspirin and ibuprofen.  These medicines can thin your blood. Do not take these medicines before your procedure if your health care provider instructs you not to.  Follow instructions from your health care provider about eating or drinking restrictions.  Plan to have someone take you home after the procedure.  If you go home right after the procedure, plan to have someone with you for 24 hours. What happens during the procedure?  You will be given one or more of the following: ? A medicine to help you relax (sedative). ? A medicine to numb the area (local anesthetic).  You will be awake during the procedure. You will need to be able to talk with the health care provider during the procedure.  With the help of a type of X-ray (fluoroscopy), the health care provider will insert a radiofrequency needle into the area to be treated.  Next, a wire that carries the radio waves (electrode) will be put through the radiofrequency needle. An electrical pulse will be sent through the electrode to verify the correct nerve. You will feel a tingling sensation, and you may have muscle twitching.  Then, the tissue that is around the needle tip will be heated by an electric current that is passed using the radiofrequency machine. This will numb the nerves.  A bandage (dressing) will be put on the insertion area after the procedure is done. The procedure may vary among health care providers and hospitals.  What happens after the procedure?  Your blood pressure, heart rate, breathing rate, and blood oxygen level will be monitored often until the medicines you were given have worn off.  Return to your normal activities as directed by your health care provider. This information is not intended to replace advice given to you by your health care provider. Make sure you discuss any questions you have with your health care provider. Document Released: 09/09/2010 Document Revised: 06/19/2015 Document Reviewed: 02/18/2014 Elsevier  Interactive Patient Education  2018 [employer]  What are the risk, side effects and possible complications? Generally speaking, most procedures are safe.  However, with any procedure there are risks, side effects, and the possibility of complications.  The risks and complications are dependent upon the sites that are lesioned, or the type of nerve block to be performed.  The closer the procedure is to the spine, the more serious the risks are.  Great care is taken when placing the radio frequency needles, block needles or lesioning probes, but sometimes complications can occur. 1. Infection: Any time there is an injection through the skin, there is a risk of infection.  This is why sterile conditions are used for these blocks.  There are four possible types of infection. 1. Localized skin infection. 2. Central Nervous System Infection-This can be in the form of Meningitis, which can be deadly. 3. Epidural Infections-This can be in the form of an epidural abscess, which can cause pressure inside of the spine, causing compression of the spinal cord with subsequent paralysis. This would require an emergency surgery to decompress, and there are no guarantees that the patient would recover from the paralysis. 4. Discitis-This is an infection of the intervertebral discs.  It occurs in about 1% of discography procedures.  It is difficult to treat and it may lead to surgery.        2. Pain: the needles have to go through skin and soft tissues, will cause soreness.       3. Damage to internal structures:  The nerves to be lesioned may be near blood vessels or    other nerves which can be potentially damaged.       4. Bleeding: Bleeding is more common if the patient is taking blood thinners such as  aspirin, Coumadin, Ticiid, Plavix, etc., or if he/she have some genetic predisposition  such as hemophilia. Bleeding into the spinal canal can cause compression of the spinal  cord  with subsequent paralysis.  This would require an emergency surgery to  decompress and there are no guarantees that the patient would recover from the  paralysis.       5. Pneumothorax:  Puncturing of a lung is a possibility, every time a needle is introduced in  the area of the chest or upper back.  Pneumothorax refers to free air around the  collapsed lung(s), inside of the thoracic cavity (chest cavity).  Another two possible  complications related to a similar event would include: Hemothorax and Chylothorax.   These are variations of the Pneumothorax, where instead of air around the collapsed  lung(s), you may have blood or chyle, respectively.       6. Spinal headaches: They may occur with any procedures in the area of the spine.       7. Persistent CSF (Cerebro-Spinal Fluid) leakage: This is a rare problem, but may occur  with prolonged intrathecal or epidural catheters either due to the formation of a fistulous  track or a dural tear.       8. Nerve damage: By working so close to the spinal cord, there is always a possibility of  nerve damage, which could be as serious as a permanent spinal cord injury with  paralysis.       9. Death:  Although rare, severe deadly allergic reactions known as "Anaphylactic  reaction" can occur to any of the medications used.      10. Worsening of the symptoms:  We can always make thing worse.  What are the chances of something like this happening? Chances of any of this occuring are extremely low.  By statistics, you have more of a chance of getting killed in a motor vehicle accident: while driving to the hospital than any of the above occurring .  Nevertheless, you should be aware that they are possibilities.  In general, it is similar to taking a shower.  Everybody knows that you can slip, hit your head and get killed.  Does that mean that you should not shower again?  Nevertheless always keep in mind that statistics do not mean anything if you happen to be on the  wrong side of them.  Even if a procedure has a 1 (one) in a 1,000,000 (million) chance of going wrong, it you happen to be that one..Also, keep in mind that by statistics, you have more of a chance of having something go wrong when taking medications.  Who should not have this procedure? If you are on a blood thinning medication (e.g. Coumadin, Plavix, see list of "Blood Thinners"), or if you have an active infection going on, you should not have the procedure.  If you are taking any blood thinners, please inform your physician.  How should I prepare for this procedure?  Do not eat or drink anything at least six hours prior to the procedure.  Bring a driver with you .  It cannot be a taxi.  Come accompanied by an adult that can drive you back, and that is strong enough to help you if your legs get weak or numb from the local anesthetic.  Take all of your medicines the morning of the procedure with just enough water to swallow them.  If you have diabetes, make sure that you are scheduled to have your procedure done first thing in the morning, whenever possible.  If you have diabetes, take only half of your insulin dose and notify our nurse that you have done so as soon as you arrive at the clinic.  If you are diabetic, but only take blood sugar pills (oral hypoglycemic), then do not take them on the morning of your procedure.  You may take them after you have had the procedure.  Do not take aspirin or any aspirin-containing medications, at least eleven (11) days prior to the procedure.  They may prolong bleeding.  Wear loose fitting clothing that may be easy to take off and that you would not mind if it got stained with Betadine or blood.  Do not wear any jewelry or perfume  Remove any nail coloring.  It will interfere with some of our monitoring equipment.  NOTE: Remember that this is not meant to be interpreted as a complete list of all possible complications.  Unforeseen problems may  occur.  BLOOD THINNERS The following drugs contain aspirin or other products, which can cause increased bleeding during surgery and should not be taken for 2 weeks prior to and 1 week after surgery.  If you should need take something for relief of minor pain, you may take acetaminophen which is found in Tylenol,m Datril, Anacin-3 and Panadol. It is not blood thinner. The products listed below are.  Do not take any of the products listed below in addition to any listed on your instruction sheet.  A.P.C or A.P.C with Codeine Codeine Phosphate Capsules #3 Ibuprofen Ridaura  ABC compound Congesprin Imuran rimadil  Advil Cope Indocin Robaxisal  Alka-Seltzer Effervescent Pain Reliever and Antacid Coricidin or Coricidin-D  Indomethacin Rufen  Alka-Seltzer plus Cold Medicine Cosprin Ketoprofen S-A-C Tablets  Anacin Analgesic Tablets or Capsules Coumadin Korlgesic Salflex  Anacin Extra Strength Analgesic tablets or capsules CP-2 Tablets Lanoril Salicylate  Anaprox Cuprimine Capsules Levenox Salocol  Anexsia-D Dalteparin Magan Salsalate  Anodynos Darvon compound Magnesium Salicylate Sine-off  Ansaid Dasin Capsules Magsal Sodium Salicylate  Anturane Depen Capsules Marnal Soma  APF Arthritis pain formula Dewitt's Pills Measurin Stanback  Argesic Dia-Gesic Meclofenamic Sulfinpyrazone  Arthritis Bayer Timed Release Aspirin Diclofenac Meclomen Sulindac  Arthritis pain formula Anacin Dicumarol Medipren Supac  Analgesic (Safety coated) Arthralgen Diffunasal Mefanamic Suprofen  Arthritis Strength Bufferin Dihydrocodeine Mepro Compound Suprol  Arthropan liquid Dopirydamole Methcarbomol with Aspirin Synalgos  ASA tablets/Enseals Disalcid Micrainin Tagament  Ascriptin Doan's Midol Talwin  Ascriptin A/D Dolene Mobidin Tanderil  Ascriptin Extra Strength Dolobid Moblgesic Ticlid  Ascriptin with Codeine Doloprin or Doloprin with Codeine Momentum Tolectin  Asperbuf Duoprin Mono-gesic Trendar  Aspergum Duradyne  Motrin or Motrin IB Triminicin  Aspirin plain, buffered or enteric coated Durasal Myochrisine Trigesic  Aspirin Suppositories Easprin Nalfon Trillsate  Aspirin with Codeine Ecotrin Regular or Extra Strength Naprosyn Uracel  Atromid-S Efficin Naproxen Ursinus  Auranofin Capsules Elmiron Neocylate Vanquish  Axotal Emagrin Norgesic Verin  Azathioprine Empirin or Empirin with Codeine Normiflo Vitamin E  Azolid Emprazil Nuprin Voltaren  Bayer Aspirin plain, buffered or children's or timed BC Tablets or powders Encaprin Orgaran Warfarin Sodium  Buff-a-Comp Enoxaparin Orudis Zorpin  Buff-a-Comp with Codeine Equegesic Os-Cal-Gesic   Buffaprin Excedrin plain, buffered or Extra Strength Oxalid   Bufferin Arthritis Strength Feldene Oxphenbutazone   Bufferin plain or Extra Strength Feldene Capsules Oxycodone with Aspirin   Bufferin with Codeine Fenoprofen Fenoprofen Pabalate or Pabalate-SF   Buffets II Flogesic Panagesic   Buffinol plain or Extra Strength Florinal or Florinal with Codeine Panwarfarin   Buf-Tabs Flurbiprofen Penicillamine   Butalbital Compound Four-way cold tablets Penicillin   Butazolidin Fragmin Pepto-Bismol   Carbenicillin Geminisyn Percodan   Carna Arthritis Reliever Geopen Persantine   Carprofen Gold's salt Persistin   Chloramphenicol Goody's Phenylbutazone   Chloromycetin Haltrain Piroxlcam   Clmetidine heparin Plaquenil   Cllnoril Hyco-pap Ponstel   Clofibrate Hydroxy chloroquine Propoxyphen         Before stopping any of these medications, be sure to consult the physician who ordered them.  Some, such as Coumadin (Warfarin) are ordered to prevent or treat serious conditions such as "deep thrombosis", "pumonary embolisms", and other heart problems.  The amount of time that you may need off of the medication may also vary with the medication and the reason for which you were taking it.  If you are taking any of these medications, please make sure you notify your pain  physician before you undergo any procedures.

## 2016-11-08 ENCOUNTER — Ambulatory Visit: Payer: PPO | Admitting: Student in an Organized Health Care Education/Training Program

## 2016-11-15 ENCOUNTER — Ambulatory Visit
Admission: RE | Admit: 2016-11-15 | Discharge: 2016-11-15 | Disposition: A | Discharge status: Home / Self Care | Payer: PPO | Source: Ambulatory Visit | Attending: Student in an Organized Health Care Education/Training Program | Admitting: Student in an Organized Health Care Education/Training Program

## 2016-11-15 ENCOUNTER — Ambulatory Visit (HOSPITAL_BASED_OUTPATIENT_CLINIC_OR_DEPARTMENT_OTHER): Payer: PPO | Admitting: Student in an Organized Health Care Education/Training Program

## 2016-11-15 ENCOUNTER — Encounter: Payer: Self-pay | Admitting: Student in an Organized Health Care Education/Training Program

## 2016-11-15 VITALS — BP 161/71 | HR 60 | Temp 97.6°F | Resp 20 | Ht 72.0 in | Wt 165.0 lb

## 2016-11-15 DIAGNOSIS — M47816 Spondylosis without myelopathy or radiculopathy, lumbar region: Secondary | ICD-10-CM | POA: Diagnosis not present

## 2016-11-15 DIAGNOSIS — Z880 Allergy status to penicillin: Secondary | ICD-10-CM | POA: Insufficient documentation

## 2016-11-15 DIAGNOSIS — M5136 Other intervertebral disc degeneration, lumbar region: Secondary | ICD-10-CM | POA: Insufficient documentation

## 2016-11-15 DIAGNOSIS — Z881 Allergy status to other antibiotic agents status: Secondary | ICD-10-CM | POA: Diagnosis not present

## 2016-11-15 DIAGNOSIS — Z888 Allergy status to other drugs, medicaments and biological substances status: Secondary | ICD-10-CM | POA: Diagnosis not present

## 2016-11-15 MED ORDER — LACTATED RINGERS IV SOLN
1000.0000 mL | Freq: Once | INTRAVENOUS | Status: AC
  Administered 2016-11-15: 1000 mL via INTRAVENOUS

## 2016-11-15 MED ORDER — FENTANYL CITRATE (PF) 100 MCG/2ML IJ SOLN
25.0000 ug | INTRAMUSCULAR | Status: DC | PRN
  Administered 2016-11-15: 25 ug via INTRAVENOUS
  Filled 2016-11-15: qty 2

## 2016-11-15 MED ORDER — ROPIVACAINE HCL 2 MG/ML IJ SOLN
10.0000 mL | Freq: Once | INTRAMUSCULAR | Status: AC
  Administered 2016-11-15: 10 mL
  Filled 2016-11-15: qty 10

## 2016-11-15 MED ORDER — DEXAMETHASONE SODIUM PHOSPHATE 10 MG/ML IJ SOLN
INTRAMUSCULAR | Status: AC
  Filled 2016-11-15: qty 1

## 2016-11-15 MED ORDER — DEXAMETHASONE SODIUM PHOSPHATE 4 MG/ML IJ SOLN
10.0000 mg | Freq: Once | INTRAMUSCULAR | Status: AC
  Administered 2016-11-15: 10 mg
  Filled 2016-11-15: qty 2.5

## 2016-11-15 MED ORDER — LIDOCAINE HCL (PF) 1 % IJ SOLN
INTRAMUSCULAR | Status: AC
  Filled 2016-11-15: qty 5

## 2016-11-15 MED ORDER — LIDOCAINE HCL (PF) 1 % IJ SOLN
10.0000 mL | Freq: Once | INTRAMUSCULAR | Status: AC
  Administered 2016-11-15: 10 mL
  Filled 2016-11-15: qty 10

## 2016-11-15 NOTE — Progress Notes (Signed)
Safety precautions to be maintained throughout the outpatient stay will include: orient to surroundings, keep bed in low position, maintain call bell within reach at all times, provide assistance with transfer out of bed and ambulation.  

## 2016-11-15 NOTE — Patient Instructions (Addendum)
-Return for Right sided RFAGENERAL RISKS AND COMPLICATIONS  What are the risk, side effects and possible complications? Generally speaking, most procedures are safe.  However, with any procedure there are risks, side effects, and the possibility of complications.  The risks and complications are dependent upon the sites that are lesioned, or the type of nerve block to be performed.  The closer the procedure is to the spine, the more serious the risks are.  Great care is taken when placing the radio frequency needles, block needles or lesioning probes, but sometimes complications can occur. 1. Infection: Any time there is an injection through the skin, there is a risk of infection.  This is why sterile conditions are used for these blocks.  There are four possible types of infection. 1. Localized skin infection. 2. Central Nervous System Infection-This can be in the form of Meningitis, which can be deadly. 3. Epidural Infections-This can be in the form of an epidural abscess, which can cause pressure inside of the spine, causing compression of the spinal cord with subsequent paralysis. This would require an emergency surgery to decompress, and there are no guarantees that the patient would recover from the paralysis. 4. Discitis-This is an infection of the intervertebral discs.  It occurs in about 1% of discography procedures.  It is difficult to treat and it may lead to surgery.        2. Pain: the needles have to go through skin and soft tissues, will cause soreness.       3. Damage to internal structures:  The nerves to be lesioned may be near blood vessels or    other nerves which can be potentially damaged.       4. Bleeding: Bleeding is more common if the patient is taking blood thinners such as  aspirin, Coumadin, Ticiid, Plavix, etc., or if he/she have some genetic predisposition  such as hemophilia. Bleeding into the spinal canal can cause compression of the spinal  cord with subsequent  paralysis.  This would require an emergency surgery to  decompress and there are no guarantees that the patient would recover from the  paralysis.       5. Pneumothorax:  Puncturing of a lung is a possibility, every time a needle is introduced in  the area of the chest or upper back.  Pneumothorax refers to free air around the  collapsed lung(s), inside of the thoracic cavity (chest cavity).  Another two possible  complications related to a similar event would include: Hemothorax and Chylothorax.   These are variations of the Pneumothorax, where instead of air around the collapsed  lung(s), you may have blood or chyle, respectively.       6. Spinal headaches: They may occur with any procedures in the area of the spine.       7. Persistent CSF (Cerebro-Spinal Fluid) leakage: This is a rare problem, but may occur  with prolonged intrathecal or epidural catheters either due to the formation of a fistulous  track or a dural tear.       8. Nerve damage: By working so close to the spinal cord, there is always a possibility of  nerve damage, which could be as serious as a permanent spinal cord injury with  paralysis.       9. Death:  Although rare, severe deadly allergic reactions known as "Anaphylactic  reaction" can occur to any of the medications used.      10. Worsening of the symptoms:  We can  always make thing worse.  What are the chances of something like this happening? Chances of any of this occuring are extremely low.  By statistics, you have more of a chance of getting killed in a motor vehicle accident: while driving to the hospital than any of the above occurring .  Nevertheless, you should be aware that they are possibilities.  In general, it is similar to taking a shower.  Everybody knows that you can slip, hit your head and get killed.  Does that mean that you should not shower again?  Nevertheless always keep in mind that statistics do not mean anything if you happen to be on the wrong side of  them.  Even if a procedure has a 1 (one) in a 1,000,000 (million) chance of going wrong, it you happen to be that one..Also, keep in mind that by statistics, you have more of a chance of having something go wrong when taking medications.  Who should not have this procedure? If you are on a blood thinning medication (e.g. Coumadin, Plavix, see list of "Blood Thinners"), or if you have an active infection going on, you should not have the procedure.  If you are taking any blood thinners, please inform your physician.  How should I prepare for this procedure?  Do not eat or drink anything at least six hours prior to the procedure.  Bring a driver with you .  It cannot be a taxi.  Come accompanied by an adult that can drive you back, and that is strong enough to help you if your legs get weak or numb from the local anesthetic.  Take all of your medicines the morning of the procedure with just enough water to swallow them.  If you have diabetes, make sure that you are scheduled to have your procedure done first thing in the morning, whenever possible.  If you have diabetes, take only half of your insulin dose and notify our nurse that you have done so as soon as you arrive at the clinic.  If you are diabetic, but only take blood sugar pills (oral hypoglycemic), then do not take them on the morning of your procedure.  You may take them after you have had the procedure.  Do not take aspirin or any aspirin-containing medications, at least eleven (11) days prior to the procedure.  They may prolong bleeding.  Wear loose fitting clothing that may be easy to take off and that you would not mind if it got stained with Betadine or blood.  Do not wear any jewelry or perfume  Remove any nail coloring.  It will interfere with some of our monitoring equipment.  NOTE: Remember that this is not meant to be interpreted as a complete list of all possible complications.  Unforeseen problems may occur.  BLOOD  THINNERS The following drugs contain aspirin or other products, which can cause increased bleeding during surgery and should not be taken for 2 weeks prior to and 1 week after surgery.  If you should need take something for relief of minor pain, you may take acetaminophen which is found in Tylenol,m Datril, Anacin-3 and Panadol. It is not blood thinner. The products listed below are.  Do not take any of the products listed below in addition to any listed on your instruction sheet.  A.P.C or A.P.C with Codeine Codeine Phosphate Capsules #3 Ibuprofen Ridaura  ABC compound Congesprin Imuran rimadil  Advil Cope Indocin Robaxisal  Alka-Seltzer Effervescent Pain Reliever and Antacid Coricidin or  Coricidin-D  Indomethacin Rufen  Alka-Seltzer plus Cold Medicine Cosprin Ketoprofen S-A-C Tablets  Anacin Analgesic Tablets or Capsules Coumadin Korlgesic Salflex  Anacin Extra Strength Analgesic tablets or capsules CP-2 Tablets Lanoril Salicylate  Anaprox Cuprimine Capsules Levenox Salocol  Anexsia-D Dalteparin Magan Salsalate  Anodynos Darvon compound Magnesium Salicylate Sine-off  Ansaid Dasin Capsules Magsal Sodium Salicylate  Anturane Depen Capsules Marnal Soma  APF Arthritis pain formula Dewitt's Pills Measurin Stanback  Argesic Dia-Gesic Meclofenamic Sulfinpyrazone  Arthritis Bayer Timed Release Aspirin Diclofenac Meclomen Sulindac  Arthritis pain formula Anacin Dicumarol Medipren Supac  Analgesic (Safety coated) Arthralgen Diffunasal Mefanamic Suprofen  Arthritis Strength Bufferin Dihydrocodeine Mepro Compound Suprol  Arthropan liquid Dopirydamole Methcarbomol with Aspirin Synalgos  ASA tablets/Enseals Disalcid Micrainin Tagament  Ascriptin Doan's Midol Talwin  Ascriptin A/D Dolene Mobidin Tanderil  Ascriptin Extra Strength Dolobid Moblgesic Ticlid  Ascriptin with Codeine Doloprin or Doloprin with Codeine Momentum Tolectin  Asperbuf Duoprin Mono-gesic Trendar  Aspergum Duradyne Motrin or Motrin  IB Triminicin  Aspirin plain, buffered or enteric coated Durasal Myochrisine Trigesic  Aspirin Suppositories Easprin Nalfon Trillsate  Aspirin with Codeine Ecotrin Regular or Extra Strength Naprosyn Uracel  Atromid-S Efficin Naproxen Ursinus  Auranofin Capsules Elmiron Neocylate Vanquish  Axotal Emagrin Norgesic Verin  Azathioprine Empirin or Empirin with Codeine Normiflo Vitamin E  Azolid Emprazil Nuprin Voltaren  Bayer Aspirin plain, buffered or children's or timed BC Tablets or powders Encaprin Orgaran Warfarin Sodium  Buff-a-Comp Enoxaparin Orudis Zorpin  Buff-a-Comp with Codeine Equegesic Os-Cal-Gesic   Buffaprin Excedrin plain, buffered or Extra Strength Oxalid   Bufferin Arthritis Strength Feldene Oxphenbutazone   Bufferin plain or Extra Strength Feldene Capsules Oxycodone with Aspirin   Bufferin with Codeine Fenoprofen Fenoprofen Pabalate or Pabalate-SF   Buffets II Flogesic Panagesic   Buffinol plain or Extra Strength Florinal or Florinal with Codeine Panwarfarin   Buf-Tabs Flurbiprofen Penicillamine   Butalbital Compound Four-way cold tablets Penicillin   Butazolidin Fragmin Pepto-Bismol   Carbenicillin Geminisyn Percodan   Carna Arthritis Reliever Geopen Persantine   Carprofen Gold's salt Persistin   Chloramphenicol Goody's Phenylbutazone   Chloromycetin Haltrain Piroxlcam   Clmetidine heparin Plaquenil   Cllnoril Hyco-pap Ponstel   Clofibrate Hydroxy chloroquine Propoxyphen         Before stopping any of these medications, be sure to consult the physician who ordered them.  Some, such as Coumadin (Warfarin) are ordered to prevent or treat serious conditions such as "deep thrombosis", "pumonary embolisms", and other heart problems.  The amount of time that you may need off of the medication may also vary with the medication and the reason for which you were taking it.  If you are taking any of these medications, please make sure you notify your pain physician before you  undergo any procedures.         Pain Management Discharge Instructions  General Discharge Instructions :  If you need to reach your doctor call: Monday-Friday 8:00 am - 4:00 pm at [phone] or toll free [phone].  After clinic hours [phone] to have operator reach doctor.  Bring all of your medication bottles to all your appointments in the pain clinic.  To cancel or reschedule your appointment with Pain Management please remember to call 24 hours in advance to avoid a fee.  Refer to the educational materials which you have been given on: General Risks, I had my Procedure. Discharge Instructions, Post Sedation.  Post Procedure Instructions:  The drugs you were given will stay in your system until tomorrow, so for the  next 24 hours you should not drive, make any legal decisions or drink any alcoholic beverages.  You may eat anything you prefer, but it is better to start with liquids then soups and crackers, and gradually work up to solid foods.  Please notify your doctor immediately if you have any unusual bleeding, trouble breathing or pain that is not related to your normal pain.  Depending on the type of procedure that was done, some parts of your body may feel week and/or numb.  This usually clears up by tonight or the next day.  Walk with the use of an assistive device or accompanied by an adult for the 24 hours.  You may use ice on the affected area for the first 24 hours.  Put ice in a Ziploc bag and cover with a towel and place against area 15 minutes on 15 minutes off.  You may switch to heat after 24 hours.Radiofrequency Lesioning, Care After Refer to this sheet in the next few weeks. These instructions provide you with information about caring for yourself after your procedure. Your health care provider may also give you more specific instructions. Your treatment has been planned according to current medical practices, but problems sometimes occur. Call your  health care provider if you have any problems or questions after your procedure. What can I expect after the procedure? After the procedure, it is common to have:  Pain from the burned nerve.  Temporary numbness.  Follow these instructions at home:  Take over-the-counter and prescription medicines only as told by your health care provider.  Return to your normal activities as told by your health care provider. Ask your health care provider what activities are safe for you.  Pay close attention to how you feel after the procedure. If you start to have pain, write down when it hurts and how it feels. This will help you and your health care provider to know if you need an additional treatment.  Check your needle insertion site every day for signs of infection. Watch for: ? Redness, swelling, or pain. ? Fluid, blood, or pus.  Keep all follow-up visits as told by your health care provider. This is important. Contact a health care provider if:  Your pain does not get better.  You have redness, swelling, or pain at the needle insertion site.  You have fluid, blood, or pus coming from the needle insertion site.  You have a fever. Get help right away if:  You develop sudden, severe pain.  You develop numbness or tingling near the procedure site that does not go away. This information is not intended to replace advice given to you by your health care provider. Make sure you discuss any questions you have with your health care provider. Document Released: 09/10/2010 Document Revised: 06/19/2015 Document Reviewed: 02/18/2014 Elsevier Interactive Patient Education  2018 [place]. Radiofrequency Lesioning Radiofrequency lesioning is a procedure that is performed to relieve pain. The procedure is often used for back, neck, or arm pain. Radiofrequency lesioning involves the use of a machine that creates radio waves to make heat. During the procedure, the heat is applied to the nerve that  carries the pain signal. The heat damages the nerve and interferes with the pain signal. Pain relief usually starts about 2 weeks after the procedure and lasts for 6 months to 1 year. Tell a health care provider about:  Any allergies you have.  All medicines you are taking, including vitamins, herbs, eye drops, creams, and over-the-counter  medicines.  Any problems you or family members have had with anesthetic medicines.  Any blood disorders you have.  Any surgeries you have had.  Any medical conditions you have.  Whether you are pregnant or may be pregnant. What are the risks? Generally, this is a safe procedure. However, problems may occur, including:  Pain or soreness at the injection site.  Infection at the injection site.  Damage to nerves or blood vessels.  What happens before the procedure?  Ask your health care provider about: ? Changing or stopping your regular medicines. This is especially important if you are taking diabetes medicines or blood thinners. ? Taking medicines such as aspirin and ibuprofen. These medicines can thin your blood. Do not take these medicines before your procedure if your health care provider instructs you not to.  Follow instructions from your health care provider about eating or drinking restrictions.  Plan to have someone take you home after the procedure.  If you go home right after the procedure, plan to have someone with you for 24 hours. What happens during the procedure?  You will be given one or more of the following: ? A medicine to help you relax (sedative). ? A medicine to numb the area (local anesthetic).  You will be awake during the procedure. You will need to be able to talk with the health care provider during the procedure.  With the help of a type of X-ray (fluoroscopy), the health care provider will insert a radiofrequency needle into the area to be treated.  Next, a wire that carries the radio waves (electrode) will  be put through the radiofrequency needle. An electrical pulse will be sent through the electrode to verify the correct nerve. You will feel a tingling sensation, and you may have muscle twitching.  Then, the tissue that is around the needle tip will be heated by an electric current that is passed using the radiofrequency machine. This will numb the nerves.  A bandage (dressing) will be put on the insertion area after the procedure is done. The procedure may vary among health care providers and hospitals. What happens after the procedure?  Your blood pressure, heart rate, breathing rate, and blood oxygen level will be monitored often until the medicines you were given have worn off.  Return to your normal activities as directed by your health care provider. This information is not intended to replace advice given to you by your health care provider. Make sure you discuss any questions you have with your health care provider. Document Released: 09/09/2010 Document Revised: 06/19/2015 Document Reviewed: 02/18/2014 Elsevier Interactive Patient Education  [employer].

## 2016-11-15 NOTE — Progress Notes (Signed)
Patient's Name: Todd Bennett  MRN: [id number]  Referring Provider: Gillis Santa, MD  DOB: Apr 22, 1932  PCP: [name], MD  DOS: 11/15/2016  Note by: Gillis Santa, MD  Service setting: Ambulatory outpatient  Specialty: Interventional Pain Management  Patient type: Established  Location: [facility] (AMB) Pain Management Facility  Visit type: Interventional Procedure   Primary Reason for Visit: Interventional Pain Management Treatment. CC: Back Pain (low)  Procedure:  Anesthesia, Analgesia, Anxiolysis:  Type: Therapeutic Medial Branch Facet Radiofrequency Ablation Region: Lumbar Level:  L3, L4, L5,  Medial Branch Level(s) Laterality: Left-Sided  Type: Local Anesthesia with Moderate (Conscious) Sedation Local Anesthetic: Lidocaine 1% Route: Intravenous (IV) IV Access: Secured Sedation: Meaningful verbal contact was maintained at all times during the procedure  Indication(s): Analgesia and Anxiety   Indications: 1. Lumbar degenerative disc disease   2. Spondylosis without myelopathy or radiculopathy, lumbar region   3. Lumbar spondylosis    Mr. Locy has either failed to respond, was unable to tolerate, or simply did not get enough benefit from other more conservative therapies including, but not limited to: 1. Over-the-counter medications 2. Anti-inflammatory medications 3. Muscle relaxants 4. Membrane stabilizers 5. Opioids 6. Physical therapy 7. Modalities (Heat, ice, etc.) 8. Invasive techniques such as nerve blocks.  Mr. Potempa has attained more than 70% relief of the pain from a series of diagnostic injections conducted in separate occasions.  Pain Score: Pre-procedure: 7 /10 Post-procedure: 0-No pain/10  Pre-op Assessment:  Mr. Donahue is a 83 y.o. (year old), male patient, seen today for interventional treatment. He  has a past surgical history that includes Ileostomy; Lithotripsy (03/2013); and Hernia repair (1993). Mr. Kaeser has a current medication list which includes the  following prescription(s): vitamin c, aspirin ec, cholecalciferol, diazepam, finasteride, fluoxetine, metronidazole, multi-vitamins, NONFORMULARY OR COMPOUNDED ITEM, fish oil, prednisolone acetate, tamsulosin, venlafaxine xr, vitamin c, aspirin ec, cholecalciferol, coq-10, dapsone, finasteride, finasteride, finasteride, fluoxetine, fluoxetine, fluoxetine, fluoxetine, meloxicam, metronidazole, metronidazole, prednisone, and shingrix, and the following Facility-Administered Medications: betamethasone acetate-betamethasone sodium phosphate, betamethasone acetate-betamethasone sodium phosphate, fentanyl, and lactated ringers. His primarily concern today is the Back Pain (low)  Initial Vital Signs: Blood pressure (!) 148/89, pulse 68, temperature 97.6 F (36.4 C), resp. rate 19, height 6' (1.829 m), weight 165 lb (74.8 kg), SpO2 91 %. BMI: Estimated body mass index is 22.38 kg/m as calculated from the following:   Height as of this encounter: 6' (1.829 m).   Weight as of this encounter: 165 lb (74.8 kg).  Risk Assessment: Allergies: Reviewed. He is allergic to ciprofloxacin; clarithromycin; oxycodone; penicillins; and sulfur.  Allergy Precautions: None required Coagulopathies: Reviewed. None identified.  Blood-thinner therapy: None at this time Active Infection(s): Reviewed. None identified. Mr. Deason is afebrile  Site Confirmation: Mr. Vise was asked to confirm the procedure and laterality before marking the site Procedure checklist: Completed Consent: Before the procedure and under the influence of no sedative(s), amnesic(s), or anxiolytics, the patient was informed of the treatment options, risks and possible complications. To fulfill our ethical and legal obligations, as recommended by the American Medical Association's Code of Ethics, I have informed the patient of my clinical impression; the nature and purpose of the treatment or procedure; the risks, benefits, and possible complications of the  intervention; the alternatives, including doing nothing; the risk(s) and benefit(s) of the alternative treatment(s) or procedure(s); and the risk(s) and benefit(s) of doing nothing. The patient was provided information about the general risks and possible complications associated with the procedure. These may include, but  are not limited to: failure to achieve desired goals, infection, bleeding, organ or nerve damage, allergic reactions, paralysis, and death. In addition, the patient was informed of those risks and complications associated to Spine-related procedures, such as failure to decrease pain; infection (i.e.: Meningitis, epidural or intraspinal abscess); bleeding (i.e.: epidural hematoma, subarachnoid hemorrhage, or any other type of intraspinal or peri-dural bleeding); organ or nerve damage (i.e.: Any type of peripheral nerve, nerve root, or spinal cord injury) with subsequent damage to sensory, motor, and/or autonomic systems, resulting in permanent pain, numbness, and/or weakness of one or several areas of the body; allergic reactions; (i.e.: anaphylactic reaction); and/or death. Furthermore, the patient was informed of those risks and complications associated with the medications. These include, but are not limited to: allergic reactions (i.e.: anaphylactic or anaphylactoid reaction(s)); adrenal axis suppression; blood sugar elevation that in diabetics may result in ketoacidosis or comma; water retention that in patients with history of congestive heart failure may result in shortness of breath, pulmonary edema, and decompensation with resultant heart failure; weight gain; swelling or edema; medication-induced neural toxicity; particulate matter embolism and blood vessel occlusion with resultant organ, and/or nervous system infarction; and/or aseptic necrosis of one or more joints. Finally, the patient was informed that Medicine is not an exact science; therefore, there is also the possibility of  unforeseen or unpredictable risks and/or possible complications that may result in a catastrophic outcome. The patient indicated having understood very clearly. We have given the patient no guarantees and we have made no promises. Enough time was given to the patient to ask questions, all of which were answered to the patient's satisfaction. Mr. Tesch has indicated that he wanted to continue with the procedure. Attestation: I, the ordering provider, attest that I have discussed with the patient the benefits, risks, side-effects, alternatives, likelihood of achieving goals, and potential problems during recovery for the procedure that I have provided informed consent. Date: 11/15/2016; Time: 9:12 AM  Pre-Procedure Preparation:  Monitoring: As per clinic protocol. Respiration, ETCO2, SpO2, BP, heart rate and rhythm monitor placed and checked for adequate function Safety Precautions: Patient was assessed for positional comfort and pressure points before starting the procedure. Time-out: I initiated and conducted the "Time-out" before starting the procedure, as per protocol. The patient was asked to participate by confirming the accuracy of the "Time Out" information. Verification of the correct person, site, and procedure were performed and confirmed by me, the nursing staff, and the patient. "Time-out" conducted as per Joint Commission's Universal Protocol (UP.01.01.01). "Time-out" Date & Time: 11/15/2016; 0844 hrs.  Description of Procedure Process:   Position: Prone Target Area: For Lumbar Facet blocks, the target is the groove formed by the junction of the transverse process and superior articular process. For the L5 dorsal ramus, the target is the notch between superior articular process and sacral ala.  Approach: Paraspinal approach. Area Prepped: Entire Posterior Lumbosacral Region Prepping solution: Hibiclens (4.0% Chlorhexidine gluconate solution) Safety Precautions: Aspiration looking for blood  return was conducted prior to all injections. At no point did we inject any substances, as a needle was being advanced. No attempts were made at seeking any paresthesias. Safe injection practices and needle disposal techniques used. Medications properly checked for expiration dates. SDV (single dose vial) medications used. Description of the Procedure: Protocol guidelines were followed. The patient was placed in position over the fluoroscopy table. The target area was identified and the area prepped in the usual manner. Skin desensitized using vapocoolant spray. Skin & deeper tissues  infiltrated with local anesthetic. Appropriate amount of time allowed to pass for local anesthetics to take effect. Radiofrequency needles were introduced to the area of the medial branch at the junction of the superior articular process and transverse process using fluoroscopy. Using the [employer], sensory stimulation using 50 Hz was used to locate & identify the nerve, making sure that the needle was positioned such that there was no sensory stimulation below 0.3 V or above 0.7 V. Stimulation using 2 Hz was used to evaluate the motor component. Care was taken not to lesion any nerves that demonstrated motor stimulation of the lower extremities at an output of less than 2.5 times that of the sensory threshold, or a maximum of 2.0 V. Once satisfactory placement of the needles was achieved, the above solution was slowly injected after negative aspiration. After waiting for at least 2 minutes, the ablation was performed at 80 degrees C for 90 seconds.The needles were then removed and the area cleansed, making sure to leave some of the prepping solution back to take advantage of its long term bactericidal properties. Intra-operative Compliance: Compliant  Illustration of the posterior view of the lumbar spine and the posterior neural structures. Laminae of L2 through S1 are labeled. DPRL5, dorsal primary ramus of  L5; DPRS1, dorsal primary ramus of S1; DPR3, dorsal primary ramus of L3; FJ, facet (zygapophyseal) joint L3-L4; I, inferior articular process of L4; LB1, lateral branch of dorsal primary ramus of L1; IAB, inferior articular branches from L3 medial branch (supplies L4-L5 facet joint); IBP, intermediate branch plexus; MB3, medial branch of dorsal primary ramus of L3; NR3, third lumbar nerve root; S, superior articular process of L5; SAB, superior articular branches from L4 (supplies L4-5 facet joint also); TP3, transverse process of L3.  Vitals:   11/15/16 0907 11/15/16 0917 11/15/16 0927 11/15/16 0937  BP: (!) 148/89 (!) 163/64 (!) 154/69 (!) 161/71  Pulse: 68 60 (!) 58 60  Resp: 19 13 17 20   Temp:      SpO2: 91% 94% 94% 96%  Weight:      Height:        Start Time: 0845 hrs. End Time: 0907 hrs. Materials & Medications:  Needle(s) Type: Teflon-coated, curved tip, Radiofrequency needle(s) Gauge: 22G Length: 10cm Medication(s): We administered lactated ringers, fentaNYL, lidocaine (PF), ropivacaine (PF) 2 mg/mL (0.2%), and dexamethasone. Please see chart orders for dosing details. 0.33 of Decadron 10mg /cc at each level post ablation, flushed with 2% Lidocaine each level prior to needle removal Imaging Guidance (Spinal):  Type of Imaging Technique: Fluoroscopy Guidance (Spinal) Indication(s): Assistance in needle guidance and placement for procedures requiring needle placement in or near specific anatomical locations not easily accessible without such assistance. Exposure Time: Please see nurses notes. Contrast: None used. Fluoroscopic Guidance: I was personally present during the use of fluoroscopy. "Tunnel Vision Technique" used to obtain the best possible view of the target area. Parallax error corrected before commencing the procedure. "Direction-depth-direction" technique used to introduce the needle under continuous pulsed fluoroscopy. Once target was reached, antero-posterior, oblique, and  lateral fluoroscopic projection used confirm needle placement in all planes. Images permanently stored in EMR. Interpretation: No contrast injected. I personally interpreted the imaging intraoperatively. Adequate needle placement confirmed in multiple planes. Permanent images saved into the patient's record.  Antibiotic Prophylaxis:  Indication(s): None identified Antibiotic given: None  Post-operative Assessment:  EBL: None Complications: No immediate post-treatment complications observed by team, or reported by patient. Note: The patient tolerated the entire procedure well.  A repeat set of vitals were taken after the procedure and the patient was kept under observation following institutional policy, for this type of procedure. Post-procedural neurological assessment was performed, showing return to baseline, prior to discharge. The patient was provided with post-procedure discharge instructions, including a section on how to identify potential problems. Should any problems arise concerning this procedure, the patient was given instructions to immediately contact us, at any time, without hesitation. In any case, we plan to contact the patient by telephone for a follow-up status report regarding this interventional procedure. Comments:  No additional relevant information. 5 out of 5 strength bilateral lower extremity: Plantar flexion, dorsiflexion, knee flexion, knee extension. Plan of Care   Imaging Orders     DG C-Arm 1-60 Min-No Report  Procedure Orders     Radiofrequency,Lumbar RETURN FOR contra-lateral RFA- (RIGHT side) Medications ordered for procedure: Meds ordered this encounter  Medications  . lactated ringers infusion 1,000 mL  . fentaNYL (SUBLIMAZE) injection 25-50 mcg    Make sure Narcan is available in the pyxis when using this medication. In the event of respiratory depression (RR< 8/min): Titrate NARCAN (naloxone) in increments of 0.1 to 0.2 mg IV at 2-3 minute intervals,  until desired degree of reversal.  . lidocaine (PF) (XYLOCAINE) 1 % injection 10 mL  . ropivacaine (PF) 2 mg/mL (0.2%) (NAROPIN) injection 10 mL  . dexamethasone (DECADRON) injection 10 mg   Medications administered: We administered lactated ringers, fentaNYL, lidocaine (PF), ropivacaine (PF) 2 mg/mL (0.2%), and dexamethasone.  See the medical record for exact dosing, route, and time of administration.  New Prescriptions   No medications on file   Disposition: Discharge home  Discharge Date & Time: 11/15/2016;   hrs.   Physician-requested Follow-up: Return in about 9 days (around 11/24/2016). Future Appointments Date Time Provider [place]  12/28/2016 10:00 AM Gillis Santa, MD [facility] None  04/01/2017 9:00 AM Hollice Espy, MD [facility] None   Primary Care Physician: [name], MD Location: [facility] Note by: Gillis Santa, MD Date: 11/15/2016; Time: 9:44 AM  Disclaimer:  Medicine is not an exact science. The only guarantee in medicine is that nothing is guaranteed. It is important to note that the decision to proceed with this intervention was based on the information collected from the patient. The Data and conclusions were drawn from the patient's questionnaire, the interview, and the physical examination. Because the information was provided in large part by the patient, it cannot be guaranteed that it has not been purposely or unconsciously manipulated. Every effort has been made to obtain as much relevant data as possible for this evaluation. It is important to note that the conclusions that lead to this procedure are derived in large part from the available data. Always take into account that the treatment will also be dependent on availability of resources and existing treatment guidelines, considered by other Pain Management Practitioners as being common knowledge and practice, at the time of the intervention. For Medico-Legal  purposes, it is also important to point out that variation in procedural techniques and pharmacological choices are the acceptable norm. The indications, contraindications, technique, and results of the above procedure should only be interpreted and judged by a Board-Certified Interventional Pain Specialist with extensive familiarity and expertise in the same exact procedure and technique.

## 2016-11-30 ENCOUNTER — Ambulatory Visit: Payer: PPO | Admitting: Student in an Organized Health Care Education/Training Program

## 2016-12-01 ENCOUNTER — Ambulatory Visit (HOSPITAL_BASED_OUTPATIENT_CLINIC_OR_DEPARTMENT_OTHER): Payer: PPO | Admitting: Student in an Organized Health Care Education/Training Program

## 2016-12-01 ENCOUNTER — Ambulatory Visit
Admission: RE | Admit: 2016-12-01 | Discharge: 2016-12-01 | Disposition: A | Discharge status: Home / Self Care | Payer: PPO | Source: Ambulatory Visit | Attending: Student in an Organized Health Care Education/Training Program | Admitting: Student in an Organized Health Care Education/Training Program

## 2016-12-01 ENCOUNTER — Encounter: Payer: Self-pay | Admitting: Student in an Organized Health Care Education/Training Program

## 2016-12-01 VITALS — BP 166/70 | HR 65 | Temp 97.9°F | Resp 18 | Ht 72.0 in | Wt 165.0 lb

## 2016-12-01 DIAGNOSIS — M47816 Spondylosis without myelopathy or radiculopathy, lumbar region: Secondary | ICD-10-CM | POA: Insufficient documentation

## 2016-12-01 DIAGNOSIS — M5136 Other intervertebral disc degeneration, lumbar region: Secondary | ICD-10-CM

## 2016-12-01 DIAGNOSIS — M79605 Pain in left leg: Secondary | ICD-10-CM | POA: Diagnosis present

## 2016-12-01 DIAGNOSIS — M545 Low back pain: Secondary | ICD-10-CM | POA: Diagnosis present

## 2016-12-01 MED ORDER — LIDOCAINE HCL (PF) 1 % IJ SOLN
10.0000 mL | Freq: Once | INTRAMUSCULAR | Status: AC
  Administered 2016-12-01: 5 mL
  Filled 2016-12-01: qty 10

## 2016-12-01 MED ORDER — DEXAMETHASONE SODIUM PHOSPHATE 10 MG/ML IJ SOLN
10.0000 mg | Freq: Once | INTRAMUSCULAR | Status: AC
  Administered 2016-12-01: 10 mg
  Filled 2016-12-01: qty 1

## 2016-12-01 MED ORDER — ROPIVACAINE HCL 2 MG/ML IJ SOLN
10.0000 mL | Freq: Once | INTRAMUSCULAR | Status: AC
  Administered 2016-12-01: 10 mL
  Filled 2016-12-01: qty 10

## 2016-12-01 MED ORDER — LACTATED RINGERS IV SOLN
1000.0000 mL | Freq: Once | INTRAVENOUS | Status: AC
  Administered 2016-12-01: 1000 mL via INTRAVENOUS

## 2016-12-01 MED ORDER — FENTANYL CITRATE (PF) 100 MCG/2ML IJ SOLN
25.0000 ug | INTRAMUSCULAR | Status: DC | PRN
  Administered 2016-12-01: 50 ug via INTRAVENOUS
  Filled 2016-12-01: qty 2

## 2016-12-01 NOTE — Patient Instructions (Signed)

## 2016-12-01 NOTE — Progress Notes (Signed)
Patient's Name: Todd Bennett  MRN: [id number]  Referring Provider: Gillis Santa, MD  DOB: 10/12/1932  PCP: [name], MD  DOS: 12/01/2016  Note by: Gillis Santa, MD  Service setting: Ambulatory outpatient  Specialty: Interventional Pain Management  Patient type: Established  Location: [facility] (AMB) Pain Management Facility  Visit type: Interventional Procedure   Primary Reason for Visit: Interventional Pain Management Treatment. CC: Back Pain (low) and Leg Pain (left posterior to knee)  Procedure:  Anesthesia, Analgesia, Anxiolysis:  Type: Therapeutic Medial Branch Facet Radiofrequency Ablation Region: Lumbar Level:  L3, L4, L5,  Medial Branch Level(s) Laterality: Right-Sided  Type: Local Anesthesia with Moderate (Conscious) Sedation Local Anesthetic: Lidocaine 1% Route: Intravenous (IV) IV Access: Secured Sedation: Meaningful verbal contact was maintained at all times during the procedure  Indication(s): Analgesia and Anxiety   Indications: 1. Spondylosis without myelopathy or radiculopathy, lumbar region   2. Lumbar degenerative disc disease   3. Lumbar spondylosis    Mr. Milks has either failed to respond, was unable to tolerate, or simply did not get enough benefit from other more conservative therapies including, but not limited to: 1. Over-the-counter medications 2. Anti-inflammatory medications 3. Muscle relaxants 4. Membrane stabilizers 5. Opioids 6. Physical therapy 7. Modalities (Heat, ice, etc.) 8. Invasive techniques such as nerve blocks.  Mr. Stutsman has attained more than 70% relief of the pain from a series of diagnostic injections conducted in separate occasions.  Pain Score: Pre-procedure: 2 /10 Post-procedure: 0-No pain/10  Pre-op Assessment:  Mr. Breeden is a 83 y.o. (year old), male patient, seen today for interventional treatment. He  has a past surgical history that includes Ileostomy; Lithotripsy (03/2013); and Hernia repair (1993). Mr. Santistevan has a current  medication list which includes the following prescription(s): vitamin c, vitamin c, aspirin ec, aspirin ec, cholecalciferol, cholecalciferol, coq-10, dapsone, diazepam, finasteride, finasteride, fluoxetine, fluoxetine, fluoxetine, fluoxetine, fluoxetine, meloxicam, metronidazole, metronidazole, multi-vitamins, NONFORMULARY OR COMPOUNDED ITEM, fish oil, prednisolone acetate, prednisone, shingrix, tamsulosin, venlafaxine xr, finasteride, finasteride, and metronidazole, and the following Facility-Administered Medications: betamethasone acetate-betamethasone sodium phosphate, betamethasone acetate-betamethasone sodium phosphate, and fentanyl. His primarily concern today is the Back Pain (low) and Leg Pain (left posterior to knee)  Initial Vital Signs: Blood pressure (!) 148/89, pulse 68, temperature 97.6 F (36.4 C), resp. rate 19, height 6' (1.829 m), weight 165 lb (74.8 kg), SpO2 91 %. BMI: Estimated body mass index is 22.38 kg/m as calculated from the following:   Height as of this encounter: 6' (1.829 m).   Weight as of this encounter: 165 lb (74.8 kg).  Risk Assessment: Allergies: Reviewed. He is allergic to ciprofloxacin; clarithromycin; oxycodone; penicillins; and sulfur.  Allergy Precautions: None required Coagulopathies: Reviewed. None identified.  Blood-thinner therapy: None at this time Active Infection(s): Reviewed. None identified. Mr. Ausborn is afebrile  Site Confirmation: Mr. Daubenspeck was asked to confirm the procedure and laterality before marking the site Procedure checklist: Completed Consent: Before the procedure and under the influence of no sedative(s), amnesic(s), or anxiolytics, the patient was informed of the treatment options, risks and possible complications. To fulfill our ethical and legal obligations, as recommended by the American Medical Association's Code of Ethics, I have informed the patient of my clinical impression; the nature and purpose of the treatment or procedure; the  risks, benefits, and possible complications of the intervention; the alternatives, including doing nothing; the risk(s) and benefit(s) of the alternative treatment(s) or procedure(s); and the risk(s) and benefit(s) of doing nothing. The patient was provided information about the general  risks and possible complications associated with the procedure. These may include, but are not limited to: failure to achieve desired goals, infection, bleeding, organ or nerve damage, allergic reactions, paralysis, and death. In addition, the patient was informed of those risks and complications associated to Spine-related procedures, such as failure to decrease pain; infection (i.e.: Meningitis, epidural or intraspinal abscess); bleeding (i.e.: epidural hematoma, subarachnoid hemorrhage, or any other type of intraspinal or peri-dural bleeding); organ or nerve damage (i.e.: Any type of peripheral nerve, nerve root, or spinal cord injury) with subsequent damage to sensory, motor, and/or autonomic systems, resulting in permanent pain, numbness, and/or weakness of one or several areas of the body; allergic reactions; (i.e.: anaphylactic reaction); and/or death. Furthermore, the patient was informed of those risks and complications associated with the medications. These include, but are not limited to: allergic reactions (i.e.: anaphylactic or anaphylactoid reaction(s)); adrenal axis suppression; blood sugar elevation that in diabetics may result in ketoacidosis or comma; water retention that in patients with history of congestive heart failure may result in shortness of breath, pulmonary edema, and decompensation with resultant heart failure; weight gain; swelling or edema; medication-induced neural toxicity; particulate matter embolism and blood vessel occlusion with resultant organ, and/or nervous system infarction; and/or aseptic necrosis of one or more joints. Finally, the patient was informed that Medicine is not an exact  science; therefore, there is also the possibility of unforeseen or unpredictable risks and/or possible complications that may result in a catastrophic outcome. The patient indicated having understood very clearly. We have given the patient no guarantees and we have made no promises. Enough time was given to the patient to ask questions, all of which were answered to the patient's satisfaction. Mr. Sayegh has indicated that he wanted to continue with the procedure. Attestation: I, the ordering provider, attest that I have discussed with the patient the benefits, risks, side-effects, alternatives, likelihood of achieving goals, and potential problems during recovery for the procedure that I have provided informed consent. Date: 12/01/2016; Time: 9:12 AM  Pre-Procedure Preparation:  Monitoring: As per clinic protocol. Respiration, ETCO2, SpO2, BP, heart rate and rhythm monitor placed and checked for adequate function Safety Precautions: Patient was assessed for positional comfort and pressure points before starting the procedure. Time-out: I initiated and conducted the "Time-out" before starting the procedure, as per protocol. The patient was asked to participate by confirming the accuracy of the "Time Out" information. Verification of the correct person, site, and procedure were performed and confirmed by me, the nursing staff, and the patient. "Time-out" conducted as per Joint Commission's Universal Protocol (UP.01.01.01). "Time-out" Date & Time: 12/01/2016; 0837 hrs.  Description of Procedure Process:   Position: Prone Target Area: For Lumbar Facet blocks, the target is the groove formed by the junction of the transverse process and superior articular process. For the L5 dorsal ramus, the target is the notch between superior articular process and sacral ala.  Approach: Paraspinal approach. Area Prepped: Entire Posterior Lumbosacral Region Prepping solution: Hibiclens (4.0% Chlorhexidine gluconate  solution) Safety Precautions: Aspiration looking for blood return was conducted prior to all injections. At no point did we inject any substances, as a needle was being advanced. No attempts were made at seeking any paresthesias. Safe injection practices and needle disposal techniques used. Medications properly checked for expiration dates. SDV (single dose vial) medications used. Description of the Procedure: Protocol guidelines were followed. The patient was placed in position over the fluoroscopy table. The target area was identified and the area prepped in  the usual manner. Skin desensitized using vapocoolant spray. Skin & deeper tissues infiltrated with local anesthetic. Appropriate amount of time allowed to pass for local anesthetics to take effect. Radiofrequency needles were introduced to the area of the medial branch at the junction of the superior articular process and transverse process using fluoroscopy. Using the [employer], sensory stimulation using 50 Hz was used to locate & identify the nerve, making sure that the needle was positioned such that there was no sensory stimulation below 0.3 V or above 0.7 V. Stimulation using 2 Hz was used to evaluate the motor component. Care was taken not to lesion any nerves that demonstrated motor stimulation of the lower extremities at an output of less than 2.5 times that of the sensory threshold, or a maximum of 2.0 V. Once satisfactory placement of the needles was achieved, the above solution was slowly injected after negative aspiration. After waiting for at least 2 minutes, the ablation was performed at 80 degrees C for 90 seconds.The needles were then removed and the area cleansed, making sure to leave some of the prepping solution back to take advantage of its long term bactericidal properties. Intra-operative Compliance: Compliant  Illustration of the posterior view of the lumbar spine and the posterior neural structures. Laminae  of L2 through S1 are labeled. DPRL5, dorsal primary ramus of L5; DPRS1, dorsal primary ramus of S1; DPR3, dorsal primary ramus of L3; FJ, facet (zygapophyseal) joint L3-L4; I, inferior articular process of L4; LB1, lateral branch of dorsal primary ramus of L1; IAB, inferior articular branches from L3 medial branch (supplies L4-L5 facet joint); IBP, intermediate branch plexus; MB3, medial branch of dorsal primary ramus of L3; NR3, third lumbar nerve root; S, superior articular process of L5; SAB, superior articular branches from L4 (supplies L4-5 facet joint also); TP3, transverse process of L3.  Vitals:   12/01/16 0859 12/01/16 0909 12/01/16 0919 12/01/16 0929  BP: (!) 184/84 (!) 173/72 (!) 164/72 (!) 166/70  Pulse: 67 63 64 65  Resp: 17 20 20 18   Temp:      TempSrc:      SpO2: 91% 97% 98% 98%  Weight:      Height:        Start Time: 0840 hrs. End Time: 0856 hrs. Materials & Medications:  Needle(s) Type: Teflon-coated, curved tip, Radiofrequency needle(s) Gauge: 22G Length: 10cm Medication(s): We administered lactated ringers, fentaNYL, lidocaine (PF), ropivacaine (PF) 2 mg/mL (0.2%), and dexamethasone. Please see chart orders for dosing details. 0.33 of Decadron 10mg /cc at each level post ablation, flushed with 2% Lidocaine each level prior to needle removal Imaging Guidance (Spinal):  Type of Imaging Technique: Fluoroscopy Guidance (Spinal) Indication(s): Assistance in needle guidance and placement for procedures requiring needle placement in or near specific anatomical locations not easily accessible without such assistance. Exposure Time: Please see nurses notes. Contrast: None used. Fluoroscopic Guidance: I was personally present during the use of fluoroscopy. "Tunnel Vision Technique" used to obtain the best possible view of the target area. Parallax error corrected before commencing the procedure. "Direction-depth-direction" technique used to introduce the needle under continuous  pulsed fluoroscopy. Once target was reached, antero-posterior, oblique, and lateral fluoroscopic projection used confirm needle placement in all planes. Images permanently stored in EMR. Interpretation: No contrast injected. I personally interpreted the imaging intraoperatively. Adequate needle placement confirmed in multiple planes. Permanent images saved into the patient's record.  Antibiotic Prophylaxis:  Indication(s): None identified Antibiotic given: None  Post-operative Assessment:  EBL: None Complications: No immediate  post-treatment complications observed by team, or reported by patient. Note: The patient tolerated the entire procedure well. A repeat set of vitals were taken after the procedure and the patient was kept under observation following institutional policy, for this type of procedure. Post-procedural neurological assessment was performed, showing return to baseline, prior to discharge. The patient was provided with post-procedure discharge instructions, including a section on how to identify potential problems. Should any problems arise concerning this procedure, the patient was given instructions to immediately contact us, at any time, without hesitation. In any case, we plan to contact the patient by telephone for a follow-up status report regarding this interventional procedure. Comments:  No additional relevant information. 5 out of 5 strength bilateral lower extremity: Plantar flexion, dorsiflexion, knee flexion, knee extension. Plan of Care    Imaging Orders     DG C-Arm 1-60 Min-No Report Procedure Orders    No procedure(s) ordered today   Follow up in 6 weeks  Medications ordered for procedure: Meds ordered this encounter  Medications  . lactated ringers infusion 1,000 mL  . fentaNYL (SUBLIMAZE) injection 25-50 mcg    Make sure Narcan is available in the pyxis when using this medication. In the event of respiratory depression (RR< 8/min): Titrate NARCAN  (naloxone) in increments of 0.1 to 0.2 mg IV at 2-3 minute intervals, until desired degree of reversal.  . lidocaine (PF) (XYLOCAINE) 1 % injection 10 mL  . ropivacaine (PF) 2 mg/mL (0.2%) (NAROPIN) injection 10 mL  . dexamethasone (DECADRON) injection 10 mg   Medications administered: We administered lactated ringers, fentaNYL, lidocaine (PF), ropivacaine (PF) 2 mg/mL (0.2%), and dexamethasone.  See the medical record for exact dosing, route, and time of administration.  This SmartLink is deprecated. Use AVSMEDLIST instead to display the medication list for a patient. Disposition: Discharge home  Discharge Date & Time: 12/01/2016; 0930 hrs.   Physician-requested Follow-up: Return in about 6 weeks (around 01/12/2017) for Post Procedure Evaluation. Future Appointments  Date Time Provider [place]  01/13/2017  8:30 AM Gillis Santa, MD [facility] None  04/01/2017  9:00 AM Hollice Espy, MD [facility] None   Primary Care Physician: [name], MD Location: [facility] Note by: Gillis Santa, MD Date: 12/01/2016; Time: 11:31 AM  Disclaimer:  Medicine is not an exact science. The only guarantee in medicine is that nothing is guaranteed. It is important to note that the decision to proceed with this intervention was based on the information collected from the patient. The Data and conclusions were drawn from the patient's questionnaire, the interview, and the physical examination. Because the information was provided in large part by the patient, it cannot be guaranteed that it has not been purposely or unconsciously manipulated. Every effort has been made to obtain as much relevant data as possible for this evaluation. It is important to note that the conclusions that lead to this procedure are derived in large part from the available data. Always take into account that the treatment will also be dependent on availability of resources and existing treatment  guidelines, considered by other Pain Management Practitioners as being common knowledge and practice, at the time of the intervention. For Medico-Legal purposes, it is also important to point out that variation in procedural techniques and pharmacological choices are the acceptable norm. The indications, contraindications, technique, and results of the above procedure should only be interpreted and judged by a Board-Certified Interventional Pain Specialist with extensive familiarity and expertise in the same exact procedure and  technique.

## 2016-12-01 NOTE — Progress Notes (Signed)
Safety precautions to be maintained throughout the outpatient stay will include: orient to surroundings, keep bed in low position, maintain call bell within reach at all times, provide assistance with transfer out of bed and ambulation.  

## 2016-12-02 ENCOUNTER — Telehealth: Payer: Self-pay | Admitting: *Deleted

## 2016-12-02 NOTE — Telephone Encounter (Signed)
No problems post procedure. 

## 2016-12-28 ENCOUNTER — Ambulatory Visit: Payer: PPO | Admitting: Student in an Organized Health Care Education/Training Program

## 2017-01-07 DIAGNOSIS — N401 Enlarged prostate with lower urinary tract symptoms: Secondary | ICD-10-CM | POA: Diagnosis not present

## 2017-01-07 DIAGNOSIS — F419 Anxiety disorder, unspecified: Secondary | ICD-10-CM | POA: Diagnosis not present

## 2017-01-07 DIAGNOSIS — M5442 Lumbago with sciatica, left side: Secondary | ICD-10-CM | POA: Diagnosis not present

## 2017-01-07 DIAGNOSIS — M5441 Lumbago with sciatica, right side: Secondary | ICD-10-CM | POA: Diagnosis not present

## 2017-01-13 ENCOUNTER — Ambulatory Visit
Payer: PPO | Attending: Student in an Organized Health Care Education/Training Program | Admitting: Student in an Organized Health Care Education/Training Program

## 2017-01-13 ENCOUNTER — Other Ambulatory Visit: Payer: Self-pay

## 2017-01-13 ENCOUNTER — Encounter: Payer: Self-pay | Admitting: Student in an Organized Health Care Education/Training Program

## 2017-01-13 VITALS — BP 139/61 | HR 68 | Temp 98.2°F | Resp 16 | Ht 72.0 in | Wt 165.0 lb

## 2017-01-13 DIAGNOSIS — F419 Anxiety disorder, unspecified: Secondary | ICD-10-CM | POA: Diagnosis not present

## 2017-01-13 DIAGNOSIS — M545 Low back pain: Secondary | ICD-10-CM | POA: Diagnosis present

## 2017-01-13 DIAGNOSIS — Z79899 Other long term (current) drug therapy: Secondary | ICD-10-CM | POA: Insufficient documentation

## 2017-01-13 DIAGNOSIS — M47816 Spondylosis without myelopathy or radiculopathy, lumbar region: Secondary | ICD-10-CM | POA: Diagnosis not present

## 2017-01-13 DIAGNOSIS — F329 Major depressive disorder, single episode, unspecified: Secondary | ICD-10-CM | POA: Insufficient documentation

## 2017-01-13 DIAGNOSIS — Z7982 Long term (current) use of aspirin: Secondary | ICD-10-CM | POA: Insufficient documentation

## 2017-01-13 DIAGNOSIS — M5136 Other intervertebral disc degeneration, lumbar region: Secondary | ICD-10-CM | POA: Diagnosis not present

## 2017-01-13 DIAGNOSIS — N138 Other obstructive and reflux uropathy: Secondary | ICD-10-CM | POA: Insufficient documentation

## 2017-01-13 DIAGNOSIS — E785 Hyperlipidemia, unspecified: Secondary | ICD-10-CM | POA: Insufficient documentation

## 2017-01-13 DIAGNOSIS — Z87442 Personal history of urinary calculi: Secondary | ICD-10-CM | POA: Diagnosis not present

## 2017-01-13 DIAGNOSIS — Z87891 Personal history of nicotine dependence: Secondary | ICD-10-CM | POA: Insufficient documentation

## 2017-01-13 DIAGNOSIS — N401 Enlarged prostate with lower urinary tract symptoms: Secondary | ICD-10-CM | POA: Insufficient documentation

## 2017-01-13 NOTE — Progress Notes (Signed)
Patient's Name: Todd Bennett  MRN: [id number]  Referring Provider: [name], MD  DOB: 27-Sep-1932  PCP: [name], MD  DOS: 01/13/2017  Note by: Gillis Santa, MD  Service setting: Ambulatory outpatient  Specialty: Interventional Pain Management  Location: [facility] (AMB) Pain Management Facility    Patient type: Established   Primary Reason(s) for Visit: Encounter for post-procedure evaluation of chronic illness with mild to moderate exacerbation CC: Back Pain (lower)  HPI  Mr. Hollomon is a 83 y.o. year old, male patient, who comes today for a post-procedure evaluation. He has Anxiety; Benign prostatic hyperplasia with urinary obstruction; Benign prostatic hypertrophy without urinary obstruction; Bladder calculi; Calculus of kidney; Calculi, ureter; Clinical depression; Pilar Plate hematuria; and HLD (hyperlipidemia) on their problem list. His primarily concern today is the Back Pain (lower)  Pain Assessment: Location: Lower Back Radiating: Denies Onset: More than a month ago Duration: Chronic pain Quality: Nagging Severity: 0-No pain/10 (self-reported pain score)  Note: Reported level is compatible with observation.                         When using our objective Pain Scale, levels between 6 and 10/10 are said to belong in an emergency room, as it progressively worsens from a 6/10, described as severely limiting, requiring emergency care not usually available at an outpatient pain management facility. At a 6/10 level, communication becomes difficult and requires great effort. Assistance to reach the emergency department may be required. Facial flushing and profuse sweating along with potentially dangerous increases in heart rate and blood pressure will be evident. Effect on ADL:   Timing: Intermittent Modifying factors: meds  Mr. Tuggle comes in today for post-procedure evaluation after the treatment done on 12/01/2016.  Further details on both, my assessment(s), as well as the proposed  treatment plan, please see below.  Post-Procedure Assessment  12/01/2016 Procedure: Right L3-L4-L5 radiofrequency ablation Pre-procedure pain score:  2/10 Post-procedure pain score: 0/10         Influential Factors: BMI: 22.38 kg/m Intra-procedural challenges: None observed.         Assessment challenges: None detected.              Reported side-effects: None.        Post-procedural adverse reactions or complications: None reported         Sedation: Please see nurses note. When no sedatives are used, the analgesic levels obtained are directly associated to the effectiveness of the local anesthetics. However, when sedation is provided, the level of analgesia obtained during the initial 1 hour following the intervention, is believed to be the result of a combination of factors. These factors may include, but are not limited to: 1. The effectiveness of the local anesthetics used. 2. The effects of the analgesic(s) and/or anxiolytic(s) used. 3. The degree of discomfort experienced by the patient at the time of the procedure. 4. The patients ability and reliability in recalling and recording the events. 5. The presence and influence of possible secondary gains and/or psychosocial factors. Reported result: Relief experienced during the 1st hour after the procedure: 100 % (Ultra-Short Term Relief)            Interpretative annotation: Clinically appropriate result. Analgesia during this period is likely to be Local Anesthetic and/or IV Sedative (Analgesic/Anxiolytic) related.          Effects of local anesthetic: The analgesic effects attained during this period are directly associated to the localized infiltration of local anesthetics  and therefore cary significant diagnostic value as to the etiological location, or anatomical origin, of the pain. Expected duration of relief is directly dependent on the pharmacodynamics of the local anesthetic used. Long-acting (4-6 hours) anesthetics used.   Reported result: Relief during the next 4 to 6 hour after the procedure: 100 % (Short-Term Relief)            Interpretative annotation: Clinically appropriate result. Analgesia during this period is likely to be Local Anesthetic-related.          Long-term benefit: Defined as the period of time past the expected duration of local anesthetics (1 hour for short-acting and 4-6 hours for long-acting). With the possible exception of prolonged sympathetic blockade from the local anesthetics, benefits during this period are typically attributed to, or associated with, other factors such as analgesic sensory neuropraxia, antiinflammatory effects, or beneficial biochemical changes provided by agents other than the local anesthetics.  Reported result: Extended relief following procedure: 80 % (Long-Term Relief)            Interpretative annotation: Clinically appropriate result. Good relief. Therapeutic success. Inflammation plays a part in the etiology to the pain.          Current benefits: Defined as reported results that persistent at this point in time.   Analgesia: 90 % Mr. Lares reports that both, extremity and the axial pain improved with the treatment. Function: Mr. Hayduk reports improvement in function ROM: Mr. Roblero reports improvement in ROM Interpretative annotation: Complete relief. Therapeutic success. Effective therapeutic approach.          Interpretation: Results would suggest a successful diagnostic and therapeutic intervention.                  Plan:  Please see "Plan of Care" for details.        Laboratory Chemistry  Inflammation Markers (CRP: Acute Phase) (ESR: Chronic Phase) No results found for: CRP, ESRSEDRATE, LATICACIDVEN               Rheumatology Markers No results found for: RF, ANA, LABURIC, URICUR, LYMEIGGIGMAB, LYMEABIGMQN              Renal Function Markers Lab Results  Component Value Date   BUN 21 (H) 02/20/2013   CREATININE 1.05 02/20/2013   GFRAA >60  02/20/2013   GFRNONAA >60 02/20/2013                 Hepatic Function Markers Lab Results  Component Value Date   AST 38 (H) 02/20/2013   ALT 62 02/20/2013   ALBUMIN 3.8 02/20/2013   ALKPHOS 121 (H) 02/20/2013   LIPASE 177 02/07/2013                 Electrolytes Lab Results  Component Value Date   NA 135 (L) 02/20/2013   K 3.9 02/20/2013   CL 105 02/20/2013   CALCIUM 9.2 02/20/2013                 Neuropathy Markers No results found for: VITAMINB12, FOLATE, HGBA1C, HIV               Bone Pathology Markers No results found for: VD25OH, VD125OH2TOT, [id number], CH8527PO2, 25OHVITD1, 25OHVITD2, 25OHVITD3, TESTOFREE, TESTOSTERONE               Coagulation Parameters Lab Results  Component Value Date   PLT 255 02/20/2013                 Cardiovascular  Markers Lab Results  Component Value Date   TROPONINI < 0.02 02/17/2013   HGB 16.9 02/20/2013   HCT 50.4 02/20/2013                 CA Markers No results found for: CEA, CA125, LABCA2               Note: Lab results reviewed.  Recent Diagnostic Imaging Results  DG C-Arm 1-60 Min-No Report Fluoroscopy was utilized by the requesting physician.  No radiographic  interpretation.   Complexity Note: Imaging results reviewed. Results shared with Mr. Prinsen, using Layman's terms.                         Meds   Current Outpatient Medications:  .  Ascorbic Acid (VITAMIN C) 1000 MG tablet, Take by mouth., Disp: , Rfl:  .  aspirin EC 81 MG tablet, Take by mouth., Disp: , Rfl:  .  Cholecalciferol (VITAMIN D-1000 MAX ST) 1000 UNITS tablet, Take 1,000 Units by mouth daily. , Disp: , Rfl:  .  diazepam (VALIUM) 5 MG tablet, TAKE 1/2 TABLET BY MOUTH EVERY 12 HOURS AS NEEDED FOR ANXIETY, Disp: , Rfl:  .  FLUoxetine (PROZAC) 20 MG capsule, Take 30 mg by mouth daily. , Disp: , Rfl:  .  Multiple Vitamin (MULTI-VITAMINS) TABS, Take by mouth daily. , Disp: , Rfl:  .  NONFORMULARY OR COMPOUNDED ITEM, Apply 1-2 g topically 4 (four) times  daily. (Patient taking differently: Apply 1-2 g topically as needed. ), Disp: 120 each, Rfl: 2 .  Omega-3 Fatty Acids (FISH OIL) 1000 MG CAPS, Take by mouth daily. , Disp: , Rfl:  .  prednisoLONE acetate (PRED FORTE) 1 % ophthalmic suspension, Place 1 drop into the right eye daily., Disp: , Rfl:  .  SHINGRIX injection, , Disp: , Rfl:  .  tamsulosin (FLOMAX) 0.4 MG CAPS capsule, Take 0.4 mg by mouth daily. , Disp: , Rfl:  .  venlafaxine XR (EFFEXOR-XR) 150 MG 24 hr capsule, Take 150 mg by mouth daily with breakfast. , Disp: , Rfl:  .  Ascorbic Acid (VITAMIN C) 1000 MG tablet, Take 1,000 mg by mouth., Disp: , Rfl:  .  aspirin EC 81 MG tablet, daily. , Disp: , Rfl:  .  Cholecalciferol (VITAMIN D-1000 MAX ST) 1000 units tablet, Take by mouth., Disp: , Rfl:  .  Coenzyme Q10 (COQ-10) 200 MG CAPS, Take 200 mg by mouth., Disp: , Rfl:  .  Dapsone (ACZONE) 5 % topical gel, , Disp: , Rfl:  .  finasteride (PROSCAR) 5 MG tablet, TAKE 1 TABLET EVERY DAY (Patient not taking: Reported on 01/13/2017), Disp: 90 tablet, Rfl: 2 .  finasteride (PROSCAR) 5 MG tablet, TAKE 1 TABLET EVERY DAY (Patient not taking: Reported on 11/03/2016), Disp: 90 tablet, Rfl: 2 .  finasteride (PROSCAR) 5 MG tablet, TAKE 1 TABLET EVERY DAY (Patient not taking: Reported on 11/03/2016), Disp: 90 tablet, Rfl: 2 .  finasteride (PROSCAR) 5 MG tablet, Take by mouth., Disp: , Rfl:  .  FLUoxetine (PROZAC) 10 MG capsule, , Disp: , Rfl:  .  FLUoxetine (PROZAC) 10 MG capsule, Take by mouth., Disp: , Rfl:  .  FLUoxetine (PROZAC) 10 MG capsule, Take by mouth., Disp: , Rfl:  .  FLUoxetine (PROZAC) 20 MG capsule, Take by mouth., Disp: , Rfl:  .  meloxicam (MOBIC) 15 MG tablet, Take by mouth., Disp: , Rfl:  .  metroNIDAZOLE (METROGEL) 0.75 % gel,  Apply 1 application topically daily. , Disp: , Rfl:  .  metroNIDAZOLE (METROGEL) 1 % gel, Apply topically., Disp: , Rfl:  .  metroNIDAZOLE (METROGEL) 1 % gel, Apply topically., Disp: , Rfl:  .  predniSONE  (DELTASONE) 10 MG tablet, Take 10 mg by mouth daily with breakfast. , Disp: , Rfl:   Current Facility-Administered Medications:  .  betamethasone acetate-betamethasone sodium phosphate (CELESTONE) injection 12 mg, 12 mg, Intramuscular, Once, Evans, Brent M, DPM .  betamethasone acetate-betamethasone sodium phosphate (CELESTONE) injection 3 mg, 3 mg, Intramuscular, Once, Evans, Brent M, DPM  ROS  Constitutional: Denies any fever or chills Gastrointestinal: No reported hemesis, hematochezia, vomiting, or acute GI distress Musculoskeletal: Denies any acute onset joint swelling, redness, loss of ROM, or weakness Neurological: No reported episodes of acute onset apraxia, aphasia, dysarthria, agnosia, amnesia, paralysis, loss of coordination, or loss of consciousness  Allergies  Mr. Querry is allergic to ciprofloxacin; clarithromycin; oxycodone; penicillins; and sulfur.  [place]  Drug: Mr. Guidotti  reports that he does not use drugs. Alcohol:  reports that he does not drink alcohol. Tobacco:  reports that he has quit smoking. he has never used smokeless tobacco. Medical:  has a past medical history of Allergy, Anxiety (06/17/2014), Benign prostatic hyperplasia with urinary obstruction (06/17/2014), Benign prostatic hypertrophy without urinary obstruction (06/20/2013), Bladder calculi (04/26/2013), Calculi, ureter (03/29/2013), Calculus of kidney (02/25/2013), Cataract, Clinical depression (06/17/2014), Degenerative disc disease, lumbar, Frank hematuria (06/28/2013), HLD (hyperlipidemia) (06/17/2014), and Spondylolysis. Surgical: Mr. Medlen  has a past surgical history that includes Ileostomy; Lithotripsy (03/2013); and Hernia repair (1993). Family: family history includes Diabetes in his mother.  Constitutional Exam  General appearance: Well nourished, well developed, and well hydrated. In no apparent acute distress Vitals:   01/13/17 0825  BP: 139/61  Pulse: 68  Resp: 16  Temp: 98.2 F (36.8 C)  TempSrc: Oral   SpO2: 98%  Weight: 165 lb (74.8 kg)  Height: 6' (1.829 m)   BMI Assessment: Estimated body mass index is 22.38 kg/m as calculated from the following:   Height as of this encounter: 6' (1.829 m).   Weight as of this encounter: 165 lb (74.8 kg).  BMI interpretation table: BMI level Category Range association with higher incidence of chronic pain  <18 kg/m2 Underweight   18.5-24.9 kg/m2 Ideal body weight   25-29.9 kg/m2 Overweight Increased incidence by 20%  30-34.9 kg/m2 Obese (Class I) Increased incidence by 68%  35-39.9 kg/m2 Severe obesity (Class II) Increased incidence by 136%  >40 kg/m2 Extreme obesity (Class III) Increased incidence by 254%   BMI Readings from Last 4 Encounters:  01/13/17 22.38 kg/m  12/01/16 22.38 kg/m  11/15/16 22.38 kg/m  11/03/16 22.38 kg/m   Wt Readings from Last 4 Encounters:  01/13/17 165 lb (74.8 kg)  12/01/16 165 lb (74.8 kg)  11/15/16 165 lb (74.8 kg)  11/03/16 165 lb (74.8 kg)  Psych/Mental status: Alert, oriented x 3 (person, place, & time)       Eyes: PERLA Respiratory: No evidence of acute respiratory distress  Cervical Spine Area Exam  Skin & Axial Inspection: No masses, redness, edema, swelling, or associated skin lesions Alignment: Symmetrical Functional ROM: Unrestricted ROM      Stability: No instability detected Muscle Tone/Strength: Functionally intact. No obvious neuro-muscular anomalies detected. Sensory (Neurological): Unimpaired Palpation: No palpable anomalies              Upper Extremity (UE) Exam    Side: Right upper extremity  Side: Left upper extremity  Skin & Extremity Inspection: Skin color, temperature, and hair growth are WNL. No peripheral edema or cyanosis. No masses, redness, swelling, asymmetry, or associated skin lesions. No contractures.  Skin & Extremity Inspection: Skin color, temperature, and hair growth are WNL. No peripheral edema or cyanosis. No masses, redness, swelling, asymmetry, or associated skin  lesions. No contractures.  Functional ROM: Unrestricted ROM          Functional ROM: Unrestricted ROM          Muscle Tone/Strength: Functionally intact. No obvious neuro-muscular anomalies detected.  Muscle Tone/Strength: Functionally intact. No obvious neuro-muscular anomalies detected.  Sensory (Neurological): Unimpaired          Sensory (Neurological): Unimpaired          Palpation: No palpable anomalies              Palpation: No palpable anomalies              Specialized Test(s): Deferred         Specialized Test(s): Deferred          Thoracic Spine Area Exam  Skin & Axial Inspection: No masses, redness, or swelling Alignment: Symmetrical Functional ROM: Unrestricted ROM Stability: No instability detected Muscle Tone/Strength: Functionally intact. No obvious neuro-muscular anomalies detected. Sensory (Neurological): Unimpaired Muscle strength & Tone: No palpable anomalies  Lumbar Spine Area Exam  Skin & Axial Inspection: No masses, redness, or swelling Alignment: Symmetrical Functional ROM: Improved after treatment      Stability: No instability detected Muscle Tone/Strength: Functionally intact. No obvious neuro-muscular anomalies detected. Sensory (Neurological): Unimpaired Palpation: No palpable anomalies       Provocative Tests: Lumbar Hyperextension and rotation test: Improved after treatment       Lumbar Lateral bending test: Improved after treatment       Patrick's Maneuver: evaluation deferred today                    Gait & Posture Assessment  Ambulation: Unassisted Gait: Relatively normal for age and body habitus Posture: WNL   Lower Extremity Exam    Side: Right lower extremity  Side: Left lower extremity  Skin & Extremity Inspection: Skin color, temperature, and hair growth are WNL. No peripheral edema or cyanosis. No masses, redness, swelling, asymmetry, or associated skin lesions. No contractures.  Skin & Extremity Inspection: Skin color, temperature, and hair  growth are WNL. No peripheral edema or cyanosis. No masses, redness, swelling, asymmetry, or associated skin lesions. No contractures.  Functional ROM: Unrestricted ROM          Functional ROM: Unrestricted ROM          Muscle Tone/Strength: Functionally intact. No obvious neuro-muscular anomalies detected.  Muscle Tone/Strength: Functionally intact. No obvious neuro-muscular anomalies detected.  Sensory (Neurological): Unimpaired  Sensory (Neurological): Unimpaired  Palpation: No palpable anomalies  Palpation: No palpable anomalies   Assessment  Primary Diagnosis & Pertinent Problem List: The primary encounter diagnosis was Spondylosis without myelopathy or radiculopathy, lumbar region. Diagnoses of Lumbar degenerative disc disease and Lumbar spondylosis were also pertinent to this visit.  Status Diagnosis  Controlled Controlled Controlled 1. Spondylosis without myelopathy or radiculopathy, lumbar region   2. Lumbar degenerative disc disease   3. Lumbar spondylosis       83 year old male presents for follow-up status post right L3-L5 radiofrequency ablation.  Patient notes significant improvement in his pain symptoms, noting approximately 90% improvement in his axial low back and buttock pain.  Patient states that his pain  flares are less frequent and when they do occur the intensity of his pain is less during this pain flares.  He has greater range of motion and can ambulate for longer periods of time without as much pain.  I am very pleased with the patient's results and that the radiofrequency ablation has significantly helped his functional status and his pain symptoms.  Patient can follow-up as needed.  He is certainly welcome to contact me if his symptoms return or worsen.   Time Note: Greater than 50% of the 25 minute(s) of face-to-face time spent with Mr. Boughner, was spent in counseling/coordination of care regarding: the treatment plan, the results, interpretation and significance of   his recent diagnostic interventional treatment(s) and realistic expectations.  Provider-requested follow-up: Return if symptoms worsen or fail to improve.  Future Appointments  Date Time Provider [place]  04/01/2017  9:00 AM Hollice Espy, MD [facility] None    Primary Care Physician: [name], MD Location: [facility] Note by: Gillis Santa, M.D Date: 01/13/2017; Time: 8:56 AM  There are no Patient Instructions on file for this visit.

## 2017-01-15 ENCOUNTER — Other Ambulatory Visit: Payer: Self-pay | Admitting: Urology

## 2017-02-01 ENCOUNTER — Ambulatory Visit: Payer: PPO | Admitting: Podiatry

## 2017-02-01 ENCOUNTER — Ambulatory Visit (INDEPENDENT_AMBULATORY_CARE_PROVIDER_SITE_OTHER): Payer: PPO

## 2017-02-01 ENCOUNTER — Other Ambulatory Visit: Payer: Self-pay | Admitting: Podiatry

## 2017-02-01 ENCOUNTER — Encounter: Payer: Self-pay | Admitting: Podiatry

## 2017-02-01 DIAGNOSIS — M19079 Primary osteoarthritis, unspecified ankle and foot: Secondary | ICD-10-CM

## 2017-02-01 DIAGNOSIS — M7750 Other enthesopathy of unspecified foot: Secondary | ICD-10-CM

## 2017-02-01 DIAGNOSIS — M778 Other enthesopathies, not elsewhere classified: Secondary | ICD-10-CM

## 2017-02-01 DIAGNOSIS — M7752 Other enthesopathy of left foot: Secondary | ICD-10-CM | POA: Diagnosis not present

## 2017-02-01 DIAGNOSIS — G5792 Unspecified mononeuropathy of left lower limb: Secondary | ICD-10-CM

## 2017-02-01 DIAGNOSIS — M7751 Other enthesopathy of right foot: Secondary | ICD-10-CM | POA: Diagnosis not present

## 2017-02-01 DIAGNOSIS — M779 Enthesopathy, unspecified: Principal | ICD-10-CM

## 2017-02-01 MED ORDER — GABAPENTIN 100 MG PO CAPS: 100.0000 mg | ORAL_CAPSULE | Freq: Three times a day (TID) | ORAL | 1 refills | Status: DC

## 2017-02-02 DIAGNOSIS — H18421 Band keratopathy, right eye: Secondary | ICD-10-CM | POA: Diagnosis not present

## 2017-02-02 NOTE — Progress Notes (Signed)
    Subjective:  Patient presents today  with a complaint of tingling to bilateral feet that began 4 months ago.  He reports associated pain to bilateral feet, left worse than right.  He states he was diagnosed with arthritis in the past and states his toes are stiff and painful to move.  Walking and standing increases the pain.  He has been taking Tylenol arthritis with some relief of the pain.  Patient is here for further evaluation and treatment.   Past Medical History:  Diagnosis Date  . Allergy   . Anxiety 06/17/2014  . Benign prostatic hyperplasia with urinary obstruction 06/17/2014  . Benign prostatic hypertrophy without urinary obstruction 06/20/2013  . Bladder calculi 04/26/2013  . Calculi, ureter 03/29/2013  . Calculus of kidney 02/25/2013  . Cataract   . Clinical depression 06/17/2014  . Degenerative disc disease, lumbar   . Frank hematuria 06/28/2013  . HLD (hyperlipidemia) 06/17/2014  . Spondylolysis      Objective/Physical Exam General: The patient is alert and oriented x3 in no acute distress.  Dermatology: Skin is warm, dry and supple bilateral lower extremities. Negative for open lesions or macerations.  Vascular: Palpable pedal pulses bilaterally. No edema or erythema noted. Capillary refill within normal limits.  Neurological: Epicritic and protective threshold grossly intact bilaterally.   Musculoskeletal Exam: Range of motion within normal limits to all pedal and ankle joints bilateral. Muscle strength 5/5 in all groups bilateral.   Radiographic Exam: Arthritis/DJD through pedal joints of feet bilaterally.     Assessment: #1 Neuritis/peripheral neuropathy bilateral lower extremities #2 arthritis bilateral feet #3 capsulitis left foot  Plan of Care:  #1 Patient was evaluated.  X-rays reviewed. #2 injection of 0.5 mL Celestone Soluspan injected into the left midfoot/Lisfranc joint #3  Prescription for gabapentin 100 mg 3 times daily provided to patient. #4 continue  custom molded orthotics. #5 return to clinic in 6 weeks.   Edrick Kins, DPM [facility]  Dr. Edrick Kins, DPM    [address], [place] [address]                Office [phone]  Fax [phone]

## 2017-02-22 DIAGNOSIS — Z933 Colostomy status: Secondary | ICD-10-CM | POA: Diagnosis not present

## 2017-02-28 ENCOUNTER — Telehealth: Payer: Self-pay | Admitting: *Deleted

## 2017-02-28 MED ORDER — GABAPENTIN 100 MG PO CAPS: 100.0000 mg | ORAL_CAPSULE | Freq: Three times a day (TID) | ORAL | 3 refills | Status: DC

## 2017-02-28 NOTE — Telephone Encounter (Signed)
Refill request Gabapentin 100mg . Dr. Amalia Hailey states refill +3additional.

## 2017-03-14 ENCOUNTER — Inpatient Hospital Stay
Admission: EM | Admit: 2017-03-14 | Discharge: 2017-03-16 | DRG: 247 | Disposition: A | Discharge status: Home / Self Care | Payer: PPO | Attending: Internal Medicine | Admitting: Internal Medicine

## 2017-03-14 ENCOUNTER — Other Ambulatory Visit: Payer: Self-pay

## 2017-03-14 ENCOUNTER — Observation Stay
Admit: 2017-03-14 | Discharge: 2017-03-14 | Disposition: A | Discharge status: Home / Self Care | Payer: PPO | Attending: Family Medicine | Admitting: Family Medicine

## 2017-03-14 ENCOUNTER — Encounter: Payer: Self-pay | Admitting: Emergency Medicine

## 2017-03-14 DIAGNOSIS — Z881 Allergy status to other antibiotic agents status: Secondary | ICD-10-CM | POA: Diagnosis not present

## 2017-03-14 DIAGNOSIS — Z882 Allergy status to sulfonamides status: Secondary | ICD-10-CM

## 2017-03-14 DIAGNOSIS — Z66 Do not resuscitate: Secondary | ICD-10-CM | POA: Diagnosis present

## 2017-03-14 DIAGNOSIS — I214 Non-ST elevation (NSTEMI) myocardial infarction: Principal | ICD-10-CM | POA: Diagnosis present

## 2017-03-14 DIAGNOSIS — I2510 Atherosclerotic heart disease of native coronary artery without angina pectoris: Secondary | ICD-10-CM | POA: Diagnosis not present

## 2017-03-14 DIAGNOSIS — Z87891 Personal history of nicotine dependence: Secondary | ICD-10-CM

## 2017-03-14 DIAGNOSIS — F419 Anxiety disorder, unspecified: Secondary | ICD-10-CM | POA: Diagnosis present

## 2017-03-14 DIAGNOSIS — Z955 Presence of coronary angioplasty implant and graft: Secondary | ICD-10-CM | POA: Diagnosis not present

## 2017-03-14 DIAGNOSIS — N400 Enlarged prostate without lower urinary tract symptoms: Secondary | ICD-10-CM | POA: Diagnosis present

## 2017-03-14 DIAGNOSIS — E785 Hyperlipidemia, unspecified: Secondary | ICD-10-CM | POA: Diagnosis not present

## 2017-03-14 DIAGNOSIS — I25119 Atherosclerotic heart disease of native coronary artery with unspecified angina pectoris: Secondary | ICD-10-CM | POA: Diagnosis present

## 2017-03-14 DIAGNOSIS — R001 Bradycardia, unspecified: Secondary | ICD-10-CM | POA: Diagnosis not present

## 2017-03-14 DIAGNOSIS — Z79899 Other long term (current) drug therapy: Secondary | ICD-10-CM

## 2017-03-14 DIAGNOSIS — Z885 Allergy status to narcotic agent status: Secondary | ICD-10-CM | POA: Diagnosis not present

## 2017-03-14 DIAGNOSIS — F329 Major depressive disorder, single episode, unspecified: Secondary | ICD-10-CM | POA: Diagnosis present

## 2017-03-14 DIAGNOSIS — M5136 Other intervertebral disc degeneration, lumbar region: Secondary | ICD-10-CM | POA: Diagnosis present

## 2017-03-14 DIAGNOSIS — Z880 Allergy status to penicillin: Secondary | ICD-10-CM | POA: Diagnosis not present

## 2017-03-14 DIAGNOSIS — R079 Chest pain, unspecified: Secondary | ICD-10-CM | POA: Diagnosis not present

## 2017-03-14 DIAGNOSIS — I200 Unstable angina: Secondary | ICD-10-CM | POA: Diagnosis not present

## 2017-03-14 DIAGNOSIS — R9431 Abnormal electrocardiogram [ECG] [EKG]: Secondary | ICD-10-CM | POA: Diagnosis not present

## 2017-03-14 LAB — COMPREHENSIVE METABOLIC PANEL
ALK PHOS: 52 U/L (ref 38–126)
ALT: 23 U/L (ref 17–63)
AST: 25 U/L (ref 15–41)
Albumin: 3.9 g/dL (ref 3.5–5.0)
Anion gap: 7 (ref 5–15)
BUN: 16 mg/dL (ref 6–20)
CALCIUM: 9.3 mg/dL (ref 8.9–10.3)
CO2: 29 mmol/L (ref 22–32)
CREATININE: 0.92 mg/dL (ref 0.61–1.24)
Chloride: 104 mmol/L (ref 101–111)
Glucose, Bld: 114 mg/dL — ABNORMAL HIGH (ref 65–99)
Potassium: 4 mmol/L (ref 3.5–5.1)
SODIUM: 140 mmol/L (ref 135–145)
Total Bilirubin: 0.9 mg/dL (ref 0.3–1.2)
Total Protein: 6.3 g/dL — ABNORMAL LOW (ref 6.5–8.1)

## 2017-03-14 LAB — CBC
HEMATOCRIT: 42.8 % (ref 40.0–52.0)
Hemoglobin: 14.5 g/dL (ref 13.0–18.0)
MCH: 30.1 pg (ref 26.0–34.0)
MCHC: 33.9 g/dL (ref 32.0–36.0)
MCV: 88.7 fL (ref 80.0–100.0)
Platelets: 143 10*3/uL — ABNORMAL LOW (ref 150–440)
RBC: 4.82 MIL/uL (ref 4.40–5.90)
RDW: 13.3 % (ref 11.5–14.5)
WBC: 6.9 10*3/uL (ref 3.8–10.6)

## 2017-03-14 LAB — HEPARIN LEVEL (UNFRACTIONATED)
Heparin Unfractionated: 0.56 IU/mL (ref 0.30–0.70)
Heparin Unfractionated: 0.89 IU/mL — ABNORMAL HIGH (ref 0.30–0.70)

## 2017-03-14 LAB — LIPID PANEL
CHOL/HDL RATIO: 4.2 ratio
Cholesterol: 181 mg/dL (ref 0–200)
HDL: 43 mg/dL (ref >40)
LDL Cholesterol: 120 mg/dL — ABNORMAL HIGH (ref 0–99)
Triglycerides: 90 mg/dL (ref <150)
VLDL: 18 mg/dL (ref 0–40)

## 2017-03-14 LAB — ECHOCARDIOGRAM COMPLETE
Height: 72 in
Weight: 2640 oz

## 2017-03-14 LAB — TROPONIN I
TROPONIN I: 0.16 ng/mL — AB (ref <0.03)
TROPONIN I: 0.23 ng/mL — AB (ref <0.03)
TROPONIN I: 0.37 ng/mL — AB (ref <0.03)
Troponin I: 0.12 ng/mL (ref <0.03)

## 2017-03-14 LAB — APTT: aPTT: 33 seconds (ref 24–36)

## 2017-03-14 LAB — LIPASE, BLOOD: LIPASE: 39 U/L (ref 11–51)

## 2017-03-14 LAB — PROTIME-INR
INR: 1
PROTHROMBIN TIME: 13.1 s (ref 11.4–15.2)

## 2017-03-14 MED ORDER — MORPHINE SULFATE (PF) 2 MG/ML IV SOLN: 2.0000 mg | INTRAVENOUS | Status: DC | PRN

## 2017-03-14 MED ORDER — PANTOPRAZOLE SODIUM 40 MG IV SOLR
40.0000 mg | INTRAVENOUS | Status: DC
  Administered 2017-03-14: 40 mg via INTRAVENOUS
  Filled 2017-03-14: qty 40

## 2017-03-14 MED ORDER — VITAMIN D 1000 UNITS PO TABS
1000.0000 [IU] | ORAL_TABLET | Freq: Every day | ORAL | Status: DC
  Filled 2017-03-14: qty 1

## 2017-03-14 MED ORDER — ONDANSETRON HCL 4 MG/2ML IJ SOLN
4.0000 mg | Freq: Once | INTRAMUSCULAR | Status: AC
  Administered 2017-03-14: 4 mg via INTRAVENOUS

## 2017-03-14 MED ORDER — ATORVASTATIN CALCIUM 20 MG PO TABS
40.0000 mg | ORAL_TABLET | Freq: Every day | ORAL | Status: DC
  Administered 2017-03-14 – 2017-03-15 (×2): 40 mg via ORAL
  Filled 2017-03-14 (×2): qty 2

## 2017-03-14 MED ORDER — SODIUM CHLORIDE 0.9 % WEIGHT BASED INFUSION
1.0000 mL/kg/h | INTRAVENOUS | Status: DC
  Administered 2017-03-15: 1 mL/kg/h via INTRAVENOUS

## 2017-03-14 MED ORDER — SODIUM CHLORIDE 0.9 % WEIGHT BASED INFUSION
3.0000 mL/kg/h | INTRAVENOUS | Status: AC
  Administered 2017-03-15: 3 mL/kg/h via INTRAVENOUS

## 2017-03-14 MED ORDER — GI COCKTAIL ~~LOC~~
30.0000 mL | Freq: Four times a day (QID) | ORAL | Status: DC | PRN
  Filled 2017-03-14: qty 30

## 2017-03-14 MED ORDER — COQ-10 200 MG PO CAPS: 200.0000 mg | ORAL_CAPSULE | Freq: Every day | ORAL | Status: DC

## 2017-03-14 MED ORDER — ACETAMINOPHEN 325 MG PO TABS: 650.0000 mg | ORAL_TABLET | ORAL | Status: DC | PRN

## 2017-03-14 MED ORDER — ASPIRIN 81 MG PO CHEW
81.0000 mg | CHEWABLE_TABLET | ORAL | Status: AC
  Administered 2017-03-15: 81 mg via ORAL
  Filled 2017-03-14: qty 1

## 2017-03-14 MED ORDER — FLUOXETINE HCL 10 MG PO CAPS
30.0000 mg | ORAL_CAPSULE | Freq: Every day | ORAL | Status: DC
  Administered 2017-03-14 – 2017-03-15 (×2): 30 mg via ORAL
  Filled 2017-03-14 (×3): qty 3

## 2017-03-14 MED ORDER — ONDANSETRON HCL 4 MG/2ML IJ SOLN: 4.0000 mg | Freq: Four times a day (QID) | INTRAMUSCULAR | Status: DC | PRN

## 2017-03-14 MED ORDER — GABAPENTIN 100 MG PO CAPS
100.0000 mg | ORAL_CAPSULE | Freq: Three times a day (TID) | ORAL | Status: DC
  Filled 2017-03-14 (×3): qty 1

## 2017-03-14 MED ORDER — METOPROLOL TARTRATE 25 MG PO TABS
12.5000 mg | ORAL_TABLET | Freq: Two times a day (BID) | ORAL | Status: DC
  Administered 2017-03-14 (×2): 12.5 mg via ORAL
  Filled 2017-03-14 (×3): qty 1

## 2017-03-14 MED ORDER — HEPARIN SODIUM (PORCINE) 5000 UNIT/ML IJ SOLN
4000.0000 [IU] | Freq: Once | INTRAMUSCULAR | Status: AC
  Administered 2017-03-14: 4000 [IU] via INTRAVENOUS
  Filled 2017-03-14: qty 1

## 2017-03-14 MED ORDER — SODIUM CHLORIDE 0.9% FLUSH
3.0000 mL | Freq: Two times a day (BID) | INTRAVENOUS | Status: DC
  Administered 2017-03-14: 3 mL via INTRAVENOUS

## 2017-03-14 MED ORDER — METRONIDAZOLE 1 % EX GEL: Freq: Every day | CUTANEOUS | Status: DC

## 2017-03-14 MED ORDER — SODIUM CHLORIDE 0.9% FLUSH: 3.0000 mL | INTRAVENOUS | Status: DC | PRN

## 2017-03-14 MED ORDER — HEPARIN (PORCINE) IN NACL 100-0.45 UNIT/ML-% IJ SOLN
850.0000 [IU]/h | INTRAMUSCULAR | Status: DC
  Administered 2017-03-14: 1000 [IU]/h via INTRAVENOUS
  Administered 2017-03-15: 850 [IU]/h via INTRAVENOUS
  Filled 2017-03-14 (×2): qty 250

## 2017-03-14 MED ORDER — PREDNISOLONE ACETATE 1 % OP SUSP
1.0000 [drp] | Freq: Every day | OPHTHALMIC | Status: DC
  Filled 2017-03-14: qty 1

## 2017-03-14 MED ORDER — ALPRAZOLAM 0.25 MG PO TABS: 0.2500 mg | ORAL_TABLET | Freq: Two times a day (BID) | ORAL | Status: DC | PRN

## 2017-03-14 MED ORDER — VITAMIN C 500 MG PO TABS
1000.0000 mg | ORAL_TABLET | Freq: Every day | ORAL | Status: DC
  Filled 2017-03-14 (×3): qty 2

## 2017-03-14 MED ORDER — HEPARIN BOLUS VIA INFUSION
4000.0000 [IU] | Freq: Once | INTRAVENOUS | Status: DC
  Filled 2017-03-14: qty 4000

## 2017-03-14 MED ORDER — DAPSONE 5 % EX GEL: Freq: Every day | CUTANEOUS | Status: DC

## 2017-03-14 MED ORDER — TAMSULOSIN HCL 0.4 MG PO CAPS
0.4000 mg | ORAL_CAPSULE | Freq: Every day | ORAL | Status: DC
  Administered 2017-03-14 – 2017-03-15 (×2): 0.4 mg via ORAL
  Filled 2017-03-14 (×3): qty 1

## 2017-03-14 MED ORDER — ONDANSETRON HCL 4 MG/2ML IJ SOLN
INTRAMUSCULAR | Status: AC
  Filled 2017-03-14: qty 2

## 2017-03-14 MED ORDER — ADULT MULTIVITAMIN W/MINERALS CH
1.0000 | ORAL_TABLET | Freq: Every day | ORAL | Status: DC
  Filled 2017-03-14: qty 1

## 2017-03-14 MED ORDER — NITROGLYCERIN 2 % TD OINT
1.0000 [in_us] | TOPICAL_OINTMENT | Freq: Four times a day (QID) | TRANSDERMAL | Status: DC
  Filled 2017-03-14 (×2): qty 1

## 2017-03-14 MED ORDER — NIACIN 500 MG PO TABS
500.0000 mg | ORAL_TABLET | Freq: Every day | ORAL | Status: DC
  Filled 2017-03-14 (×2): qty 1

## 2017-03-14 MED ORDER — OMEGA-3-ACID ETHYL ESTERS 1 G PO CAPS
1.0000 g | ORAL_CAPSULE | Freq: Every day | ORAL | Status: DC
  Filled 2017-03-14: qty 1

## 2017-03-14 MED ORDER — FINASTERIDE 5 MG PO TABS
5.0000 mg | ORAL_TABLET | Freq: Every day | ORAL | Status: DC
  Administered 2017-03-14 – 2017-03-15 (×2): 5 mg via ORAL
  Filled 2017-03-14 (×2): qty 1

## 2017-03-14 MED ORDER — SODIUM CHLORIDE 0.9 % IV SOLN: 250.0000 mL | INTRAVENOUS | Status: DC | PRN

## 2017-03-14 NOTE — ED Triage Notes (Signed)
Patient presents to Emergency Department via [facility] from home with complaints of chest pain 8/10 "heaviness" radiation right jaw and left arm.  Pt took 1 nitro at 0100  EMS gave 4mg  zofran IV, 324 ASA PO, and 1 nitro SL  Pain resolved to 2/10

## 2017-03-14 NOTE — Progress Notes (Signed)
[facility] for heparin Indication: chest pain/ACS  Allergies  Allergen Reactions  . Ciprofloxacin   . Clarithromycin   . Oxycodone   . Penicillins   . Sulfur     Patient Measurements: Height: 6' (182.9 cm) Weight: 165 lb (74.8 kg) IBW/kg (Calculated) : 77.6 Heparin Dosing Weight: 74.8 kg   Vital Signs: Temp: 97.7 F (36.5 C) (02/18 0851) Temp Source: Oral (02/18 0851) BP: 146/75 (02/18 0851) Pulse Rate: 67 (02/18 0851)  Labs: Recent Labs    03/14/17 0523 03/14/17 0806 03/14/17 1057 03/14/17 1437  HGB 14.5  --   --   --   HCT 42.8  --   --   --   PLT 143*  --   --   --   APTT 33  --   --   --   LABPROT 13.1  --   --   --   INR 1.00  --   --   --   HEPARINUNFRC  --   --   --  0.56  CREATININE 0.92  --   --   --   TROPONINI 0.12* 0.16* 0.23*  --     Estimated Creatinine Clearance: 65.5 mL/min (by C-G formula based on SCr of 0.92 mg/dL).   Medical History: Past Medical History:  Diagnosis Date  . Allergy   . Anxiety 06/17/2014  . Benign prostatic hyperplasia with urinary obstruction 06/17/2014  . Benign prostatic hypertrophy without urinary obstruction 06/20/2013  . Bladder calculi 04/26/2013  . Calculi, ureter 03/29/2013  . Calculus of kidney 02/25/2013  . Cataract   . Clinical depression 06/17/2014  . Degenerative disc disease, lumbar   . Frank hematuria 06/28/2013  . HLD (hyperlipidemia) 06/17/2014  . Spondylolysis     Medications:  Scheduled:  . [START ON 03/15/2017] aspirin  81 mg Oral Pre-Cath  . atorvastatin  40 mg Oral q1800  . cholecalciferol  1,000 Units Oral Daily  . Dapsone   Topical Daily  . finasteride  5 mg Oral Daily  . FLUoxetine  30 mg Oral Daily  . gabapentin  100 mg Oral TID  . metoprolol tartrate  12.5 mg Oral BID  . metroNIDAZOLE   Topical Daily  . multivitamin with minerals  1 tablet Oral Daily  . niacin  500 mg Oral QHS  . nitroGLYCERIN  1 inch Topical Q6H  . omega-3 acid ethyl esters  1 g Oral  Daily  . pantoprazole (PROTONIX) IV  40 mg Intravenous Q24H  . prednisoLONE acetate  1 drop Right Eye Daily  . sodium chloride flush  3 mL Intravenous Q12H  . tamsulosin  0.4 mg Oral Daily  . vitamin C  1,000 mg Oral Daily    Assessment: Patient admitted CP w/ trops 0.12, being started on heparin.  Goal of Therapy:  Heparin level 0.3-0.7 units/ml Monitor platelets by anticoagulation protocol: Yes   Plan:  Will bolus heparin 4000 units IV x 1 Will start heparin drip @ 1000 units/hr Will check HL @ 1500 Baseline labs drawn Will monitor daily CBC's and adjust per HL's  02/18 @ 1515 HL 0.56. Will continue heparin infusion at 1000 units/hr and recheck a HL in 8 hours.   Ulice Dash, PharmD Clinical Pharmacist 03/14/2017

## 2017-03-14 NOTE — H&P (Signed)
[facility] [place] NAME: Todd Bennett    MR#:  [id number]  DATE OF BIRTH:  1932/05/30  DATE OF ADMISSION:  03/14/2017  PRIMARY CARE PHYSICIAN: [name], MD   REQUESTING/REFERRING PHYSICIAN:   CHIEF COMPLAINT:   Chief Complaint  Patient presents with  . Chest Pain    HISTORY OF PRESENT ILLNESS: Todd Bennett  is a 84 y.o. male with a known history per below presenting to the emergency room with acute mid chest pain radiating down both arms and jaw associated with nausea, occurred before midnight, patient took his wife's nitroglycerin which alleviated the pain, but the pain recurred earlier this morning and again it was alleviated with nitroglycerin, in the emergency room patient was found to have mild bradycardia with heart rate in the 40s-50s, troponin 0.12, EKG noted for lateral T wave inversions, patient evaluated with family at the bedside, patient is now being admitted for acute probable non-STEMI  PAST MEDICAL HISTORY:   Past Medical History:  Diagnosis Date  . Allergy   . Anxiety 06/17/2014  . Benign prostatic hyperplasia with urinary obstruction 06/17/2014  . Benign prostatic hypertrophy without urinary obstruction 06/20/2013  . Bladder calculi 04/26/2013  . Calculi, ureter 03/29/2013  . Calculus of kidney 02/25/2013  . Cataract   . Clinical depression 06/17/2014  . Degenerative disc disease, lumbar   . Frank hematuria 06/28/2013  . HLD (hyperlipidemia) 06/17/2014  . Spondylolysis     PAST SURGICAL HISTORY:  Past Surgical History:  Procedure Laterality Date  . HERNIA REPAIR  1993  . ILEOSTOMY    . LITHOTRIPSY  03/2013    SOCIAL HISTORY:  Social History   Tobacco Use  . Smoking status: Former Research scientist (life sciences)  . Smokeless tobacco: Never Used  Substance Use Topics  . Alcohol use: No    Alcohol/week: 0.0 oz    FAMILY HISTORY:  Family History  Problem Relation Age of Onset  . Diabetes Mother   . Prostate cancer Neg Hx   . Bladder  Cancer Neg Hx     DRUG ALLERGIES:  Allergies  Allergen Reactions  . Ciprofloxacin   . Clarithromycin   . Oxycodone   . Penicillins   . Sulfur     REVIEW OF SYSTEMS:   CONSTITUTIONAL: No fever, fatigue or weakness.  EYES: No blurred or double vision.  EARS, NOSE, AND THROAT: No tinnitus or ear pain.  RESPIRATORY: No cough, shortness of breath, wheezing or hemoptysis.  CARDIOVASCULAR: + chest pain, no orthopnea, edema.  GASTROINTESTINAL: + nausea, no vomiting, diarrhea or abdominal pain.  GENITOURINARY: No dysuria, hematuria.  ENDOCRINE: No polyuria, nocturia,  HEMATOLOGY: No anemia, easy bruising or bleeding SKIN: No rash or lesion. MUSCULOSKELETAL: No joint pain or arthritis.   NEUROLOGIC: No tingling, numbness, weakness.  PSYCHIATRY: No anxiety or depression.   MEDICATIONS AT HOME:  Prior to Admission medications   Medication Sig Start Date End Date Taking? Authorizing Provider  Ascorbic Acid (VITAMIN C) 1000 MG tablet Take by mouth.    [provider]  aspirin EC 81 MG tablet Take by mouth.    [provider]  Cholecalciferol (VITAMIN D-1000 MAX ST) 1000 units tablet Take by mouth.    [provider]  Coenzyme Q10 (COQ-10) 200 MG CAPS Take 200 mg by mouth.    [provider]  Dapsone (ACZONE) 5 % topical gel  04/18/13   [provider]  diazepam (VALIUM) 5 MG tablet Take 5 mg by mouth.  [provider]  finasteride (PROSCAR) 5 MG tablet TAKE 1 TABLET EVERY DAY 01/15/17   McGowan, Larene Beach A, PA-C  FLUoxetine (PROZAC) 20 MG capsule Take 30 mg by mouth daily.     [provider]  gabapentin (NEURONTIN) 100 MG capsule Take 1 capsule (100 mg total) by mouth 3 (three) times daily. 02/28/17   Edrick Kins, DPM  meloxicam (MOBIC) 15 MG tablet Take by mouth.    [provider]  metroNIDAZOLE (METROGEL) 1 % gel Apply topically.    [provider]  Multiple Vitamin (MULTI-VITAMINS) TABS Take by mouth  daily.     [provider]  niacin 500 MG tablet Take 500 mg by mouth.    [provider]  NONFORMULARY OR COMPOUNDED ITEM Apply 1-2 g topically 4 (four) times daily. Patient taking differently: Apply 1-2 g topically as needed.  10/24/15   Edrick Kins, DPM  Omega-3 Fatty Acids (FISH OIL) 1000 MG CAPS Take by mouth daily.     [provider]  prednisoLONE acetate (PRED FORTE) 1 % ophthalmic suspension Place 1 drop into the right eye daily.    [provider]  predniSONE (DELTASONE) 10 MG tablet Take 10 mg by mouth daily with breakfast.  08/05/16   [provider]  [facility] injection  08/23/16   [provider]  tamsulosin (FLOMAX) 0.4 MG CAPS capsule Take 0.4 mg by mouth daily.     [provider]  venlafaxine XR (EFFEXOR-XR) 150 MG 24 hr capsule Take 150 mg by mouth daily with breakfast.     [provider]      PHYSICAL EXAMINATION:   VITAL SIGNS: Blood pressure 126/60, pulse (!) 54, temperature 97.7 F (36.5 C), temperature source Oral, resp. rate 14, height 6' (1.829 m), weight 74.8 kg (165 lb), SpO2 98 %.  GENERAL:  84 y.o.-year-old patient lying in the bed with no acute distress.  Frail-appearing EYES: Pupils equal, round, reactive to light and accommodation. No scleral icterus. Extraocular muscles intact.  HEENT: Head atraumatic, normocephalic. Oropharynx and nasopharynx clear.  NECK:  Supple, no jugular venous distention. No thyroid enlargement, no tenderness.  LUNGS: Normal breath sounds bilaterally, no wheezing, rales,rhonchi or crepitation. No use of accessory muscles of respiration.  CARDIOVASCULAR: S1, S2 normal. No murmurs, rubs, or gallops.  ABDOMEN: Soft, nontender, nondistended. Bowel sounds present. No organomegaly or mass.  EXTREMITIES: No pedal edema, cyanosis, or clubbing.  NEUROLOGIC: Cranial nerves II through XII are intact. MAES. Gait not checked.  PSYCHIATRIC: The patient is alert and oriented x  3.  SKIN: No obvious rash, lesion, or ulcer.   LABORATORY PANEL:   CBC Recent Labs  Lab 03/14/17 0523  WBC 6.9  HGB 14.5  HCT 42.8  PLT 143*  MCV 88.7  MCH 30.1  MCHC 33.9  RDW 13.3   ------------------------------------------------------------------------------------------------------------------  Chemistries  Recent Labs  Lab 03/14/17 0523  NA 140  K 4.0  CL 104  CO2 29  GLUCOSE 114*  BUN 16  CREATININE 0.92  CALCIUM 9.3  AST 25  ALT 23  ALKPHOS 52  BILITOT 0.9   ------------------------------------------------------------------------------------------------------------------ estimated creatinine clearance is 65.5 mL/min (by C-G formula based on SCr of 0.92 mg/dL). ------------------------------------------------------------------------------------------------------------------ No results for input(s): TSH, T4TOTAL, T3FREE, THYROIDAB in the last 72 hours.  Invalid input(s): FREET3   Coagulation profile Recent Labs  Lab 03/14/17 0523  INR 1.00   ------------------------------------------------------------------------------------------------------------------- No results for input(s): DDIMER in the last 72 hours. -------------------------------------------------------------------------------------------------------------------  Cardiac Enzymes Recent  Labs  Lab 03/14/17 0523  TROPONINI 0.12*   ------------------------------------------------------------------------------------------------------------------ Invalid input(s): POCBNP  ---------------------------------------------------------------------------------------------------------------  Urinalysis    Component Value Date/Time   COLORURINE RED 02/20/2013 0949   APPEARANCEUR Clear 04/02/2015 0850   LABSPEC 1.018 02/20/2013 0949   PHURINE see comment 02/20/2013 0949   GLUCOSEU Negative 04/02/2015 0850   GLUCOSEU see comment 02/20/2013 0949   HGBUR see comment 02/20/2013 0949   BILIRUBINUR  Negative 04/02/2015 0850   BILIRUBINUR see comment 02/20/2013 0949   KETONESUR see comment 02/20/2013 0949   PROTEINUR Negative 04/02/2015 0850   PROTEINUR see comment 02/20/2013 0949   NITRITE Negative 04/02/2015 0850   NITRITE SEE COMMENT 02/20/2013 0949   LEUKOCYTESUR Negative 04/02/2015 0850   LEUKOCYTESUR see comment 02/20/2013 0949     RADIOLOGY: No results found.  EKG: Orders placed or performed during the hospital encounter of 03/14/17  . ED EKG  . ED EKG  . EKG 12-Lead  . EKG 12-Lead    IMPRESSION AND PLAN: 1 acute non-STEMI Referred to the observation unit on our ACS protocol, heparin drip, aspirin, Lopressor with hold parameters, statin therapy, check lipids, nitrates as needed, morphine IV as needed breakthrough pain, supplemental oxygen as needed, check echocardiogram, cardiology to see, n.p.o. except for meds  2 acute mild asymptomatic bradycardia Stable Possibly related to above Conservative observation for now and all other plans as stated above  3 chronic BPH Stable Continue Flomax  4 chronic hyperlipidemia, unspecified Statin therapy and check lipids  5 history of tobacco smoking abuse Patient congratulated on quitting   All the records are reviewed and case discussed with ED provider. Management plans discussed with the patient, family and they are in agreement.  CODE STATUS:DNR Code Status History    This patient does not have a recorded code status. Please follow your organizational policy for patients in this situation.    Advance Directive Documentation     Most Recent Value  Type of Advance Directive  Out of facility DNR (pink MOST or yellow form)  Pre-existing out of facility DNR order (yellow form or pink MOST form)  Physician notified to receive inpatient order, Yellow form placed in chart (order not valid for inpatient use)  "MOST" Form in Place?  No data       TOTAL TIME TAKING CARE OF THIS PATIENT: 45 minutes.    Todd Bennett  Todd Bennett M.D on 03/14/2017   Between 7am to 6pm - Pager - [phone]  After 6pm go to [email or web address] - password [email or web address] [facility]  Office  [phone]  CC: Primary care physician; [name], MD   Note: This dictation was prepared with Dragon dictation along with smaller phrase technology. Any transcriptional errors that result from this process are unintentional.

## 2017-03-14 NOTE — Plan of Care (Signed)
Pt admitted this shift from ED . A&Ox4. VSS . RA . OOB with standby assist. Wife at bedside. NSR on monitor. Heparin gtt infusing per order. Ileostomy remains intact. Pt to go for cardiac cath tomorrow. Will continue to monitor and report to oncoming RN .  Progressing Education: Knowledge of General Education information will improve 03/14/2017 1459 - Progressing by Aleen Campi, RN Health Behavior/Discharge Planning: Ability to manage health-related needs will improve 03/14/2017 1459 - Progressing by Aleen Campi, RN Clinical Measurements: Ability to maintain clinical measurements within normal limits will improve 03/14/2017 1459 - Progressing by Aleen Campi, RN Will remain free from infection 03/14/2017 1459 - Progressing by Aleen Campi, RN Diagnostic test results will improve 03/14/2017 1459 - Progressing by Aleen Campi, RN Respiratory complications will improve 03/14/2017 1459 - Progressing by Aleen Campi, RN Cardiovascular complication will be avoided 03/14/2017 1459 - Progressing by Aleen Campi, RN Activity: Risk for activity intolerance will decrease 03/14/2017 1459 - Progressing by Aleen Campi, RN Nutrition: Adequate nutrition will be maintained 03/14/2017 1459 - Progressing by Aleen Campi, RN Coping: Level of anxiety will decrease 03/14/2017 1459 - Progressing by Aleen Campi, RN Elimination: Will not experience complications related to bowel motility 03/14/2017 1459 - Progressing by Aleen Campi, RN Will not experience complications related to urinary retention 03/14/2017 1459 - Progressing by Aleen Campi, RN Pain Managment: General experience of comfort will improve 03/14/2017 1459 - Progressing by Aleen Campi, RN Safety: Ability to remain free from injury will improve 03/14/2017 1459 - Progressing by Aleen Campi, RN Skin Integrity: Risk for impaired skin integrity will decrease 03/14/2017 1459 - Progressing by Aleen Campi, RN

## 2017-03-14 NOTE — Progress Notes (Signed)
Admitted this morning for chest pain, found to have a non-ST elevation MI.  Heparin drip at this time.  Patient denies any further chest pain. , Medications reviewed. NSTEMI;continue bblocker, heparin drip, scheduled for cardiac cath tomorrow.  Continue nitrates. Time spent is 20 minutes.

## 2017-03-14 NOTE — Progress Notes (Signed)
[place] for heparin Indication: chest pain/ACS  Allergies  Allergen Reactions  . Ciprofloxacin   . Clarithromycin   . Oxycodone   . Penicillins   . Sulfur     Patient Measurements: Height: 6' (182.9 cm) Weight: 165 lb (74.8 kg) IBW/kg (Calculated) : 77.6 Heparin Dosing Weight: 74.8 kg   Vital Signs: Temp: 98.6 F (37 C) (02/18 2008) Temp Source: Oral (02/18 2008) BP: 152/64 (02/18 2008) Pulse Rate: 60 (02/18 2008)  Labs: Recent Labs    03/14/17 0523 03/14/17 0806 03/14/17 1057 03/14/17 1437 03/14/17 2255  HGB 14.5  --   --   --   --   HCT 42.8  --   --   --   --   PLT 143*  --   --   --   --   APTT 33  --   --   --   --   LABPROT 13.1  --   --   --   --   INR 1.00  --   --   --   --   HEPARINUNFRC  --   --   --  0.56 0.89*  CREATININE 0.92  --   --   --   --   TROPONINI 0.12* 0.16* 0.23* 0.37*  --     Estimated Creatinine Clearance: 65.5 mL/min (by C-G formula based on SCr of 0.92 mg/dL).   Medical History: Past Medical History:  Diagnosis Date  . Allergy   . Anxiety 06/17/2014  . Benign prostatic hyperplasia with urinary obstruction 06/17/2014  . Benign prostatic hypertrophy without urinary obstruction 06/20/2013  . Bladder calculi 04/26/2013  . Calculi, ureter 03/29/2013  . Calculus of kidney 02/25/2013  . Cataract   . Clinical depression 06/17/2014  . Degenerative disc disease, lumbar   . Frank hematuria 06/28/2013  . HLD (hyperlipidemia) 06/17/2014  . Spondylolysis     Medications:  Scheduled:  . [START ON 03/15/2017] aspirin  81 mg Oral Pre-Cath  . atorvastatin  40 mg Oral q1800  . cholecalciferol  1,000 Units Oral Daily  . Dapsone   Topical Daily  . finasteride  5 mg Oral Daily  . FLUoxetine  30 mg Oral Daily  . gabapentin  100 mg Oral TID  . metoprolol tartrate  12.5 mg Oral BID  . metroNIDAZOLE   Topical Daily  . multivitamin with minerals  1 tablet Oral Daily  . niacin  500 mg Oral QHS  . nitroGLYCERIN   1 inch Topical Q6H  . omega-3 acid ethyl esters  1 g Oral Daily  . pantoprazole (PROTONIX) IV  40 mg Intravenous Q24H  . prednisoLONE acetate  1 drop Right Eye Daily  . sodium chloride flush  3 mL Intravenous Q12H  . tamsulosin  0.4 mg Oral Daily  . vitamin C  1,000 mg Oral Daily    Assessment: Patient admitted CP w/ trops 0.12, being started on heparin.  Goal of Therapy:  Heparin level 0.3-0.7 units/ml Monitor platelets by anticoagulation protocol: Yes   Plan:  Will bolus heparin 4000 units IV x 1 Will start heparin drip @ 1000 units/hr Will check HL @ 1500 Baseline labs drawn Will monitor daily CBC's and adjust per HL's  02/18 @ 1515 HL 0.56. Will continue heparin infusion at 1000 units/hr and recheck a HL in 8 hours.    2/18 2300 heparin level 0.89. Decrease to 850 units/hr and recheck in 8 hours. CBC in AM.  Sim Boast, PharmD,  BCPS  03/14/17 11:52 PM

## 2017-03-14 NOTE — ED Notes (Signed)
Admitting Provider at bedside. 

## 2017-03-14 NOTE — Consult Note (Signed)
Cardiology Consultation Note    Patient ID: Todd Bennett, MRN: [id number], DOB/AGE: 1932-06-05 84 y.o. Admit date: 03/14/2017   Date of Consult: 03/14/2017 Primary Physician: [name], MD Primary Cardiologist:    Chief Complaint: Chest pain Reason for Consultation: Non-ST elevation myocardial infarction Requesting MD: Dr. Vianne Bulls  HPI: Todd Bennett is a 84 y.o. male with history of no prior cardiac history with mild hyperlipidemia who was admitted with several hours of intermittent midsternal chest pain with radiation to both arms and into his back.  Patient has no prior cardiac history.  He has been fairly active without difficulty.  Last p.m. he developed an episode of bilateral chest pain with some radiation to his arms.  He took 1 of his night wife's nitroglycerin tablets with improvement.  Pain reoccurred overnight requiring another sublingual nitroglycerin.  When it recurred the third time he presented to the emergency room where electrocardiogram showed nonspecific ST-T wave changes.  His initial serum troponin was 0.12.  Subsequent values were 0.16 and 0.23.  He currently is pain-free other than some mild discomfort in his right lateral chest.  He is hemodynamically stable.  He is currently on IV heparin, aspirin and high intensity statin.  He has relative bradycardia limiting higher dose beta-blocker use.  Past Medical History:  Diagnosis Date  . Allergy   . Anxiety 06/17/2014  . Benign prostatic hyperplasia with urinary obstruction 06/17/2014  . Benign prostatic hypertrophy without urinary obstruction 06/20/2013  . Bladder calculi 04/26/2013  . Calculi, ureter 03/29/2013  . Calculus of kidney 02/25/2013  . Cataract   . Clinical depression 06/17/2014  . Degenerative disc disease, lumbar   . Frank hematuria 06/28/2013  . HLD (hyperlipidemia) 06/17/2014  . Spondylolysis       Surgical History:  Past Surgical History:  Procedure Laterality Date  . HERNIA REPAIR  1993  . ILEOSTOMY     . LITHOTRIPSY  03/2013     Home Meds: Prior to Admission medications   Medication Sig Start Date End Date Taking? Authorizing Provider  Ascorbic Acid (VITAMIN C) 1000 MG tablet Take by mouth.   Yes [provider]  aspirin EC 81 MG tablet Take by mouth.   Yes [provider]  Cholecalciferol (VITAMIN D-1000 MAX ST) 1000 units tablet Take by mouth.   Yes [provider]  finasteride (PROSCAR) 5 MG tablet TAKE 1 TABLET EVERY DAY 01/15/17  Yes McGowan, Larene Beach A, PA-C  FLUoxetine (PROZAC) 20 MG capsule Take 30 mg by mouth daily.    Yes [provider]  metroNIDAZOLE (METROGEL) 1 % gel Apply topically.   Yes [provider]  Multiple Vitamin (MULTI-VITAMINS) TABS Take by mouth daily.    Yes [provider]  Omega-3 Fatty Acids (FISH OIL) 1000 MG CAPS Take by mouth daily.    Yes [provider]  prednisoLONE acetate (PRED FORTE) 1 % ophthalmic suspension Place 1 drop into the right eye daily.   Yes [provider]  tamsulosin (FLOMAX) 0.4 MG CAPS capsule Take 0.4 mg by mouth daily.    Yes [provider]  Venlafaxine HCl 75 MG TB24 Take 75 mg by mouth daily with breakfast.    Yes [provider]  Coenzyme Q10 (COQ-10) 200 MG CAPS Take 200 mg by mouth.    [provider]  Dapsone (ACZONE) 5 % topical gel  04/18/13   [provider]  diazepam (VALIUM) 5 MG tablet Take 5 mg by mouth.  [provider]  gabapentin (NEURONTIN) 100 MG capsule Take 1 capsule (100 mg total) by mouth 3 (three) times daily. Patient not taking: Reported on 03/14/2017 02/28/17   Edrick Kins, DPM  meloxicam (MOBIC) 15 MG tablet Take by mouth.    [provider]  niacin 500 MG tablet Take 500 mg by mouth.    [provider]  NONFORMULARY OR COMPOUNDED ITEM Apply 1-2 g topically 4 (four) times daily. Patient not taking: Reported on 03/14/2017 10/24/15   Edrick Kins, DPM  predniSONE  (DELTASONE) 10 MG tablet Take 10 mg by mouth daily with breakfast.  08/05/16   [provider]  [facility] injection  08/23/16   [provider]    Inpatient Medications:  . atorvastatin  40 mg Oral q1800  . cholecalciferol  1,000 Units Oral Daily  . Dapsone   Topical Daily  . finasteride  5 mg Oral Daily  . FLUoxetine  30 mg Oral Daily  . gabapentin  100 mg Oral TID  . metoprolol tartrate  12.5 mg Oral BID  . metroNIDAZOLE   Topical Daily  . multivitamin with minerals  1 tablet Oral Daily  . niacin  500 mg Oral QHS  . nitroGLYCERIN  1 inch Topical Q6H  . omega-3 acid ethyl esters  1 g Oral Daily  . pantoprazole (PROTONIX) IV  40 mg Intravenous Q24H  . prednisoLONE acetate  1 drop Right Eye Daily  . tamsulosin  0.4 mg Oral Daily  . vitamin C  1,000 mg Oral Daily   . heparin 1,000 Units/hr (03/14/17 2952)    Allergies:  Allergies  Allergen Reactions  . Ciprofloxacin   . Clarithromycin   . Oxycodone   . Penicillins   . Sulfur     Social History   Socioeconomic History  . Marital status: Married    Spouse name: Not on file  . Number of children: Not on file  . Years of education: Not on file  . Highest education level: Not on file  Social Needs  . Financial resource strain: Not on file  . Food insecurity - worry: Not on file  . Food insecurity - inability: Not on file  . Transportation needs - medical: Not on file  . Transportation needs - non-medical: Not on file  Occupational History  . Not on file  Tobacco Use  . Smoking status: Former Research scientist (life sciences)  . Smokeless tobacco: Never Used  Substance and Sexual Activity  . Alcohol use: No    Alcohol/week: 0.0 oz  . Drug use: No  . Sexual activity: Not on file  Other Topics Concern  . Not on file  Social History Narrative  . Not on file     Family History  Problem Relation Age of Onset  . Diabetes Mother   . Prostate cancer Neg Hx   . Bladder Cancer Neg Hx      Review of Systems: A 12-system  review of systems was performed and is negative except as noted in the HPI.  Labs: Recent Labs    03/14/17 0523 03/14/17 0806 03/14/17 1057  TROPONINI 0.12* 0.16* 0.23*   Lab Results  Component Value Date   WBC 6.9 03/14/2017   HGB 14.5 03/14/2017   HCT 42.8 03/14/2017   MCV 88.7 03/14/2017   PLT 143 (L) 03/14/2017    Recent Labs  Lab 03/14/17 0523  NA 140  K 4.0  CL 104  CO2 29  BUN 16  CREATININE 0.92  CALCIUM  9.3  PROT 6.3*  BILITOT 0.9  ALKPHOS 52  ALT 23  AST 25  GLUCOSE 114*   Lab Results  Component Value Date   CHOL 181 03/14/2017   HDL 43 03/14/2017   LDLCALC 120 (H) 03/14/2017   TRIG 90 03/14/2017   No results found for: DDIMER  Radiology/Studies:  No results found.  Wt Readings from Last 3 Encounters:  03/14/17 74.8 kg (165 lb)  01/13/17 74.8 kg (165 lb)  12/01/16 74.8 kg (165 lb)    EKG: Normal sinus rhythm with nonspecific ST-T wave changes  Physical Exam: Blood pressure (!) 146/75, pulse 67, temperature 97.7 F (36.5 C), temperature source Oral, resp. rate 17, height 6' (1.829 m), weight 74.8 kg (165 lb), SpO2 98 %. Body mass index is 22.38 kg/m. General: Well developed, well nourished, in no acute distress. Head: Normocephalic, atraumatic, sclera non-icteric, no xanthomas, nares are without discharge.  Neck: Negative for carotid bruits. JVD not elevated. Lungs: Clear bilaterally to auscultation without wheezes, rales, or rhonchi. Breathing is unlabored. Heart: RRR with S1 S2. No murmurs, rubs, or gallops appreciated. Abdomen: Soft, non-tender, non-distended with normoactive bowel sounds. No hepatomegaly. No rebound/guarding. No obvious abdominal masses. Msk:  Strength and tone appear normal for age. Extremities: No clubbing or cyanosis. No edema.  Distal pedal pulses are 2+ and equal bilaterally. Neuro: Alert and oriented X 3. No facial asymmetry. No focal deficit. Moves all extremities spontaneously. Psych:  Responds to questions  appropriately with a normal affect.     Assessment and Plan  84 year old male with no prior cardiac history presents with chest pain typical for angina.  He has ruled in for non-ST elevation myocardial infarction.  He is currently stable on heparin, aspirin, low-dose beta blockers.  He is relatively bradycardic limiting further use of beta-blockers.  He is hemodynamically stable.  Symptoms have improved.  His serum troponin has peaked at 0.23.  Risk and benefits of left heart catheter explained to him and his wife.  Plan to pursue left heart cath tomorrow morning to evaluate coronary anatomy and guide further therapy.  Will add nitrates at 1 inch every 6 and continue with low-dose beta-blocker, aspirin and high intensity statin along with heparin.  The recommendations after left heart cath.  Signed, Teodoro Spray MD 03/14/2017, 12:59 PM Pager: [phone]

## 2017-03-14 NOTE — ED Provider Notes (Signed)
[facility] Emergency Department Provider Note  ____________________________________________   First MD Initiated Contact with Patient 03/14/17 0522     (approximate)  I have reviewed the triage vital signs and the nursing notes.   HISTORY  Chief Complaint Chest Pain    HPI Todd Bennett is a 84 y.o. male with medical history as listed below but with no known history of coronary artery disease who presents by EMS for evaluation of acute onset chest pain.  He reports that he does not deal with chest pain regularly and does not have a cardiologist.  He was lying in bed reading when he had acute onset severe pain, or "heaviness", in the center of his chest radiating down both arms and into the right side of his jaw.  He took one of his wife's nitroglycerin tablets and the pain went away and he was able to go to sleep.  A few hours later he awoke to a recurrence of the pain which he once again describes as severe.  It is accompanied this time with nausea.  He denies shortness of breath, fever/chills, abdominal pain, and dysuria.  At this time EMS was called and they provided a full dose aspirin and another sublingual nitroglycerin, after which time his symptoms again resolved.  He denies any chest pressure at this time.  He has not been ill recently and denies any upper respiratory symptoms.  He denies vomiting and diarrhea and has had no lower abdominal pain.  He has a history of smoking in the past and has hyperlipidemia.  He takes a daily baby aspirin.  He has no history of hypertension.  Past Medical History:  Diagnosis Date  . Allergy   . Anxiety 06/17/2014  . Benign prostatic hyperplasia with urinary obstruction 06/17/2014  . Benign prostatic hypertrophy without urinary obstruction 06/20/2013  . Bladder calculi 04/26/2013  . Calculi, ureter 03/29/2013  . Calculus of kidney 02/25/2013  . Cataract   . Clinical depression 06/17/2014  . Degenerative disc disease, lumbar     . Frank hematuria 06/28/2013  . HLD (hyperlipidemia) 06/17/2014  . Spondylolysis     Patient Active Problem List   Diagnosis Date Noted  . Anxiety 06/17/2014  . Benign prostatic hyperplasia with urinary obstruction 06/17/2014  . Clinical depression 06/17/2014  . HLD (hyperlipidemia) 06/17/2014  . Frank hematuria 06/28/2013  . Benign prostatic hypertrophy without urinary obstruction 06/20/2013  . Bladder calculi 04/26/2013  . Calculi, ureter 03/29/2013  . Calculus of kidney 02/25/2013    Past Surgical History:  Procedure Laterality Date  . HERNIA REPAIR  1993  . ILEOSTOMY    . LITHOTRIPSY  03/2013    Prior to Admission medications   Medication Sig Start Date End Date Taking? Authorizing Provider  Ascorbic Acid (VITAMIN C) 1000 MG tablet Take by mouth.    [provider]  aspirin EC 81 MG tablet Take by mouth.    [provider]  Cholecalciferol (VITAMIN D-1000 MAX ST) 1000 units tablet Take by mouth.    [provider]  Coenzyme Q10 (COQ-10) 200 MG CAPS Take 200 mg by mouth.    [provider]  Dapsone (ACZONE) 5 % topical gel  04/18/13   [provider]  diazepam (VALIUM) 5 MG tablet Take 5 mg by mouth.    [provider]  finasteride (PROSCAR) 5 MG tablet TAKE 1 TABLET EVERY DAY 01/15/17   McGowan, Larene Beach A, PA-C  FLUoxetine (PROZAC) 20 MG capsule Take 30 mg  by mouth daily.     [provider]  gabapentin (NEURONTIN) 100 MG capsule Take 1 capsule (100 mg total) by mouth 3 (three) times daily. 02/28/17   Edrick Kins, DPM  meloxicam (MOBIC) 15 MG tablet Take by mouth.    [provider]  metroNIDAZOLE (METROGEL) 1 % gel Apply topically.    [provider]  Multiple Vitamin (MULTI-VITAMINS) TABS Take by mouth daily.     [provider]  niacin 500 MG tablet Take 500 mg by mouth.    [provider]  NONFORMULARY OR COMPOUNDED ITEM Apply 1-2 g topically 4 (four) times daily. Patient  taking differently: Apply 1-2 g topically as needed.  10/24/15   Edrick Kins, DPM  Omega-3 Fatty Acids (FISH OIL) 1000 MG CAPS Take by mouth daily.     [provider]  prednisoLONE acetate (PRED FORTE) 1 % ophthalmic suspension Place 1 drop into the right eye daily.    [provider]  predniSONE (DELTASONE) 10 MG tablet Take 10 mg by mouth daily with breakfast.  08/05/16   [provider]  [facility] injection  08/23/16   [provider]  tamsulosin (FLOMAX) 0.4 MG CAPS capsule Take 0.4 mg by mouth daily.     [provider]  venlafaxine XR (EFFEXOR-XR) 150 MG 24 hr capsule Take 150 mg by mouth daily with breakfast.     [provider]    Allergies Ciprofloxacin; Clarithromycin; Oxycodone; Penicillins; and Sulfur  Family History  Problem Relation Age of Onset  . Diabetes Mother   . Prostate cancer Neg Hx   . Bladder Cancer Neg Hx     Social History Social History   Tobacco Use  . Smoking status: Former Research scientist (life sciences)  . Smokeless tobacco: Never Used  Substance Use Topics  . Alcohol use: No    Alcohol/week: 0.0 oz  . Drug use: No    Review of Systems Constitutional: No fever/chills Eyes: No visual changes. ENT: No sore throat. Cardiovascular: chest pain as described above Respiratory: Denies shortness of breath. Gastrointestinal: No abdominal pain.  No nausea, no vomiting.  No diarrhea.  No constipation. Genitourinary: Negative for dysuria. Musculoskeletal: Negative for neck pain.  Negative for back pain. Integumentary: Negative for rash. Neurological: Negative for headaches, focal weakness or numbness.   ____________________________________________   PHYSICAL EXAM:  VITAL SIGNS: ED Triage Vitals  Enc Vitals Group     BP 03/14/17 0533 113/68     Pulse Rate 03/14/17 0533 (!) 49     Resp 03/14/17 0533 19     Temp 03/14/17 0533 97.7 F (36.5 C)     Temp Source 03/14/17 0533 Oral     SpO2 03/14/17 0519 94 %     Weight  03/14/17 0533 74.8 kg (165 lb)     Height 03/14/17 0533 1.829 m (6')     Head Circumference --      Peak Flow --      Pain Score 03/14/17 0533 2     Pain Loc --      Pain Edu? --      Excl. in [place]? --     Constitutional: Alert and oriented. Well appearing and in no acute distress. Eyes: Conjunctivae are normal.  Head: Atraumatic. Nose: No congestion/rhinnorhea. Mouth/Throat: Mucous membranes are moist. Neck: No stridor.  No meningeal signs.   Cardiovascular: Normal rate, regular rhythm. Good peripheral circulation. Grossly normal heart sounds.  No reproducible chest wall tenderness. Respiratory: Normal respiratory effort.  No retractions. Lungs CTAB. Gastrointestinal: Soft and nontender. No distention.  Musculoskeletal: No lower extremity tenderness nor edema. No gross deformities of extremities. Neurologic:  Normal speech and language. No gross focal neurologic deficits are appreciated.  Skin:  Skin is warm, dry and intact. No rash noted. Psychiatric: Mood and affect are normal. Speech and behavior are normal.  ____________________________________________   LABS (all labs ordered are listed, but only abnormal results are displayed)  Labs Reviewed  CBC - Abnormal; Notable for the following components:      Result Value   Platelets 143 (*)    All other components within normal limits  TROPONIN I - Abnormal; Notable for the following components:   Troponin I 0.12 (*)    All other components within normal limits  COMPREHENSIVE METABOLIC PANEL - Abnormal; Notable for the following components:   Glucose, Bld 114 (*)    Total Protein 6.3 (*)    All other components within normal limits  LIPASE, BLOOD  PROTIME-INR  APTT   ____________________________________________  EKG  ED ECG REPORT I, Hinda Kehr, the attending physician, personally viewed and interpreted this ECG.  Date: 03/14/2017 EKG Time: 5:23 AM Rate: 54 Rhythm: Sinus bradycardia QRS Axis: Left axis  deviation Intervals: normal ST/T Wave abnormalities: Inverted T waves in leads aVL, V5, and V6 Narrative Interpretation: Non-specific ST segment / T-wave changes, but does not meet STEMI criteria   ____________________________________________  RADIOLOGY   ED MD interpretation: No indication for chest x-ray  Official radiology report(s): No results found.  ____________________________________________   PROCEDURES  Critical Care performed: Yes, see critical care procedure note(s)   Procedure(s) performed:   .Critical Care Performed by: Hinda Kehr, MD Authorized by: Hinda Kehr, MD   Critical care provider statement:    Critical care time (minutes):  30   Critical care time was exclusive of:  Separately billable procedures and treating other patients   Critical care was necessary to treat or prevent imminent or life-threatening deterioration of the following conditions:  Circulatory failure and cardiac failure (NSTEMI)   Critical care was time spent personally by me on the following activities:  Development of treatment plan with patient or surrogate, discussions with consultants, evaluation of patient's response to treatment, examination of patient, obtaining history from patient or surrogate, ordering and performing treatments and interventions, ordering and review of laboratory studies, ordering and review of radiographic studies, pulse oximetry, re-evaluation of patient's condition and review of old charts     ____________________________________________   INITIAL IMPRESSION / [place] / ED COURSE  As part of my medical decision making, I reviewed the following data within the [place] notes reviewed and incorporated, Labs reviewed , EKG interpreted  and Discussed with admitting physician     Differential diagnosis includes, but is not limited to, ACS, aortic dissection, pulmonary embolism, cardiac tamponade, pneumothorax,  pneumonia, pericarditis, myocarditis, GI-related causes including esophagitis/gastritis, and musculoskeletal chest wall pain.    Strongly suspect ACS; at a minimum based on his story he is suffering from unstable angina and will likely require admission and further cardiac workup.  I will wait, however, to see if his troponin is elevated before starting heparin because he may be a candidate for alternative treatment such as Lovenox.  He is pain-free at this time.  Sending off standard lab work.  EKG suggest the possibility of ischemia with inverted T waves new from prior as documented above.  I instructed the patient to let me know if  his pain returns.  Clinical Course as of Mar 14 621  Mon Mar 14, 2017  2248 Combined with clinical picture, this is consistent with NSTEMI.  Starting heparin (bolus + infusion) and will discuss with hospitalist. Troponin I: (!!) 0.12 [CF]  0620 I spoke by phone with Dr. Jerelyn Charles of the hospitalist service.  We discussed the case and he will admit.  [CF]    Clinical Course User Index [CF] Hinda Kehr, MD    ____________________________________________  FINAL CLINICAL IMPRESSION(S) / ED DIAGNOSES  Final diagnoses:  NSTEMI (non-ST elevated myocardial infarction) ([place])     MEDICATIONS GIVEN DURING THIS VISIT:  Medications  heparin injection 4,000 Units and infusion     ED Discharge Orders    None       Note:  This document was prepared using Dragon voice recognition software and may include unintentional dictation errors.    Hinda Kehr, MD 03/14/17 [phone]

## 2017-03-14 NOTE — Progress Notes (Addendum)
Out of facility DNR form in pt's charted and on review of Dr. Rolly Pancake note from ED, pt is noted to be a DNR with documented out of facility form, but order in [facility] is for full code right now.  Dr. Vianne Bulls notified via text page of need for DNR order.  Order received for DNR status.

## 2017-03-14 NOTE — Progress Notes (Signed)
*  PRELIMINARY RESULTS* Echocardiogram 2D Echocardiogram has been performed.  Sherrie Sport 03/14/2017, 3:37 PM

## 2017-03-14 NOTE — Progress Notes (Signed)
ANTICOAGULATION CONSULT NOTE - Initial Consult  Pharmacy Consult for heparin Indication: chest pain/ACS  Allergies  Allergen Reactions  . Ciprofloxacin   . Clarithromycin   . Oxycodone   . Penicillins   . Sulfur     Patient Measurements: Height: 6' (182.9 cm) Weight: 165 lb (74.8 kg) IBW/kg (Calculated) : 77.6 Heparin Dosing Weight: 74.8 kg   Vital Signs: Temp: 97.7 F (36.5 C) (02/18 0533) Temp Source: Oral (02/18 0533) BP: 113/68 (02/18 0533) Pulse Rate: 49 (02/18 0533)  Labs: Recent Labs    03/14/17 0523  HGB 14.5  HCT 42.8  PLT 143*  CREATININE 0.92  TROPONINI 0.12*    Estimated Creatinine Clearance: 65.5 mL/min (by C-G formula based on SCr of 0.92 mg/dL).   Medical History: Past Medical History:  Diagnosis Date  . Allergy   . Anxiety 06/17/2014  . Benign prostatic hyperplasia with urinary obstruction 06/17/2014  . Benign prostatic hypertrophy without urinary obstruction 06/20/2013  . Bladder calculi 04/26/2013  . Calculi, ureter 03/29/2013  . Calculus of kidney 02/25/2013  . Cataract   . Clinical depression 06/17/2014  . Degenerative disc disease, lumbar   . Frank hematuria 06/28/2013  . HLD (hyperlipidemia) 06/17/2014  . Spondylolysis     Medications:  Scheduled:  . heparin  4,000 Units Intravenous Once  . heparin  4,000 Units Intravenous Once    Assessment: Patient admitted CP w/ trops 0.12, being started on heparin.  Goal of Therapy:  Heparin level 0.3-0.7 units/ml Monitor platelets by anticoagulation protocol: Yes   Plan:  Will bolus heparin 4000 units IV x 1 Will start heparin drip @ 1000 units/hr Will check HL @ 1500 Baseline labs drawn Will monitor daily CBC's and adjust per HL's  Tobie Lords, PharmD, BCPS Clinical Pharmacist 03/14/2017

## 2017-03-15 ENCOUNTER — Encounter: Payer: PPO | Admitting: Podiatry

## 2017-03-15 ENCOUNTER — Encounter
Admission: EM | Disposition: A | Discharge status: Home / Self Care | Payer: Self-pay | Source: Home / Self Care | Attending: Internal Medicine

## 2017-03-15 ENCOUNTER — Encounter: Payer: Self-pay | Admitting: *Deleted

## 2017-03-15 DIAGNOSIS — Z955 Presence of coronary angioplasty implant and graft: Secondary | ICD-10-CM

## 2017-03-15 HISTORY — PX: LEFT HEART CATH AND CORONARY ANGIOGRAPHY: CATH118249

## 2017-03-15 HISTORY — PX: CORONARY STENT INTERVENTION: CATH118234

## 2017-03-15 LAB — CBC
HCT: 43 % (ref 40.0–52.0)
Hemoglobin: 14.8 g/dL (ref 13.0–18.0)
MCH: 30.2 pg (ref 26.0–34.0)
MCHC: 34.3 g/dL (ref 32.0–36.0)
MCV: 87.9 fL (ref 80.0–100.0)
PLATELETS: 133 10*3/uL — AB (ref 150–440)
RBC: 4.9 MIL/uL (ref 4.40–5.90)
RDW: 13.1 % (ref 11.5–14.5)
WBC: 6.3 10*3/uL (ref 3.8–10.6)

## 2017-03-15 LAB — HEPARIN LEVEL (UNFRACTIONATED): HEPARIN UNFRACTIONATED: 1.24 [IU]/mL — AB (ref 0.30–0.70)

## 2017-03-15 LAB — POCT ACTIVATED CLOTTING TIME: ACTIVATED CLOTTING TIME: 433 s

## 2017-03-15 SURGERY — LEFT HEART CATH AND CORONARY ANGIOGRAPHY
Anesthesia: Moderate Sedation

## 2017-03-15 MED ORDER — BIVALIRUDIN TRIFLUOROACETATE 250 MG IV SOLR
INTRAVENOUS | Status: AC
  Filled 2017-03-15: qty 250

## 2017-03-15 MED ORDER — HEPARIN (PORCINE) IN NACL 2-0.9 UNIT/ML-% IJ SOLN
INTRAMUSCULAR | Status: AC
  Filled 2017-03-15: qty 500

## 2017-03-15 MED ORDER — LABETALOL HCL 5 MG/ML IV SOLN
INTRAVENOUS | Status: AC
  Filled 2017-03-15: qty 4

## 2017-03-15 MED ORDER — SODIUM CHLORIDE 0.9 % IV SOLN: 250.0000 mL | INTRAVENOUS | Status: DC | PRN

## 2017-03-15 MED ORDER — NITROGLYCERIN 5 MG/ML IV SOLN
INTRAVENOUS | Status: AC
  Filled 2017-03-15: qty 10

## 2017-03-15 MED ORDER — SODIUM CHLORIDE 0.9% FLUSH: 3.0000 mL | INTRAVENOUS | Status: DC | PRN

## 2017-03-15 MED ORDER — FENTANYL CITRATE (PF) 100 MCG/2ML IJ SOLN
INTRAMUSCULAR | Status: DC | PRN
  Administered 2017-03-15: 25 ug via INTRAVENOUS

## 2017-03-15 MED ORDER — ASPIRIN 81 MG PO CHEW
CHEWABLE_TABLET | ORAL | Status: DC | PRN
  Administered 2017-03-15: 243 mg via ORAL

## 2017-03-15 MED ORDER — TICAGRELOR 90 MG PO TABS
ORAL_TABLET | ORAL | Status: DC | PRN
  Administered 2017-03-15: 180 mg via ORAL

## 2017-03-15 MED ORDER — LABETALOL HCL 5 MG/ML IV SOLN: 10.0000 mg | INTRAVENOUS | Status: AC | PRN

## 2017-03-15 MED ORDER — ASPIRIN 81 MG PO CHEW
CHEWABLE_TABLET | ORAL | Status: AC
  Filled 2017-03-15: qty 3

## 2017-03-15 MED ORDER — FENTANYL CITRATE (PF) 100 MCG/2ML IJ SOLN
INTRAMUSCULAR | Status: AC
  Filled 2017-03-15: qty 2

## 2017-03-15 MED ORDER — TICAGRELOR 90 MG PO TABS
ORAL_TABLET | ORAL | Status: AC
  Filled 2017-03-15: qty 2

## 2017-03-15 MED ORDER — ACETAMINOPHEN 325 MG PO TABS
ORAL_TABLET | ORAL | Status: AC
  Administered 2017-03-15: 650 mg via ORAL
  Filled 2017-03-15: qty 2

## 2017-03-15 MED ORDER — NITROGLYCERIN 1 MG/10 ML FOR IR/CATH LAB
INTRA_ARTERIAL | Status: DC | PRN
  Administered 2017-03-15: 200 ug via INTRACORONARY

## 2017-03-15 MED ORDER — SODIUM CHLORIDE 0.9 % WEIGHT BASED INFUSION: 1.0000 mL/kg/h | INTRAVENOUS | Status: AC

## 2017-03-15 MED ORDER — TICAGRELOR 90 MG PO TABS
90.0000 mg | ORAL_TABLET | Freq: Two times a day (BID) | ORAL | Status: DC
  Administered 2017-03-15: 90 mg via ORAL
  Filled 2017-03-15: qty 1

## 2017-03-15 MED ORDER — SODIUM CHLORIDE 0.9 % IV SOLN
0.2500 mg/kg/h | INTRAVENOUS | Status: AC
  Filled 2017-03-15: qty 250

## 2017-03-15 MED ORDER — ONDANSETRON HCL 4 MG/2ML IJ SOLN: 4.0000 mg | Freq: Four times a day (QID) | INTRAMUSCULAR | Status: DC | PRN

## 2017-03-15 MED ORDER — BIVALIRUDIN BOLUS VIA INFUSION - CUPID
INTRAVENOUS | Status: DC | PRN
  Administered 2017-03-15: 53.775 mg via INTRAVENOUS

## 2017-03-15 MED ORDER — IOPAMIDOL (ISOVUE-300) INJECTION 61%
INTRAVENOUS | Status: DC | PRN
  Administered 2017-03-15: 130 mL via INTRA_ARTERIAL

## 2017-03-15 MED ORDER — ASPIRIN 81 MG PO CHEW: 81.0000 mg | CHEWABLE_TABLET | Freq: Every day | ORAL | Status: DC

## 2017-03-15 MED ORDER — SODIUM CHLORIDE 0.9 % IV SOLN
INTRAVENOUS | Status: AC | PRN
  Administered 2017-03-15: 1.75 mg/kg/h via INTRAVENOUS

## 2017-03-15 MED ORDER — HYDRALAZINE HCL 20 MG/ML IJ SOLN: 5.0000 mg | INTRAMUSCULAR | Status: AC | PRN

## 2017-03-15 MED ORDER — MIDAZOLAM HCL 2 MG/2ML IJ SOLN
INTRAMUSCULAR | Status: DC | PRN
  Administered 2017-03-15: 1 mg via INTRAVENOUS

## 2017-03-15 MED ORDER — TICAGRELOR 90 MG PO TABS: 90.0000 mg | ORAL_TABLET | Freq: Two times a day (BID) | ORAL | Status: DC

## 2017-03-15 MED ORDER — LABETALOL HCL 5 MG/ML IV SOLN
INTRAVENOUS | Status: DC | PRN
  Administered 2017-03-15: 10 mg via INTRAVENOUS

## 2017-03-15 MED ORDER — IOPAMIDOL (ISOVUE-300) INJECTION 61%
INTRAVENOUS | Status: DC | PRN
  Administered 2017-03-15: 100 mL via INTRA_ARTERIAL

## 2017-03-15 MED ORDER — MIDAZOLAM HCL 2 MG/2ML IJ SOLN
INTRAMUSCULAR | Status: AC
  Filled 2017-03-15: qty 2

## 2017-03-15 MED ORDER — SODIUM CHLORIDE 0.9% FLUSH
3.0000 mL | Freq: Two times a day (BID) | INTRAVENOUS | Status: DC
  Administered 2017-03-15: 3 mL via INTRAVENOUS

## 2017-03-15 MED ORDER — ACETAMINOPHEN 325 MG PO TABS
650.0000 mg | ORAL_TABLET | ORAL | Status: DC | PRN
  Administered 2017-03-15: 650 mg via ORAL

## 2017-03-15 SURGICAL SUPPLY — 16 items
BALLN TREK RX 2.25X15 (BALLOONS) ×2
BALLOON TREK RX 2.25X15 (BALLOONS) ×1 IMPLANT
CATH 5FR PIGTAIL DIAGNOSTIC (CATHETERS) ×2 IMPLANT
CATH INFINITI 5FR JL4 (CATHETERS) ×2 IMPLANT
CATH INFINITI JR4 5F (CATHETERS) ×2 IMPLANT
CATH VISTA GUIDE 6FR JR4 SH (CATHETERS) ×2 IMPLANT
DEVICE CLOSURE MYNXGRIP 6/7F (Vascular Products) ×2 IMPLANT
DEVICE INFLAT 30 PLUS (MISCELLANEOUS) ×2 IMPLANT
KIT MANI 3VAL PERCEP (MISCELLANEOUS) ×2 IMPLANT
NEEDLE PERC 18GX7CM (NEEDLE) ×2 IMPLANT
PACK CARDIAC CATH (CUSTOM PROCEDURE TRAY) ×2 IMPLANT
SHEATH AVANTI 5FR X 11CM (SHEATH) ×2 IMPLANT
SHEATH AVANTI 6FR X 11CM (SHEATH) ×2 IMPLANT
STENT SIERRA 2.25 X 12 MM (Permanent Stent) ×2 IMPLANT
WIRE G HI TQ BMW 190 (WIRE) ×2 IMPLANT
WIRE GUIDERIGHT .035X150 (WIRE) ×2 IMPLANT

## 2017-03-15 NOTE — Plan of Care (Signed)
  Progressing Clinical Measurements: Cardiovascular complication will be avoided 03/15/2017 0300 - Progressing by Liliane Channel, RN Pain Managment: General experience of comfort will improve 03/15/2017 0300 - Progressing by Liliane Channel, RN Safety: Ability to remain free from injury will improve 03/15/2017 0300 - Progressing by Liliane Channel, RN

## 2017-03-15 NOTE — Progress Notes (Signed)
Patient back from cath. Site stable with PAD at 40cc air. Bedside report from [place]. Per RN, patient can sit up and 1700 and PAD can begin deflating. Patient to remain on bedrest additional 2 hours. Patient aware and compliant. Wife at bedside.

## 2017-03-15 NOTE — Progress Notes (Signed)
Patient Name: Todd Bennett Date of Encounter: 03/15/2017  Hospital Problem List     Active Problems:   NSTEMI (non-ST elevated myocardial infarction) [facility]    Patient Profile     22 with chest pain with mild troponin elevation  Subjective   No further chest pain.  Inpatient Medications    . [MAR Hold] atorvastatin  40 mg Oral q1800  . [MAR Hold] cholecalciferol  1,000 Units Oral Daily  . [MAR Hold] Dapsone   Topical Daily  . [MAR Hold] finasteride  5 mg Oral Daily  . [MAR Hold] FLUoxetine  30 mg Oral Daily  . [MAR Hold] gabapentin  100 mg Oral TID  . [MAR Hold] metoprolol tartrate  12.5 mg Oral BID  . [MAR Hold] metroNIDAZOLE   Topical Daily  . [MAR Hold] multivitamin with minerals  1 tablet Oral Daily  . [MAR Hold] niacin  500 mg Oral QHS  . [MAR Hold] nitroGLYCERIN  1 inch Topical Q6H  . [MAR Hold] omega-3 acid ethyl esters  1 g Oral Daily  . [MAR Hold] pantoprazole (PROTONIX) IV  40 mg Intravenous Q24H  . [MAR Hold] prednisoLONE acetate  1 drop Right Eye Daily  . sodium chloride flush  3 mL Intravenous Q12H  . [MAR Hold] tamsulosin  0.4 mg Oral Daily  . [MAR Hold] vitamin C  1,000 mg Oral Daily    Vital Signs    Vitals:   03/15/17 0343 03/15/17 0839 03/15/17 1012 03/15/17 1115  BP: 132/65 (!) 141/77 (!) 163/67   Pulse: (!) 55 (!) 58 (!) 55   Resp: 17 18 16    Temp: 97.9 F (36.6 C) 97.9 F (36.6 C) 97.9 F (36.6 C)   TempSrc: Oral Oral Oral   SpO2: 94% 98% 94% 95%  Weight: 72 kg (158 lb 11.2 oz)  71.7 kg (158 lb)   Height:   6' (1.829 m)     Intake/Output Summary (Last 24 hours) at 03/15/2017 1254 Last data filed at 03/15/2017 5462 Gross per 24 hour  Intake 508.5 ml  Output 3 ml  Net 505.5 ml   Filed Weights   03/14/17 0533 03/15/17 0343 03/15/17 1012  Weight: 74.8 kg (165 lb) 72 kg (158 lb 11.2 oz) 71.7 kg (158 lb)    Physical Exam    GEN: Well nourished, well developed, in no acute distress.  HEENT: normal.  Neck: Supple, no JVD, carotid  bruits, or masses. Cardiac: RRR, no murmurs, rubs, or gallops. No clubbing, cyanosis, edema.  Radials/DP/PT 2+ and equal bilaterally.  Respiratory:  Respirations regular and unlabored, clear to auscultation bilaterally. GI: Soft, nontender, nondistended, BS + x 4. MS: no deformity or atrophy. Skin: warm and dry, no rash. Neuro:  Strength and sensation are intact. Psych: Normal affect.  Labs    CBC Recent Labs    03/14/17 0523 03/15/17 0950  WBC 6.9 6.3  HGB 14.5 14.8  HCT 42.8 43.0  MCV 88.7 87.9  PLT 143* 703*   Basic Metabolic Panel Recent Labs    03/14/17 0523  NA 140  K 4.0  CL 104  CO2 29  GLUCOSE 114*  BUN 16  CREATININE 0.92  CALCIUM 9.3   Liver Function Tests Recent Labs    03/14/17 0523  AST 25  ALT 23  ALKPHOS 52  BILITOT 0.9  PROT 6.3*  ALBUMIN 3.9   Recent Labs    03/14/17 0523  LIPASE 39   Cardiac Enzymes Recent Labs    03/14/17 0806  03/14/17 1057 03/14/17 1437  TROPONINI 0.16* 0.23* 0.37*   BNP No results for input(s): BNP in the last 72 hours. D-Dimer No results for input(s): DDIMER in the last 72 hours. Hemoglobin A1C No results for input(s): HGBA1C in the last 72 hours. Fasting Lipid Panel Recent Labs    03/14/17 0806  CHOL 181  HDL 43  LDLCALC 120*  TRIG 90  CHOLHDL 4.2   Thyroid Function Tests No results for input(s): TSH, T4TOTAL, T3FREE, THYROIDAB in the last 72 hours.  Invalid input(s): FREET3  Telemetry    nsr  ECG    nsr  Radiology    No results found.  Assessment & Plan    CAD-mild troponin elevation. Cardiac cath revealed 50% distal LM, Insignificant disease in lad and lcx. Diffusley diseased mid rca with 99% distal rca lesion. Referr for [employer].   Signed, Javier Docker Susanna Benge MD 03/15/2017, 12:54 PM  Pager: [phone]

## 2017-03-15 NOTE — Progress Notes (Signed)
[place] [facility] [place] NAME: Todd Bennett    MR#:  [id number]  DATE OF BIRTH:  February 22, 1932  SUBJECTIVE: Seen at bedside, had a cardiac cath today diffuse mid and distal RCA disease scheduled to have stent.  CHIEF COMPLAINT:   Chief Complaint  Patient presents with  . Chest Pain    REVIEW OF SYSTEMS:   ROS CONSTITUTIONAL: No fever, fatigue or weakness.  EYES: No blurred or double vision.  EARS, NOSE, AND THROAT: No tinnitus or ear pain.  RESPIRATORY: No cough, shortness of breath, wheezing or hemoptysis.  CARDIOVASCULAR: No chest pain, orthopnea, edema.  GASTROINTESTINAL: No nausea, vomiting, diarrhea or abdominal pain.  GENITOURINARY: No dysuria, hematuria.  ENDOCRINE: No polyuria, nocturia,  HEMATOLOGY: No anemia, easy bruising or bleeding SKIN: No rash or lesion. MUSCULOSKELETAL: No joint pain or arthritis.   NEUROLOGIC: No tingling, numbness, weakness.  PSYCHIATRY: No anxiety or depression.   DRUG ALLERGIES:   Allergies  Allergen Reactions  . Ciprofloxacin   . Clarithromycin   . Oxycodone   . Penicillins   . Sulfur     VITALS:  Blood pressure 132/62, pulse (!) 53, temperature 97.9 F (36.6 C), temperature source Oral, resp. rate 16, height 6' (1.829 m), weight 71.7 kg (158 lb), SpO2 97 %.  PHYSICAL EXAMINATION:  GENERAL:  84 y.o.-year-old patient lying in the bed with no acute distress.  EYES: Pupils equal, round, reactive to light and accommodation. No scleral icterus. Extraocular muscles intact.  HEENT: Head atraumatic, normocephalic. Oropharynx and nasopharynx clear.  NECK:  Supple, no jugular venous distention. No thyroid enlargement, no tenderness.  LUNGS: Normal breath sounds bilaterally, no wheezing, rales,rhonchi or crepitation. No use of accessory muscles of respiration.  CARDIOVASCULAR: S1, S2 normal. No murmurs, rubs, or gallops.  ABDOMEN: Soft, nontender, nondistended. Bowel sounds present. No  organomegaly or mass.  EXTREMITIES: No pedal edema, cyanosis, or clubbing.  NEUROLOGIC: Cranial nerves II through XII are intact. Muscle strength 5/5 in all extremities. Sensation intact. Gait not checked.  PSYCHIATRIC: The patient is alert and oriented x 3.  SKIN: No obvious rash, lesion, or ulcer.    LABORATORY PANEL:   CBC Recent Labs  Lab 03/15/17 0950  WBC 6.3  HGB 14.8  HCT 43.0  PLT 133*   ------------------------------------------------------------------------------------------------------------------  Chemistries  Recent Labs  Lab 03/14/17 0523  NA 140  K 4.0  CL 104  CO2 29  GLUCOSE 114*  BUN 16  CREATININE 0.92  CALCIUM 9.3  AST 25  ALT 23  ALKPHOS 52  BILITOT 0.9   ------------------------------------------------------------------------------------------------------------------  Cardiac Enzymes Recent Labs  Lab 03/14/17 1437  TROPONINI 0.37*   ------------------------------------------------------------------------------------------------------------------  RADIOLOGY:  No results found.  EKG:   Orders placed or performed during the hospital encounter of 03/14/17  . ED EKG  . ED EKG  . EKG 12-Lead  . EKG 12-Lead  . EKG 12-Lead immediately post procedure  . EKG 12-Lead  . EKG 12-Lead immediately post procedure    ASSESSMENT AND PLAN:   : Chest pain secondary to non-ST elevation MI status post cardiac cath showing diffuse disease in mid RCA, distal RCA, patient to have a stent by Dr. Clayborn Bigness.  Continue beta-blockers, aspirin, nitrates, likely discharge home tomorrow.   #2 clinical depression 3..  BPH       All the records are reviewed and case discussed with Care Management/Social Workerr. Management plans discussed with the patient, family and they are in agreement.  CODE STATUS:  DNR.  TOTAL TIME TAKING CARE OF THIS PATIENT: 35 minutes.   POSSIBLE D/C IN 1-2DAYS, DEPENDING ON CLINICAL CONDITION.   Epifanio Lesches M.D on  03/15/2017 at 2:17 PM  Between 7am to 6pm - Pager - [phone]  After 6pm go to [email or web address] - password [email or web address] [place] [facility]  Office  [phone]  CC: Primary care physician; [name], MD   Note: This dictation was prepared with Dragon dictation along with smaller phrase technology. Any transcriptional errors that result from this process are unintentional.

## 2017-03-16 ENCOUNTER — Encounter: Payer: Self-pay | Admitting: Cardiology

## 2017-03-16 LAB — BASIC METABOLIC PANEL
ANION GAP: 8 (ref 5–15)
BUN: 17 mg/dL (ref 6–20)
CHLORIDE: 105 mmol/L (ref 101–111)
CO2: 25 mmol/L (ref 22–32)
CREATININE: 0.93 mg/dL (ref 0.61–1.24)
Calcium: 8.9 mg/dL (ref 8.9–10.3)
GFR calc non Af Amer: >60 mL/min (ref >60)
Glucose, Bld: 109 mg/dL — ABNORMAL HIGH (ref 65–99)
POTASSIUM: 3.8 mmol/L (ref 3.5–5.1)
Sodium: 138 mmol/L (ref 135–145)

## 2017-03-16 LAB — CBC
HEMATOCRIT: 43.6 % (ref 40.0–52.0)
HEMOGLOBIN: 14.5 g/dL (ref 13.0–18.0)
MCH: 29.7 pg (ref 26.0–34.0)
MCHC: 33.4 g/dL (ref 32.0–36.0)
MCV: 88.9 fL (ref 80.0–100.0)
Platelets: 148 10*3/uL — ABNORMAL LOW (ref 150–440)
RBC: 4.9 MIL/uL (ref 4.40–5.90)
RDW: 13.4 % (ref 11.5–14.5)
WBC: 8.3 10*3/uL (ref 3.8–10.6)

## 2017-03-16 MED ORDER — ATORVASTATIN CALCIUM 40 MG PO TABS: 40.0000 mg | ORAL_TABLET | Freq: Every day | ORAL | 0 refills | Status: AC

## 2017-03-16 MED ORDER — METOPROLOL TARTRATE 25 MG PO TABS: 12.5000 mg | ORAL_TABLET | Freq: Two times a day (BID) | ORAL | 0 refills | Status: DC

## 2017-03-16 MED ORDER — TICAGRELOR 90 MG PO TABS: 90.0000 mg | ORAL_TABLET | Freq: Two times a day (BID) | ORAL | 0 refills | Status: DC

## 2017-03-16 NOTE — Care Management Important Message (Signed)
Important Message  Patient Details  Name: WM SAHAGUN MRN: [id number] Date of Birth: September 18, 1932   Medicare Important Message Given:  N/A - LOS <3 / Initial given by admissions    Katrina Stack, RN 03/16/2017, 9:45 AM

## 2017-03-16 NOTE — Progress Notes (Signed)
84 year old male admitted to hospital on 03/14/2017 for chest pain, ruled in for NSTEMI.  Cardiac Cath showing diffuse disease in mid RCA, distal RCA.  Patient underwent successful PCI with DES to Distal RCA.  Patient on Aspirin, Beta,Brinlinta, Metoprolol, Lipitor.  Patient for discharge today.    Patient sitting up in bed.  Wife and daughter at bedside.    "Heart Attack Bouncing Back" booklet given and reviewed with patient and family.  Discussed the definition of CAD. Reviewed the location CAD and where stents were placed. Patient has stent card. Explained the purpose of the stent card. Instructed patient to keep stent card in her wallet.   Discussed modifiable risk factors including controlling blood pressure, cholesterol, and blood sugar; following heart healthy diet; maintaining healthy weight; exercise; and smoking cessation, if applicable. ?Note: Patient is a former smoker.   Discussed cardiac medications including rationale for taking, mechanisms of action, and side effects. Stressed the importance of taking medications as prescribed.   Discussed emergency plan for heart attack symptoms. Patient verbalized understanding of need to call 911 and not to drive herself to ER if having cardiac symptoms / chest pain.   Heart healthy diet of low sodium, low fat, low cholesterol heart healthy diet discussed. Information on diet provided.   Smoking Cessation - Patient is a former smoker.    Exercise - Benefits of exercised discussed. Informed patient cardiologist has referred him to outpatient Cardiac Rehab. An overview of the program was provided.  Patient is interested in participating, but must find someone to stay with his wife, as she has Parkinson's and he does not leave her alone.  Brochure, informational letter, class and orientation times, and CPT billing codes given to patient.   Patient and family appreciative of the information.     Roanna Epley, RN, BSN, [facility] Cardi[facility] and  Pulmonary Nurse Navigator

## 2017-03-16 NOTE — Progress Notes (Signed)
Pt 's discharge is pending. Pt wants to take medication at home. No 10 a.m. meds will be given. I will continue to assess.

## 2017-03-16 NOTE — Plan of Care (Signed)
  Progressing Education: Knowledge of General Education information will improve 03/16/2017 0207 - Progressing by Marylouise Stacks, RN Health Behavior/Discharge Planning: Ability to manage health-related needs will improve 03/16/2017 0207 - Progressing by Marylouise Stacks, RN Clinical Measurements: Ability to maintain clinical measurements within normal limits will improve 03/16/2017 0207 - Progressing by Marylouise Stacks, RN Will remain free from infection 03/16/2017 0207 - Progressing by Marylouise Stacks, RN Diagnostic test results will improve 03/16/2017 0207 - Progressing by Marylouise Stacks, RN Respiratory complications will improve 03/16/2017 0207 - Progressing by Marylouise Stacks, RN Cardiovascular complication will be avoided 03/16/2017 0207 - Progressing by Marylouise Stacks, RN Activity: Risk for activity intolerance will decrease 03/16/2017 0207 - Progressing by Marylouise Stacks, RN Nutrition: Adequate nutrition will be maintained 03/16/2017 0207 - Progressing by Marylouise Stacks, RN Coping: Level of anxiety will decrease 03/16/2017 0207 - Progressing by Marylouise Stacks, RN Elimination: Will not experience complications related to bowel motility 03/16/2017 0207 - Progressing by Marylouise Stacks, RN Will not experience complications related to urinary retention 03/16/2017 0207 - Progressing by Marylouise Stacks, RN Pain Managment: General experience of comfort will improve 03/16/2017 0207 - Progressing by Marylouise Stacks, RN Safety: Ability to remain free from injury will improve 03/16/2017 0207 - Progressing by Marylouise Stacks, RN Skin Integrity: Risk for impaired skin integrity will decrease 03/16/2017 0207 - Progressing by Marylouise Stacks, RN Education: Understanding of CV disease, CV risk reduction, and recovery process will improve 03/16/2017 0207 - Progressing by Marylouise Stacks, RN Activity: Ability to return to baseline  activity level will improve 03/16/2017 0207 - Progressing by Marylouise Stacks, RN Cardiovascular: Ability to achieve and maintain adequate cardiovascular perfusion will improve 03/16/2017 0207 - Progressing by Marylouise Stacks, RN Vascular access site(s) Level 0-1 will be maintained 03/16/2017 0207 - Progressing by Marylouise Stacks, RN Health Behavior/Discharge Planning: Ability to safely manage health-related needs after discharge will improve 03/16/2017 0207 - Progressing by Marylouise Stacks, RN

## 2017-03-16 NOTE — Progress Notes (Signed)
At shift change this morning the PAD was deflated but still intact. No oozing or blood noted at site. Prior to discharge RN assess site, MD had removed PAD. Right femoral site is clean, dry and intact. No hematoma or bleeding noted. Pt given femoral site care instructions and verbalized understanding. I will continue to assess.

## 2017-03-16 NOTE — Care Management (Signed)
Patient to discharge on Brilinta. Patient and family verbally confirmed pharmacy coverage with his medicare insurance plan.  Provided with coupon for 30 day trial.

## 2017-03-16 NOTE — Progress Notes (Signed)
Patient Name: Todd Bennett Date of Encounter: 03/16/2017  Hospital Problem List     Active Problems:   NSTEMI (non-ST elevated myocardial infarction) [facility])    Patient Profile     84 yo male with nstemi and left cardiac cath revealing 90% distal rca and insignificant disease in the left system. Underwent pci of distal rca  Subjective   Doing well. Cath site clean and dry with no hematoma, bruit and distal pulses intact.   Inpatient Medications    . aspirin  81 mg Oral Daily  . atorvastatin  40 mg Oral q1800  . cholecalciferol  1,000 Units Oral Daily  . Dapsone   Topical Daily  . finasteride  5 mg Oral Daily  . FLUoxetine  30 mg Oral Daily  . gabapentin  100 mg Oral TID  . metoprolol tartrate  12.5 mg Oral BID  . metroNIDAZOLE   Topical Daily  . multivitamin with minerals  1 tablet Oral Daily  . niacin  500 mg Oral QHS  . nitroGLYCERIN  1 inch Topical Q6H  . omega-3 acid ethyl esters  1 g Oral Daily  . pantoprazole (PROTONIX) IV  40 mg Intravenous Q24H  . prednisoLONE acetate  1 drop Right Eye Daily  . sodium chloride flush  3 mL Intravenous Q12H  . tamsulosin  0.4 mg Oral Daily  . ticagrelor  90 mg Oral BID  . vitamin C  1,000 mg Oral Daily    Vital Signs    Vitals:   03/15/17 1714 03/15/17 1932 03/16/17 0358 03/16/17 0755  BP: (!) 150/56 (!) 101/51 121/65 (!) 172/65  Pulse: (!) 53 (!) 56 60 65  Resp:  18 18 18   Temp:  98.2 F (36.8 C) 97.8 F (36.6 C)   TempSrc:  Oral Oral   SpO2:  91% 94% 95%  Weight:   73.3 kg (161 lb 11.2 oz)   Height:        Intake/Output Summary (Last 24 hours) at 03/16/2017 0833 Last data filed at 03/16/2017 0500 Gross per 24 hour  Intake 217.49 ml  Output 1300 ml  Net -1082.51 ml   Filed Weights   03/15/17 0343 03/15/17 1012 03/16/17 0358  Weight: 72 kg (158 lb 11.2 oz) 71.7 kg (158 lb) 73.3 kg (161 lb 11.2 oz)    Physical Exam    GEN: Well nourished, well developed, in no acute distress.  HEENT: normal.  Neck: Supple, no  JVD, carotid bruits, or masses. Cardiac: RRR, no murmurs, rubs, or gallops. No clubbing, cyanosis, edema.  Radials/DP/PT 2+ and equal bilaterally. Cath site with mild ecchymosis with no bruit or hematoma Respiratory:  Respirations regular and unlabored, clear to auscultation bilaterally. GI: Soft, nontender, nondistended, BS + x 4. MS: no deformity or atrophy. Skin: warm and dry, no rash. Neuro:  Strength and sensation are intact. Psych: Normal affect.  Labs    CBC Recent Labs    03/14/17 0523 03/15/17 0950  WBC 6.9 6.3  HGB 14.5 14.8  HCT 42.8 43.0  MCV 88.7 87.9  PLT 143* 101*   Basic Metabolic Panel Recent Labs    03/14/17 0523  NA 140  K 4.0  CL 104  CO2 29  GLUCOSE 114*  BUN 16  CREATININE 0.92  CALCIUM 9.3   Liver Function Tests Recent Labs    03/14/17 0523  AST 25  ALT 23  ALKPHOS 52  BILITOT 0.9  PROT 6.3*  ALBUMIN 3.9   Recent Labs  03/14/17 0523  LIPASE 39   Cardiac Enzymes Recent Labs    03/14/17 0806 03/14/17 1057 03/14/17 1437  TROPONINI 0.16* 0.23* 0.37*   BNP No results for input(s): BNP in the last 72 hours. D-Dimer No results for input(s): DDIMER in the last 72 hours. Hemoglobin A1C No results for input(s): HGBA1C in the last 72 hours. Fasting Lipid Panel Recent Labs    03/14/17 0806  CHOL 181  HDL 43  LDLCALC 120*  TRIG 90  CHOLHDL 4.2   Thyroid Function Tests No results for input(s): TSH, T4TOTAL, T3FREE, THYROIDAB in the last 72 hours.  Invalid input(s): FREET3  Telemetry    nsr  ECG    nsr with no ischemia  Radiology    No results found.  Assessment & Plan    CAD-cath revealed 50% lm, no significant disease in lad or lcx. Diffuse 35% in mid rca with 90% distal rca. Underwent placement of distal rca with a 2.25 x 12 mm Sierra DES. No complications. Doing well post pci. OK for discharge on asa 81 mg daily, Brilinta 90 mg bid, metoprolol tartrate 12.5 mg bid, atorvastatin 40 mg daily, and Phase 2 cardiac  rehab. Follow up in our office in 1 week. No driving or heavy lifting today.   Signed, Javier Docker Matasha Smigelski MD 03/16/2017, 8:33 AM  Pager: [phone]

## 2017-03-19 NOTE — Progress Notes (Signed)
This encounter was created in error - please disregard.

## 2017-03-23 DIAGNOSIS — E785 Hyperlipidemia, unspecified: Secondary | ICD-10-CM | POA: Diagnosis not present

## 2017-03-23 DIAGNOSIS — I2510 Atherosclerotic heart disease of native coronary artery without angina pectoris: Secondary | ICD-10-CM | POA: Diagnosis not present

## 2017-03-25 DIAGNOSIS — I2510 Atherosclerotic heart disease of native coronary artery without angina pectoris: Secondary | ICD-10-CM | POA: Diagnosis not present

## 2017-03-25 DIAGNOSIS — E785 Hyperlipidemia, unspecified: Secondary | ICD-10-CM | POA: Diagnosis not present

## 2017-03-25 DIAGNOSIS — Z955 Presence of coronary angioplasty implant and graft: Secondary | ICD-10-CM | POA: Diagnosis not present

## 2017-03-26 NOTE — Discharge Summary (Signed)
Todd Bennett, is a 84 y.o. male  DOB [birth year]  MRN [id number].  Admission date:  03/14/2017  Admitting Physician  Gorden Harms, MD  Discharge Date:  03/16/2017   Primary MD  [name], MD  Recommendations for primary care physician for things to follow:  Follow with PCP in 1 week With cardiologist Dr. Ubaldo Glassing also  In 1 week  Admission Diagnosis  NSTEMI (non-ST elevated myocardial infarction) [facility]) [I21.4]   Discharge Diagnosis  NSTEMI (non-ST elevated myocardial infarction) ([place]) [I21.4]   Active Problems:   NSTEMI (non-ST elevated myocardial infarction) [facility])      Past Medical History:  Diagnosis Date  . Allergy   . Anxiety 06/17/2014  . Benign prostatic hyperplasia with urinary obstruction 06/17/2014  . Benign prostatic hypertrophy without urinary obstruction 06/20/2013  . Bladder calculi 04/26/2013  . Calculi, ureter 03/29/2013  . Calculus of kidney 02/25/2013  . Cataract   . Clinical depression 06/17/2014  . Degenerative disc disease, lumbar   . Frank hematuria 06/28/2013  . HLD (hyperlipidemia) 06/17/2014  . Spondylolysis     Past Surgical History:  Procedure Laterality Date  . CORONARY STENT INTERVENTION N/A 03/15/2017   Procedure: CORONARY STENT INTERVENTION;  Surgeon: Yolonda Kida, MD;  Location: [place] [facility];  Service: Cardiovascular;  Laterality: N/A;  . HERNIA REPAIR  1993  . ILEOSTOMY    . LEFT HEART CATH AND CORONARY ANGIOGRAPHY N/A 03/15/2017   Procedure: LEFT HEART CATH AND CORONARY ANGIOGRAPHY;  Surgeon: Teodoro Spray, MD;  Location: [place] [facility];  Service: Cardiovascular;  Laterality: N/A;  . LITHOTRIPSY  03/2013       History of present illness and  Hospital Course:     Kindly see H&P for history of present illness and admission details, please review complete Labs,  Consult reports and Test reports for all details in brief  HPI  from the history and physical done on the day of admission 84 year old male patient with history of ADD, hyperlipidemia came in because of chest pain, bradycardia and found to have heart rate around 40s-50s, troponin of 0.12.  Patient admitted for non-ST elevation MI.   Hospital Course  #1 chest pain with non-ST elevation MI: Patient seen by cardiology Dr. Ubaldo Glassing, receiving heparin drip, nitrates, patient is taken to cardiac cath. Following peaked up with 0.23.  We could not use beta-blocker because of bradycardia.  The catheter revealed 90% stenosis of distal RCA and underwent drug-eluting stent in the distal RCA.  After the stent patient did not have any chest pain, discharged home in stable condition, already on Brilinta after the stent placement, advised the patient to be on Brilinta.  Patient bradycardia resolved, heart rate around 65 at the time of discharge, we prescribed him very low-dose metoprolol for cardiac protection.  He is to continue metoprolol 12.5 mg p.o. twice daily, he can follow-up with Dr. Ubaldo Glassing.  Patient is given phase 2 cardiac rehab prescription.   2.  Hyperlipidemia: Continue statins 3.  Anxiety, depression: Continue fluoxetine. 4.  History of BPH: Continue finasteride.  Flomax.   Discharge Condition: Stable   Follow UP  Follow-up Information    [name], MD. Schedule an appointment as soon as possible for a visit in 1 week.   Specialty:  Family Medicine Why:  PLEASE CALL THE OFFICE TO SCHEDULE THIS APPOINTMENT. Thanks! Contact information: [address] [place] [address] [phone]        Teodoro Spray, MD. Daphane Shepherd  on 03/25/2017.   Specialty:  Cardiology Why:  Appointment Time: 8:30am Contact information: [place] [address] [phone]             Discharge Instructions  and  Discharge Medications    Discharge  Instructions    AMB Referral to Cardiac Rehabilitation - Phase II   Complete by:  As directed    Diagnosis:  Coronary Stents     Allergies as of 03/16/2017      Reactions   Ciprofloxacin    Clarithromycin    Oxycodone    Penicillins    Sulfur       Medication List    TAKE these medications   ACZONE 5 % topical gel Generic drug:  Dapsone   aspirin EC 81 MG tablet Take by mouth.   atorvastatin 40 MG tablet Commonly known as:  LIPITOR Take 1 tablet (40 mg total) by mouth daily at 6 PM.   CoQ-10 200 MG Caps Take 200 mg by mouth.   diazepam 5 MG tablet Commonly known as:  VALIUM Take 5 mg by mouth.   finasteride 5 MG tablet Commonly known as:  PROSCAR TAKE 1 TABLET EVERY DAY   Fish Oil 1000 MG Caps Take by mouth daily.   FLUoxetine 20 MG capsule Commonly known as:  PROZAC Take 30 mg by mouth daily.   gabapentin 100 MG capsule Commonly known as:  NEURONTIN Take 1 capsule (100 mg total) by mouth 3 (three) times daily.   meloxicam 15 MG tablet Commonly known as:  MOBIC Take by mouth.   metoprolol tartrate 25 MG tablet Commonly known as:  LOPRESSOR Take 0.5 tablets (12.5 mg total) by mouth 2 (two) times daily.   metroNIDAZOLE 1 % gel Commonly known as:  METROGEL Apply topically.   MULTI-VITAMINS Tabs Take by mouth daily.   niacin 500 MG tablet Take 500 mg by mouth.   NONFORMULARY OR COMPOUNDED ITEM Apply 1-2 g topically 4 (four) times daily.   prednisoLONE acetate 1 % ophthalmic suspension Commonly known as:  PRED FORTE Place 1 drop into the right eye daily.   predniSONE 10 MG tablet Commonly known as:  DELTASONE Take 10 mg by mouth daily with breakfast.   SHINGRIX injection Generic drug:  Zoster Vaccine Adjuvanted   tamsulosin 0.4 MG Caps capsule Commonly known as:  FLOMAX Take 0.4 mg by mouth daily.   ticagrelor 90 MG Tabs tablet Commonly known as:  BRILINTA Take 1 tablet (90 mg total) by mouth 2 (two) times daily.   Venlafaxine HCl 75  MG Tb24 Take 75 mg by mouth daily with breakfast.   vitamin C 1000 MG tablet Take by mouth.   VITAMIN D-1000 MAX ST 1000 units tablet Generic drug:  Cholecalciferol Take by mouth.         Diet and Activity recommendation: See Discharge Instructions above   Consults obtained -cardiology   Major procedures and Radiology Reports - PLEASE review detailed and final reports for all details, in brief -     No results found.  Micro Results     No results found for this or any previous visit (from the past 240 hour(s)).     Today   Subjective:   Jasean Ambrosia today has no headache,no chest abdominal pain,no new weakness tingling or numbness, feels much better wants to go home today.   Objective:   Blood pressure (!) 172/65, pulse 65, temperature 97.8 F (36.6 C), temperature source  Oral, resp. rate 18, height 6' (1.829 m), weight 73.3 kg (161 lb 11.2 oz), SpO2 95 %.  No intake or output data in the 24 hours ending 03/26/17 0726  Exam Awake Alert, Oriented x 3, No new F.N deficits, Normal affect [place].AT,PERRAL Supple Neck,No JVD, No cervical lymphadenopathy appriciated.  Symmetrical Chest wall movement, Good air movement bilaterally, CTAB RRR,No Gallops,Rubs or new Murmurs, No Parasternal Heave +ve B.Sounds, Abd Soft, Non tender, No organomegaly appriciated, No rebound -guarding or rigidity. No Cyanosis, Clubbing or edema, No new Rash or bruise  Data Review   CBC w Diff:  Lab Results  Component Value Date   WBC 8.3 03/16/2017   HGB 14.5 03/16/2017   HGB 16.9 02/20/2013   HCT 43.6 03/16/2017   HCT 50.4 02/20/2013   PLT 148 (L) 03/16/2017   PLT 255 02/20/2013    CMP:  Lab Results  Component Value Date   NA 138 03/16/2017   NA 135 (L) 02/20/2013   K 3.8 03/16/2017   K 3.9 02/20/2013   CL 105 03/16/2017   CL 105 02/20/2013   CO2 25 03/16/2017   CO2 24 02/20/2013   BUN 17 03/16/2017   BUN 21 (H) 02/20/2013   CREATININE 0.93 03/16/2017   CREATININE 1.05  02/20/2013   PROT 6.3 (L) 03/14/2017   PROT 7.4 02/20/2013   ALBUMIN 3.9 03/14/2017   ALBUMIN 3.8 02/20/2013   BILITOT 0.9 03/14/2017   BILITOT 0.7 02/20/2013   ALKPHOS 52 03/14/2017   ALKPHOS 121 (H) 02/20/2013   AST 25 03/14/2017   AST 38 (H) 02/20/2013   ALT 23 03/14/2017   ALT 62 02/20/2013  .   Total Time in preparing paper work, data evaluation and todays exam - 35 minutes  Epifanio Lesches M.D on 03/16/2017 at 7:26 AM    Note: This dictation was prepared with Dragon dictation along with smaller phrase technology. Any transcriptional errors that result from this process are unintentional.

## 2017-03-30 DIAGNOSIS — L821 Other seborrheic keratosis: Secondary | ICD-10-CM | POA: Diagnosis not present

## 2017-03-30 DIAGNOSIS — L570 Actinic keratosis: Secondary | ICD-10-CM | POA: Diagnosis not present

## 2017-03-30 DIAGNOSIS — L578 Other skin changes due to chronic exposure to nonionizing radiation: Secondary | ICD-10-CM | POA: Diagnosis not present

## 2017-03-30 DIAGNOSIS — L820 Inflamed seborrheic keratosis: Secondary | ICD-10-CM | POA: Diagnosis not present

## 2017-04-01 ENCOUNTER — Encounter: Payer: Self-pay | Admitting: Urology

## 2017-04-01 ENCOUNTER — Ambulatory Visit: Payer: PPO | Admitting: Urology

## 2017-04-01 VITALS — BP 119/65 | HR 49 | Ht 72.0 in | Wt 160.0 lb

## 2017-04-01 DIAGNOSIS — N200 Calculus of kidney: Secondary | ICD-10-CM

## 2017-04-01 DIAGNOSIS — N400 Enlarged prostate without lower urinary tract symptoms: Secondary | ICD-10-CM | POA: Diagnosis not present

## 2017-04-01 DIAGNOSIS — R339 Retention of urine, unspecified: Secondary | ICD-10-CM | POA: Diagnosis not present

## 2017-04-01 LAB — BLADDER SCAN AMB NON-IMAGING

## 2017-04-01 NOTE — Progress Notes (Signed)
04/01/2017 9:50 AM   Todd Bennett [birth year] [id number]  Referring provider: [name], MD [phone] [place], [place] [address]  No chief complaint on file.   HPI:  84 year old male who returned today for routine annual follow-up.  He was recently admitted to [facility] with an NSTEMI.  BPH/ incomplete bladder emptying No UTIs  Remote history of bladder stones in '86. History of post op urinary retention. Currently on Flomax and finasteride.   PVR today only 28 cc, down from previous values from 150-200 cc Cr stable, normal, 02/2018 0.9 No voiding complaints today other than urgency, IPSS below  History of nephrolithiasis H/o calcium oxalate monohydrate/ Ca Phosphate stones H/o ilostomy, struggles with dehydration at times Pre2015 multiple recurrent stones  2015 left ESWL followed by left ureteroscopy by Dr. Elnoria Howard for 6 mm ureteral stone No recent flank pain or any stone episodes since last visit Most recent KUB 03/2016 with punctate stones bilaterally   IPSS    Row Name 04/01/17 0900         International Prostate Symptom Score   How often have you had the sensation of not emptying your bladder?  About half the time     How often have you had to urinate less than every two hours?  More than half the time     How often have you found you stopped and started again several times when you urinated?  Less than half the time     How often have you found it difficult to postpone urination?  More than half the time     How often have you had a weak urinary stream?  Less than 1 in 5 times     How often have you had to strain to start urination?  Less than 1 in 5 times     How many times did you typically get up at night to urinate?  2 Times     Total IPSS Score  17       Quality of Life due to urinary symptoms   If you were to spend the rest of your life with your urinary condition just the way it is now how would you feel about that?  Mixed        Score:  1-7 Mild 8-19  Moderate 20-35 Severe   PMH: Past Medical History:  Diagnosis Date  . Allergy   . Anxiety 06/17/2014  . Benign prostatic hyperplasia with urinary obstruction 06/17/2014  . Benign prostatic hypertrophy without urinary obstruction 06/20/2013  . Bladder calculi 04/26/2013  . Calculi, ureter 03/29/2013  . Calculus of kidney 02/25/2013  . Cataract   . Clinical depression 06/17/2014  . Degenerative disc disease, lumbar   . Frank hematuria 06/28/2013  . HLD (hyperlipidemia) 06/17/2014  . Spondylolysis     Surgical History: Past Surgical History:  Procedure Laterality Date  . CORONARY STENT INTERVENTION N/A 03/15/2017   Procedure: CORONARY STENT INTERVENTION;  Surgeon: Yolonda Kida, MD;  Location: [place] [facility];  Service: Cardiovascular;  Laterality: N/A;  . HERNIA REPAIR  1993  . ILEOSTOMY    . LEFT HEART CATH AND CORONARY ANGIOGRAPHY N/A 03/15/2017   Procedure: LEFT HEART CATH AND CORONARY ANGIOGRAPHY;  Surgeon: Teodoro Spray, MD;  Location: [facility];  Service: Cardiovascular;  Laterality: N/A;  . LITHOTRIPSY  03/2013    Home Medications:  Allergies as of 04/01/2017      Reactions   Ciprofloxacin    Clarithromycin  Oxycodone    Penicillins    Sulfur       Medication List        Accurate as of 04/01/17  9:50 AM. Always use your most recent med list.          aspirin EC 81 MG tablet Take by mouth.   atorvastatin 40 MG tablet Commonly known as:  LIPITOR Take 1 tablet (40 mg total) by mouth daily at 6 PM.   CoQ-10 200 MG Caps Take 200 mg by mouth.   finasteride 5 MG tablet Commonly known as:  PROSCAR TAKE 1 TABLET EVERY DAY   Fish Oil 1000 MG Caps Take by mouth daily.   FLUoxetine 20 MG capsule Commonly known as:  PROZAC Take 30 mg by mouth daily.   metoprolol tartrate 25 MG tablet Commonly known as:  LOPRESSOR Take 0.5 tablets (12.5 mg total) by mouth 2 (two) times daily.   metroNIDAZOLE 1 % gel Commonly known as:  METROGEL Apply  topically.   MULTI-VITAMINS Tabs Take by mouth daily.   NONFORMULARY OR COMPOUNDED ITEM Apply 1-2 g topically 4 (four) times daily.   prednisoLONE acetate 1 % ophthalmic suspension Commonly known as:  PRED FORTE Place 1 drop into the right eye daily.   SHINGRIX injection Generic drug:  Zoster Vaccine Adjuvanted   tamsulosin 0.4 MG Caps capsule Commonly known as:  FLOMAX Take 0.4 mg by mouth daily.   ticagrelor 90 MG Tabs tablet Commonly known as:  BRILINTA Take 1 tablet (90 mg total) by mouth 2 (two) times daily.   Venlafaxine HCl 75 MG Tb24 Take 75 mg by mouth daily with breakfast.   vitamin C 1000 MG tablet Take by mouth.   VITAMIN D-1000 MAX ST 1000 units tablet Generic drug:  Cholecalciferol Take by mouth.       Allergies:  Allergies  Allergen Reactions  . Ciprofloxacin   . Clarithromycin   . Oxycodone   . Penicillins   . Sulfur     Family History: Family History  Problem Relation Age of Onset  . Diabetes Mother   . Prostate cancer Neg Hx   . Bladder Cancer Neg Hx     Social History:  reports that he has quit smoking. he has never used smokeless tobacco. He reports that he does not drink alcohol or use drugs.  ROS: UROLOGY Frequent Urination?: No Hard to postpone urination?: Yes Burning/pain with urination?: No Get up at night to urinate?: Yes Leakage of urine?: No Urine stream starts and stops?: No Trouble starting stream?: No Do you have to strain to urinate?: No Blood in urine?: No Urinary tract infection?: No Sexually transmitted disease?: No Injury to kidneys or bladder?: No Painful intercourse?: No Weak stream?: No Erection problems?: Yes Penile pain?: No  Gastrointestinal Nausea?: No Vomiting?: No Indigestion/heartburn?: No Diarrhea?: No Constipation?: No  Constitutional Fever: No Night sweats?: No Weight loss?: No Fatigue?: No  Skin Skin rash/lesions?: No Itching?: No  Eyes Blurred vision?: No Double vision?:  No  Ears/Nose/Throat Sore throat?: No Sinus problems?: No  Hematologic/Lymphatic Swollen glands?: No Easy bruising?: No  Cardiovascular Leg swelling?: No Chest pain?: No  Respiratory Cough?: No Shortness of breath?: No  Endocrine Excessive thirst?: No  Musculoskeletal Back pain?: No Joint pain?: No  Neurological Headaches?: No Dizziness?: No  Psychologic Depression?: No Anxiety?: No  Physical Exam: BP 119/65   Pulse (!) 49   Ht 6' (1.829 m)   Wt 160 lb (72.6 kg)   BMI 21.70 kg/m  Constitutional:  Alert and oriented, No acute distress. HEENT: [place] AT, moist mucus membranes.  Trachea midline, no masses. Cardiovascular: No clubbing, cyanosis, or edema. Respiratory: Normal respiratory effort, no increased work of breathing. Skin: No rashes, bruises or suspicious lesions. Neurologic: Grossly intact, no focal deficits, moving all 4 extremities. Psychiatric: Normal mood and affect.  Laboratory Data: Lab Results  Component Value Date   WBC 8.3 03/16/2017   HGB 14.5 03/16/2017   HCT 43.6 03/16/2017   MCV 88.9 03/16/2017   PLT 148 (L) 03/16/2017     PSA 03/12/15 0.25, stable from 0.29 on 11/25  Urinalysis N/a   Pertinent Imaging: Results for orders placed or performed in visit on 04/01/17  BLADDER SCAN AMB NON-IMAGING  Result Value Ref Range   Scan Result 54ml     Assessment & Plan:    1. BPH (benign prostatic hyperplasia) Continue finasteride and flomax PSA previously low, given age will defer further labs - Urinalysis, Complete - BLADDER SCAN AMB NON-IMAGING  2. History of incomplete bladder emptying PVR minimal- improved from previously   3. History of nephrolithiasis Asymptomatic  Continue to encourage hydration Return sooner if develops flank pain KUB next year  Return in about 1 year (around 04/02/2018) for KUB.  Hollice Espy, MD  [facility] [address] [place], [place], [place] [address] [phone]

## 2017-04-19 ENCOUNTER — Ambulatory Visit: Payer: PPO | Admitting: Podiatry

## 2017-04-19 ENCOUNTER — Encounter: Payer: Self-pay | Admitting: Podiatry

## 2017-04-19 DIAGNOSIS — M7751 Other enthesopathy of right foot: Secondary | ICD-10-CM | POA: Diagnosis not present

## 2017-04-19 DIAGNOSIS — M67472 Ganglion, left ankle and foot: Secondary | ICD-10-CM

## 2017-04-19 DIAGNOSIS — M779 Enthesopathy, unspecified: Principal | ICD-10-CM

## 2017-04-19 DIAGNOSIS — M778 Other enthesopathies, not elsewhere classified: Secondary | ICD-10-CM

## 2017-04-19 DIAGNOSIS — M7752 Other enthesopathy of left foot: Secondary | ICD-10-CM | POA: Diagnosis not present

## 2017-04-20 DIAGNOSIS — I2510 Atherosclerotic heart disease of native coronary artery without angina pectoris: Secondary | ICD-10-CM | POA: Diagnosis not present

## 2017-04-20 DIAGNOSIS — E785 Hyperlipidemia, unspecified: Secondary | ICD-10-CM | POA: Diagnosis not present

## 2017-04-21 NOTE — Progress Notes (Signed)
   HPI: 84 year old male presenting today for follow up evaluation of bilateral foot pain. He reports pain to both feet but states the left foot is worse. He reports significant relief after receiving an injection at his previous visit. He has not done anything to treat the pain. There are no modifying factors noted. Patient is here for further evaluation and treatment.   Past Medical History:  Diagnosis Date  . Allergy   . Anxiety 06/17/2014  . Benign prostatic hyperplasia with urinary obstruction 06/17/2014  . Benign prostatic hypertrophy without urinary obstruction 06/20/2013  . Bladder calculi 04/26/2013  . Calculi, ureter 03/29/2013  . Calculus of kidney 02/25/2013  . Cataract   . Clinical depression 06/17/2014  . Degenerative disc disease, lumbar   . Frank hematuria 06/28/2013  . HLD (hyperlipidemia) 06/17/2014  . Spondylolysis      Physical Exam: General: The patient is alert and oriented x3 in no acute distress.  Dermatology: Skin is warm, dry and supple bilateral lower extremities. Negative for open lesions or macerations.  Vascular: Palpable pedal pulses bilaterally. No edema or erythema noted. Capillary refill within normal limits.  Neurological: Epicritic and protective threshold grossly intact bilaterally.   Musculoskeletal Exam: Fluctuant, non-adhered mass noted to the dorsum of the left foot. Pain with palpation noted to the midfoot bilaterally. Range of motion within normal limits to all pedal and ankle joints bilateral. Muscle strength 5/5 in all groups bilateral.   Assessment: - Midfoot capsulitis bilateral  - ganglion cyst left dorsal foot   Plan of Care:  - Patient evaluated.  - Injection of 0.5 mLs Celestone Soluspan injected into the midfoot bilaterally.  - Compression anklet dispensed to apply compression to left dorsal ganglion.  - Return to clinic as needed.     Edrick Kins, DPM [facility]  Dr. Edrick Kins, DPM    [address], [place] [address]                Office [phone]  Fax [phone]

## 2017-06-29 DIAGNOSIS — Z933 Colostomy status: Secondary | ICD-10-CM | POA: Diagnosis not present

## 2017-06-29 DIAGNOSIS — I2510 Atherosclerotic heart disease of native coronary artery without angina pectoris: Secondary | ICD-10-CM | POA: Diagnosis not present

## 2017-06-29 DIAGNOSIS — E785 Hyperlipidemia, unspecified: Secondary | ICD-10-CM | POA: Diagnosis not present

## 2017-07-27 DIAGNOSIS — M5442 Lumbago with sciatica, left side: Secondary | ICD-10-CM | POA: Diagnosis not present

## 2017-07-27 DIAGNOSIS — F419 Anxiety disorder, unspecified: Secondary | ICD-10-CM | POA: Diagnosis not present

## 2017-07-27 DIAGNOSIS — I2510 Atherosclerotic heart disease of native coronary artery without angina pectoris: Secondary | ICD-10-CM | POA: Diagnosis not present

## 2017-07-27 DIAGNOSIS — Z0000 Encounter for general adult medical examination without abnormal findings: Secondary | ICD-10-CM | POA: Diagnosis not present

## 2017-07-27 DIAGNOSIS — M5441 Lumbago with sciatica, right side: Secondary | ICD-10-CM | POA: Diagnosis not present

## 2017-07-27 DIAGNOSIS — E785 Hyperlipidemia, unspecified: Secondary | ICD-10-CM | POA: Diagnosis not present

## 2017-08-03 DIAGNOSIS — H18421 Band keratopathy, right eye: Secondary | ICD-10-CM | POA: Diagnosis not present

## 2017-10-09 ENCOUNTER — Other Ambulatory Visit: Payer: Self-pay

## 2017-10-09 ENCOUNTER — Encounter: Payer: Self-pay | Admitting: Emergency Medicine

## 2017-10-09 ENCOUNTER — Emergency Department
Admission: EM | Admit: 2017-10-09 | Discharge: 2017-10-10 | Disposition: A | Discharge status: Home / Self Care | Payer: PPO | Attending: Emergency Medicine | Admitting: Emergency Medicine

## 2017-10-09 DIAGNOSIS — Z7982 Long term (current) use of aspirin: Secondary | ICD-10-CM | POA: Diagnosis not present

## 2017-10-09 DIAGNOSIS — R319 Hematuria, unspecified: Secondary | ICD-10-CM | POA: Diagnosis not present

## 2017-10-09 DIAGNOSIS — Z79899 Other long term (current) drug therapy: Secondary | ICD-10-CM | POA: Diagnosis not present

## 2017-10-09 DIAGNOSIS — R1030 Lower abdominal pain, unspecified: Secondary | ICD-10-CM | POA: Diagnosis not present

## 2017-10-09 DIAGNOSIS — N390 Urinary tract infection, site not specified: Secondary | ICD-10-CM | POA: Insufficient documentation

## 2017-10-09 DIAGNOSIS — Z87891 Personal history of nicotine dependence: Secondary | ICD-10-CM | POA: Diagnosis not present

## 2017-10-09 DIAGNOSIS — I252 Old myocardial infarction: Secondary | ICD-10-CM | POA: Insufficient documentation

## 2017-10-09 LAB — CBC WITH DIFFERENTIAL/PLATELET
BASOS ABS: 0.1 10*3/uL (ref 0–0.1)
Basophils Relative: 1 %
EOS PCT: 6 %
Eosinophils Absolute: 0.5 10*3/uL (ref 0–0.7)
HEMATOCRIT: 42.8 % (ref 40.0–52.0)
Hemoglobin: 14.7 g/dL (ref 13.0–18.0)
LYMPHS ABS: 2.1 10*3/uL (ref 1.0–3.6)
LYMPHS PCT: 27 %
MCH: 30.2 pg (ref 26.0–34.0)
MCHC: 34.2 g/dL (ref 32.0–36.0)
MCV: 88.2 fL (ref 80.0–100.0)
MONO ABS: 0.8 10*3/uL (ref 0.2–1.0)
MONOS PCT: 10 %
NEUTROS ABS: 4.4 10*3/uL (ref 1.4–6.5)
Neutrophils Relative %: 56 %
PLATELETS: 166 10*3/uL (ref 150–440)
RBC: 4.86 MIL/uL (ref 4.40–5.90)
RDW: 13.3 % (ref 11.5–14.5)
WBC: 7.9 10*3/uL (ref 3.8–10.6)

## 2017-10-09 LAB — BASIC METABOLIC PANEL
ANION GAP: 5 (ref 5–15)
BUN: 16 mg/dL (ref 8–23)
CALCIUM: 8.8 mg/dL — AB (ref 8.9–10.3)
CO2: 24 mmol/L (ref 22–32)
Chloride: 107 mmol/L (ref 98–111)
Creatinine, Ser: 0.96 mg/dL (ref 0.61–1.24)
GFR calc Af Amer: >60 mL/min (ref >60)
GLUCOSE: 108 mg/dL — AB (ref 70–99)
Potassium: 3.9 mmol/L (ref 3.5–5.1)
Sodium: 136 mmol/L (ref 135–145)

## 2017-10-09 LAB — URINALYSIS, COMPLETE (UACMP) WITH MICROSCOPIC
Bacteria, UA: NONE SEEN
SQUAMOUS EPITHELIAL / LPF: NONE SEEN (ref 0–5)
Specific Gravity, Urine: 1.021 (ref 1.005–1.030)
WBC, UA: >50 WBC/hpf — ABNORMAL HIGH (ref 0–5)

## 2017-10-09 MED ORDER — NITROFURANTOIN MONOHYD MACRO 100 MG PO CAPS: 100.0000 mg | ORAL_CAPSULE | Freq: Two times a day (BID) | ORAL | 0 refills | Status: AC

## 2017-10-09 MED ORDER — NITROFURANTOIN MONOHYD MACRO 100 MG PO CAPS
100.0000 mg | ORAL_CAPSULE | Freq: Once | ORAL | Status: AC
  Administered 2017-10-10: 100 mg via ORAL
  Filled 2017-10-09: qty 1

## 2017-10-09 NOTE — ED Triage Notes (Signed)
Pt arrives POV to triage with c/o bloody urine x 2 days. Pt is having same at this time. Pt appears in NAD.

## 2017-10-09 NOTE — Discharge Instructions (Addendum)
Please seek medical attention for any high fevers, chest pain, shortness of breath, change in behavior, persistent vomiting, bloody stool or any other new or concerning symptoms.  

## 2017-10-09 NOTE — ED Provider Notes (Signed)
[facility] Emergency Department Provider Note  ____________________________________________   I have reviewed the triage vital signs and the nursing notes.   HISTORY  Chief Complaint Hematuria   History limited by: Not Limited   HPI Todd Bennett is a 84 y.o. male who presents to the emergency department today because of concern for bloody urine. The patient states he first noticed a change a couple of days ago when he had cloudy urination. He states that today he noticed some streaks and then later this evening it became grossly bloody. This has been associated with some minimally lower abdominal discomfort. He denies any fevers. Has had some nausea.   Per medical record review patient has a history of frank hematuria, bladder calculi.  Past Medical History:  Diagnosis Date  . Allergy   . Anxiety 06/17/2014  . Benign prostatic hyperplasia with urinary obstruction 06/17/2014  . Benign prostatic hypertrophy without urinary obstruction 06/20/2013  . Bladder calculi 04/26/2013  . Calculi, ureter 03/29/2013  . Calculus of kidney 02/25/2013  . Cataract   . Clinical depression 06/17/2014  . Degenerative disc disease, lumbar   . Frank hematuria 06/28/2013  . HLD (hyperlipidemia) 06/17/2014  . Spondylolysis     Patient Active Problem List   Diagnosis Date Noted  . NSTEMI (non-ST elevated myocardial infarction) ([place]) 03/14/2017  . Anxiety 06/17/2014  . Benign prostatic hyperplasia with urinary obstruction 06/17/2014  . Clinical depression 06/17/2014  . HLD (hyperlipidemia) 06/17/2014  . Frank hematuria 06/28/2013  . Benign prostatic hypertrophy without urinary obstruction 06/20/2013  . Bladder calculi 04/26/2013  . Calculi, ureter 03/29/2013  . Calculus of kidney 02/25/2013    Past Surgical History:  Procedure Laterality Date  . CORONARY STENT INTERVENTION N/A 03/15/2017   Procedure: CORONARY STENT INTERVENTION;  Surgeon: Yolonda Kida, MD;  Location: [facility];  Service: Cardiovascular;  Laterality: N/A;  . HERNIA REPAIR  1993  . ILEOSTOMY    . LEFT HEART CATH AND CORONARY ANGIOGRAPHY N/A 03/15/2017   Procedure: LEFT HEART CATH AND CORONARY ANGIOGRAPHY;  Surgeon: Teodoro Spray, MD;  Location: [facility];  Service: Cardiovascular;  Laterality: N/A;  . LITHOTRIPSY  03/2013    Prior to Admission medications   Medication Sig Start Date End Date Taking? Authorizing Provider  Ascorbic Acid (VITAMIN C) 1000 MG tablet Take by mouth.    [provider]  aspirin EC 81 MG tablet Take by mouth.    [provider]  atorvastatin (LIPITOR) 40 MG tablet Take 1 tablet (40 mg total) by mouth daily at 6 PM. 03/16/17   Epifanio Lesches, MD  Cholecalciferol (VITAMIN D-1000 MAX ST) 1000 units tablet Take by mouth.    [provider]  Coenzyme Q10 (COQ-10) 200 MG CAPS Take 200 mg by mouth.    [provider]  finasteride (PROSCAR) 5 MG tablet TAKE 1 TABLET EVERY DAY 01/15/17   McGowan, Larene Beach A, PA-C  FLUoxetine (PROZAC) 20 MG capsule Take 30 mg by mouth daily.     [provider]  metoprolol tartrate (LOPRESSOR) 25 MG tablet Take 0.5 tablets (12.5 mg total) by mouth 2 (two) times daily. 03/16/17   Epifanio Lesches, MD  metroNIDAZOLE (METROGEL) 1 % gel Apply topically.    [provider]  Multiple Vitamin (MULTI-VITAMINS) TABS Take by mouth daily.     [provider]  NONFORMULARY OR COMPOUNDED ITEM Apply 1-2 g topically 4 (four) times daily. Patient not taking: Reported on 03/14/2017 10/24/15  Edrick Kins, DPM  Omega-3 Fatty Acids (FISH OIL) 1000 MG CAPS Take by mouth daily.     [provider]  prednisoLONE acetate (PRED FORTE) 1 % ophthalmic suspension Place 1 drop into the right eye daily.    [provider]  [facility] injection  08/23/16   [provider]  tamsulosin (FLOMAX) 0.4 MG CAPS capsule Take 0.4 mg by mouth daily.     [provider]  ticagrelor (BRILINTA) 90 MG TABS tablet Take 1 tablet (90 mg total) by mouth 2 (two) times daily. 03/16/17   Epifanio Lesches, MD  Venlafaxine HCl 75 MG TB24 Take 75 mg by mouth daily with breakfast.     [provider]    Allergies Ciprofloxacin; Clarithromycin; Oxycodone; Penicillins; and Sulfur  Family History  Problem Relation Age of Onset  . Diabetes Mother   . Prostate cancer Neg Hx   . Bladder Cancer Neg Hx     Social History Social History   Tobacco Use  . Smoking status: Former Research scientist (life sciences)  . Smokeless tobacco: Never Used  Substance Use Topics  . Alcohol use: No    Alcohol/week: 0.0 standard drinks  . Drug use: No    Review of Systems Constitutional: No fever/chills Eyes: No visual changes. ENT: No sore throat. Cardiovascular: Denies chest pain. Respiratory: Denies shortness of breath. Gastrointestinal: No abdominal pain.  No nausea, no vomiting.  No diarrhea.   Genitourinary: Positive for bloody urination. Musculoskeletal: Negative for back pain. Skin: Negative for rash. Neurological: Negative for headaches, focal weakness or numbness.  ____________________________________________   PHYSICAL EXAM:  VITAL SIGNS: ED Triage Vitals  Enc Vitals Group     BP 10/09/17 2201 135/73     Pulse Rate 10/09/17 2201 68     Resp 10/09/17 2201 18     Temp 10/09/17 2201 97.6 F (36.4 C)     Temp Source 10/09/17 2201 Oral     SpO2 10/09/17 2201 97 %     Weight 10/09/17 2203 163 lb (73.9 kg)     Height 10/09/17 2203 5\' 11"  (1.803 m)     Head Circumference --      Peak Flow --      Pain Score 10/09/17 2210 0   Constitutional: Alert and oriented.  Eyes: Conjunctivae are normal.  ENT      Head: Normocephalic and atraumatic.      Nose: No congestion/rhinnorhea.      Mouth/Throat: Mucous membranes are moist.      Neck: No stridor. Hematological/Lymphatic/Immunilogical: No cervical lymphadenopathy. Cardiovascular: Normal rate, regular rhythm.   No murmurs, rubs, or gallops.  Respiratory: Normal respiratory effort without tachypnea nor retractions. Breath sounds are clear and equal bilaterally. No wheezes/rales/rhonchi. Gastrointestinal: Soft and non tender. No rebound. No guarding.  Genitourinary: Deferred Musculoskeletal: Normal range of motion in all extremities. No lower extremity edema. Neurologic:  Normal speech and language. No gross focal neurologic deficits are appreciated.  Skin:  Skin is warm, dry and intact. No rash noted. Psychiatric: Mood and affect are normal. Speech and behavior are normal. Patient exhibits appropriate insight and judgment.  ____________________________________________    LABS (pertinent positives/negatives)  BMP glu 108, cr 0.96 UA cloudy, >50 RBC, >50 WBC, multiple values not reported secondary to blood CBC wbc 7.9, hgb 14.7, plt 166 ____________________________________________   EKG  None  ____________________________________________    RADIOLOGY  None  ____________________________________________   PROCEDURES  Procedures  ____________________________________________   INITIAL IMPRESSION / ASSESSMENT AND PLAN / ED  COURSE  Pertinent labs & imaging results that were available during my care of the patient were reviewed by me and considered in my medical decision making (see chart for details).   Patient presented to the emergency department today with concerns for hematuria.  Urine does show significant blood and greater than 50 white blood cells.  Blood work without any concerning kidney dysfunction.  At this point unclear etiology of the bleeding.  However will empirically treat for infection in case that is the cause.  Will have patient follow-up with his urologist. Discussed findings and plan with patient.   ____________________________________________   FINAL CLINICAL IMPRESSION(S) / ED DIAGNOSES  Final diagnoses:  Hematuria, unspecified type  Lower urinary tract  infectious disease     Note: This dictation was prepared with Dragon dictation. Any transcriptional errors that result from this process are unintentional     Nance Pear, MD 10/10/17 1931

## 2017-10-10 ENCOUNTER — Telehealth: Payer: Self-pay | Admitting: Urology

## 2017-10-10 NOTE — Telephone Encounter (Signed)
Please let this patient know that I like to see him nonurgently for follow-up.  It looks like he needs a hematuria work-up (imaging/ cystoscopy).  He can see either myself or Shannon.  Hollice Espy, MD

## 2017-10-10 NOTE — Telephone Encounter (Signed)
FYI - pt called Todd Bennett stating that he was instructed to call the office to let Dr. Erlene Quan know he was seen in ER for Hematuria and UTI, saw Dr. Archie Balboa, ran tests, gave pt antibiotic, pt calling per instructions from ER physician to let Dr. Erlene Quan aware, pt states he is doing okay just feeling sick and on Plavix, pt states he is doing what he was told to do by ER and if need to please call him. Thank you

## 2017-10-11 LAB — URINE CULTURE: CULTURE: NO GROWTH

## 2017-10-11 NOTE — Telephone Encounter (Signed)
Lm for patient to cb to schedule app  MB

## 2017-10-13 ENCOUNTER — Encounter: Payer: Self-pay | Admitting: Urology

## 2017-10-13 ENCOUNTER — Ambulatory Visit: Payer: PPO | Admitting: Urology

## 2017-10-13 ENCOUNTER — Other Ambulatory Visit: Payer: Self-pay | Admitting: Urology

## 2017-10-13 ENCOUNTER — Telehealth: Payer: Self-pay | Admitting: Urology

## 2017-10-13 VITALS — BP 176/73 | HR 81 | Ht 72.0 in | Wt 165.0 lb

## 2017-10-13 DIAGNOSIS — N400 Enlarged prostate without lower urinary tract symptoms: Secondary | ICD-10-CM | POA: Diagnosis not present

## 2017-10-13 DIAGNOSIS — N138 Other obstructive and reflux uropathy: Secondary | ICD-10-CM | POA: Diagnosis not present

## 2017-10-13 DIAGNOSIS — R310 Gross hematuria: Secondary | ICD-10-CM

## 2017-10-13 DIAGNOSIS — N401 Enlarged prostate with lower urinary tract symptoms: Secondary | ICD-10-CM

## 2017-10-13 LAB — BLADDER SCAN AMB NON-IMAGING: SCAN RESULT: 48

## 2017-10-13 NOTE — Progress Notes (Signed)
See noted dated 09/189/2019

## 2017-10-13 NOTE — Progress Notes (Signed)
10/13/2017 2:32 PM   Todd Bennett 1932/05/09 [id number]  Referring provider: [name], MD [phone] [place], [place] [address]  Chief Complaint  Patient presents with  . Follow-up    HPI: Patient is a 84 -year-old Caucasian male who presents today as a referral from [facility] ED for gross hematuria and clots.    He states it resolved after his ER visit, but the gross hematuria and clots returned last night.  The urine is now the color of tea.  He is now having frequency, nocturia, intermittency and hesitancy.    He does have a history of nephrolithiasis, bladder stones, BPH with incomplete emptying.    His UA today is tea colored with > 30 RBC's.  His PVR is 48 mL.  He states his urine was clear yellow when he emptied his bladder for the bladder scan.  Patient denies any dysuria or suprapubic/flank pain.  Patient denies any fevers, chills, nausea or vomiting.    He quit smoking in 1965.  He states he was a heavy smoker.  He is not around second smoke.  He has not worked with chemicals.  He has HTN.    PMH: Past Medical History:  Diagnosis Date  . Allergy   . Anxiety 06/17/2014  . Benign prostatic hyperplasia with urinary obstruction 06/17/2014  . Benign prostatic hypertrophy without urinary obstruction 06/20/2013  . Bladder calculi 04/26/2013  . Calculi, ureter 03/29/2013  . Calculus of kidney 02/25/2013  . Cataract   . Clinical depression 06/17/2014  . Degenerative disc disease, lumbar   . Frank hematuria 06/28/2013  . HLD (hyperlipidemia) 06/17/2014  . Spondylolysis     Surgical History: Past Surgical History:  Procedure Laterality Date  . CORONARY STENT INTERVENTION N/A 03/15/2017   Procedure: CORONARY STENT INTERVENTION;  Surgeon: Yolonda Kida, MD;  Location: [facility];  Service: Cardiovascular;  Laterality: N/A;  . HERNIA REPAIR  1993  . ILEOSTOMY    . LEFT HEART CATH AND CORONARY ANGIOGRAPHY N/A 03/15/2017   Procedure: LEFT HEART CATH AND  CORONARY ANGIOGRAPHY;  Surgeon: Teodoro Spray, MD;  Location: [facility];  Service: Cardiovascular;  Laterality: N/A;  . LITHOTRIPSY  03/2013    Home Medications:  Allergies as of 10/13/2017      Reactions   Ciprofloxacin    Clarithromycin    Oxycodone    Penicillins    Sulfur       Medication List        Accurate as of 10/13/17  2:32 PM. Always use your most recent med list.          aspirin EC 81 MG tablet Take by mouth.   atorvastatin 40 MG tablet Commonly known as:  LIPITOR Take 1 tablet (40 mg total) by mouth daily at 6 PM.   CoQ-10 200 MG Caps Take 200 mg by mouth.   finasteride 5 MG tablet Commonly known as:  PROSCAR TAKE 1 TABLET EVERY DAY   Fish Oil 1000 MG Caps Take by mouth daily.   FLUoxetine 20 MG capsule Commonly known as:  PROZAC Take 30 mg by mouth daily.   metoprolol tartrate 25 MG tablet Commonly known as:  LOPRESSOR Take 0.5 tablets (12.5 mg total) by mouth 2 (two) times daily.   metroNIDAZOLE 1 % gel Commonly known as:  METROGEL Apply topically.   MULTI-VITAMINS Tabs Take by mouth daily.   nitrofurantoin (macrocrystal-monohydrate) 100 MG capsule Commonly known as:  MACROBID Take 1 capsule (  100 mg total) by mouth 2 (two) times daily for 5 days.   NONFORMULARY OR COMPOUNDED ITEM Apply 1-2 g topically 4 (four) times daily.   prednisoLONE acetate 1 % ophthalmic suspension Commonly known as:  PRED FORTE Place 1 drop into the right eye daily.   SHINGRIX injection Generic drug:  Zoster Vaccine Adjuvanted   tamsulosin 0.4 MG Caps capsule Commonly known as:  FLOMAX Take 0.4 mg by mouth daily.   ticagrelor 90 MG Tabs tablet Commonly known as:  BRILINTA Take 1 tablet (90 mg total) by mouth 2 (two) times daily.   Venlafaxine HCl 75 MG Tb24 Take 75 mg by mouth daily with breakfast.   vitamin C 1000 MG tablet Take by mouth.   VITAMIN D-1000 MAX ST 1000 units tablet Generic drug:  Cholecalciferol Take by mouth.        Allergies:  Allergies  Allergen Reactions  . Ciprofloxacin   . Clarithromycin   . Oxycodone   . Penicillins   . Sulfur     Family History: Family History  Problem Relation Age of Onset  . Diabetes Mother   . Prostate cancer Neg Hx   . Bladder Cancer Neg Hx     Social History:  reports that he has quit smoking. He has never used smokeless tobacco. He reports that he does not drink alcohol or use drugs.  ROS: UROLOGY Frequent Urination?: Yes Hard to postpone urination?: No Burning/pain with urination?: No Get up at night to urinate?: Yes Leakage of urine?: No Urine stream starts and stops?: Yes Trouble starting stream?: Yes Do you have to strain to urinate?: No Blood in urine?: Yes Urinary tract infection?: Yes Sexually transmitted disease?: No Injury to kidneys or bladder?: No Painful intercourse?: No Weak stream?: No Erection problems?: No Penile pain?: No  Gastrointestinal Nausea?: No Vomiting?: No Indigestion/heartburn?: No Diarrhea?: No Constipation?: No  Constitutional Fever: No Night sweats?: No Weight loss?: No Fatigue?: Yes  Skin Skin rash/lesions?: No Itching?: No  Eyes Blurred vision?: No Double vision?: No  Ears/Nose/Throat Sore throat?: No Sinus problems?: No  Hematologic/Lymphatic Swollen glands?: No Easy bruising?: No  Cardiovascular Leg swelling?: No Chest pain?: No  Respiratory Cough?: No Shortness of breath?: No  Endocrine Excessive thirst?: No  Musculoskeletal Back pain?: No Joint pain?: No  Neurological Headaches?: No Dizziness?: No  Psychologic Depression?: No Anxiety?: No  Physical Exam: BP (!) 176/73 (BP Location: Left Arm, Patient Position: Sitting, Cuff Size: Normal)   Pulse 81   Ht 6' (1.829 m)   Wt 165 lb (74.8 kg)   BMI 22.38 kg/m   Constitutional:  Well nourished. Alert and oriented, No acute distress. HEENT: [place] AT, moist mucus membranes.  Trachea midline, no masses. Cardiovascular: No  clubbing, cyanosis, or edema. Respiratory: Normal respiratory effort, no increased work of breathing. GI: Abdomen is soft, non tender, non distended, no abdominal masses. Liver and spleen not palpable.  No hernias appreciated.  Stool sample for occult testing is not indicated.  Ileostomy located in the right lower quadrant.   GU: No CVA tenderness.  No bladder fullness or masses.  Patient with circumcised phallus.  Urethral meatus is patent.  No penile discharge. No penile lesions or rashes. Scrotum without lesions, cysts, rashes and/or edema.  Testicles are located scrotally bilaterally. No masses are appreciated in the testicles. Left and right epididymis are normal. Skin: No rashes, bruises or suspicious lesions. Lymph: No cervical or inguinal adenopathy. Neurologic: Grossly intact, no focal deficits, moving all 4 extremities. Psychiatric:  Normal mood and affect.  Laboratory Data: Lab Results  Component Value Date   WBC 7.9 10/09/2017   HGB 14.7 10/09/2017   HCT 42.8 10/09/2017   MCV 88.2 10/09/2017   PLT 166 10/09/2017    Lab Results  Component Value Date   CREATININE 0.96 10/09/2017    No results found for: PSA  No results found for: TESTOSTERONE  No results found for: HGBA1C  No results found for: TSH     Component Value Date/Time   CHOL 181 03/14/2017 0806   HDL 43 03/14/2017 0806   CHOLHDL 4.2 03/14/2017 0806   VLDL 18 03/14/2017 0806   LDLCALC 120 (H) 03/14/2017 0806    Lab Results  Component Value Date   AST 25 03/14/2017   Lab Results  Component Value Date   ALT 23 03/14/2017   No components found for: ALKALINEPHOPHATASE No components found for: BILIRUBINTOTAL  No results found for: ESTRADIOL   Urinalysis > 30 RBC's.  See Epic.   I have reviewed the labs.  Assessment & Plan:    1. Gross hematuria Explained to the patient that there are a number of causes that can be associated with blood in the urine, such as stones, BPH, UTI's, damage to the  urinary tract and/or cancer. At this time, I felt that the patient warranted further urologic evaluation.   The AUA guidelines state that a CT urogram is the preferred imaging study to evaluate hematuria. I explained to the patient that a contrast material will be injected into a vein and that in rare instances, an allergic reaction can result and may even life threatening   The patient denies any allergies to contrast, iodine and/or seafood and is not taking metformin. Following the imaging study,  I've recommended a cystoscopy. I described how this is performed, typically in an office setting with a flexible cystoscope. We described the risks, benefits, and possible side effects, the most common of which is a minor amount of blood in the urine and/or burning which usually resolves in 24 to 48 hours.   The patient had the opportunity to ask questions which were answered. Based upon this discussion, the patient is willing to proceed. Therefore, I've ordered: a CT Urogram and cystoscopy.  - The patient will return following all of the above for discussion of the results.  - Urinalysis, Complete - CULTURE, URINE COMPREHENSIVE - BUN+Creat -He had discontinued his baby aspirin and will consult with Dr. Ubaldo Glassing to see if he can remain off the aspirin until his hematuria work-up is completed  2. BPH with incomplete emptying PVR minimal   Return for CT on 09/23 and cysto 09/27 with Dr. Erlene Quan.  These notes generated with voice recognition software. I apologize for typographical errors.  Zara Council, PA-C  [facility] [address] [place]  [place], [place] [address] [phone]

## 2017-10-14 LAB — URINALYSIS, COMPLETE
Bilirubin, UA: NEGATIVE
Glucose, UA: NEGATIVE
KETONES UA: NEGATIVE
LEUKOCYTES UA: NEGATIVE
Nitrite, UA: NEGATIVE
SPEC GRAV UA: 1.015 (ref 1.005–1.030)
Urobilinogen, Ur: 0.2 mg/dL (ref 0.2–1.0)
pH, UA: 5.5 (ref 5.0–7.5)

## 2017-10-14 LAB — MICROSCOPIC EXAMINATION
Bacteria, UA: NONE SEEN
EPITHELIAL CELLS (NON RENAL): NONE SEEN /HPF (ref 0–10)
RBC, UA: >30 /hpf — ABNORMAL HIGH (ref 0–2)
WBC UA: NONE SEEN /HPF (ref 0–5)

## 2017-10-14 LAB — BUN+CREAT
BUN / CREAT RATIO: 17 (ref 10–24)
BUN: 14 mg/dL (ref 8–27)
CREATININE: 0.83 mg/dL (ref 0.76–1.27)
GFR calc Af Amer: 95 mL/min/{1.73_m2} (ref >59)
GFR calc non Af Amer: 82 mL/min/{1.73_m2} (ref >59)

## 2017-10-16 LAB — CULTURE, URINE COMPREHENSIVE

## 2017-10-17 ENCOUNTER — Ambulatory Visit
Admission: RE | Admit: 2017-10-17 | Discharge: 2017-10-17 | Disposition: A | Discharge status: Home / Self Care | Payer: PPO | Source: Ambulatory Visit | Attending: Urology | Admitting: Urology

## 2017-10-17 DIAGNOSIS — R310 Gross hematuria: Secondary | ICD-10-CM

## 2017-10-17 DIAGNOSIS — N400 Enlarged prostate without lower urinary tract symptoms: Secondary | ICD-10-CM | POA: Diagnosis not present

## 2017-10-17 DIAGNOSIS — N200 Calculus of kidney: Secondary | ICD-10-CM | POA: Insufficient documentation

## 2017-10-17 DIAGNOSIS — I700 Atherosclerosis of aorta: Secondary | ICD-10-CM | POA: Diagnosis not present

## 2017-10-17 DIAGNOSIS — J439 Emphysema, unspecified: Secondary | ICD-10-CM | POA: Insufficient documentation

## 2017-10-17 DIAGNOSIS — N281 Cyst of kidney, acquired: Secondary | ICD-10-CM | POA: Insufficient documentation

## 2017-10-17 MED ORDER — IOPAMIDOL (ISOVUE-300) INJECTION 61%
125.0000 mL | Freq: Once | INTRAVENOUS | Status: AC | PRN
  Administered 2017-10-17: 125 mL via INTRAVENOUS

## 2017-10-21 ENCOUNTER — Encounter: Payer: Self-pay | Admitting: Urology

## 2017-10-21 ENCOUNTER — Ambulatory Visit (INDEPENDENT_AMBULATORY_CARE_PROVIDER_SITE_OTHER): Payer: PPO | Admitting: Urology

## 2017-10-21 ENCOUNTER — Ambulatory Visit: Payer: PPO | Admitting: Urology

## 2017-10-21 ENCOUNTER — Other Ambulatory Visit
Admission: RE | Admit: 2017-10-21 | Discharge: 2017-10-21 | Disposition: A | Discharge status: Home / Self Care | Payer: PPO | Source: Ambulatory Visit | Attending: Urology | Admitting: Urology

## 2017-10-21 ENCOUNTER — Other Ambulatory Visit: Payer: Self-pay

## 2017-10-21 VITALS — BP 134/65 | HR 70 | Ht 72.0 in | Wt 163.0 lb

## 2017-10-21 DIAGNOSIS — N401 Enlarged prostate with lower urinary tract symptoms: Secondary | ICD-10-CM

## 2017-10-21 DIAGNOSIS — R310 Gross hematuria: Secondary | ICD-10-CM | POA: Insufficient documentation

## 2017-10-21 DIAGNOSIS — R339 Retention of urine, unspecified: Secondary | ICD-10-CM

## 2017-10-21 DIAGNOSIS — N138 Other obstructive and reflux uropathy: Secondary | ICD-10-CM

## 2017-10-21 LAB — URINALYSIS, COMPLETE (UACMP) WITH MICROSCOPIC
BACTERIA UA: NONE SEEN
Bilirubin Urine: NEGATIVE
Glucose, UA: NEGATIVE mg/dL
Hgb urine dipstick: NEGATIVE
KETONES UR: NEGATIVE mg/dL
Leukocytes, UA: NEGATIVE
Nitrite: NEGATIVE
PROTEIN: NEGATIVE mg/dL
RBC / HPF: NONE SEEN RBC/hpf (ref 0–5)
Specific Gravity, Urine: 1.01 (ref 1.005–1.030)
Squamous Epithelial / LPF: NONE SEEN (ref 0–5)
pH: 5.5 (ref 5.0–8.0)

## 2017-10-21 NOTE — Progress Notes (Signed)
   10/21/17  CC:  Chief Complaint  Patient presents with  . Cysto    HPI: 84 year old male with massive BPH with an episode of painless gross hematuria with clots presenting today for cystoscopy for further evaluation.  He underwent CT urogram on 10/17/2017 which showed incidental bilateral nonobstructing stones.  He was also noted to have massive prostamegaly with prostate measuring 5.5 x 5.7 x 7.4 cm with median lobe hypertrophy.  Incidental multiple renal cysts are also visualized, none of which were suspicious.  There are no filling defects or any other concerning pathologic issues.   Blood pressure 134/65, pulse 70, height 6' (1.829 m), weight 163 lb (73.9 kg). NED. A&Ox3.   No respiratory distress   Abd soft, NT, ND Normal phallus with bilateral descended testicles  Cystoscopy Procedure Note  Patient identification was confirmed, informed consent was obtained, and patient was prepped using Betadine solution.  Lidocaine jelly was administered per urethral meatus.    Preoperative abx where received prior to procedure.     Pre-Procedure: - Inspection reveals a normal caliber ureteral meatus.  Procedure: The flexible cystoscope was introduced without difficulty - No urethral strictures/lesions are present. - Enlarged prostate severe trilobar coaptation, 6+ cm prostatic length - Elevated bladder neck - Bilateral ureteral orifices identified - Bladder mucosa  reveals no ulcers, tumors, or lesions - No bladder stones - Moderate/ severe trabeculation, saccules  Retroflexion shows large well-circumscribed median lobe  Post-Procedure: - Patient tolerated the procedure well  Notably, there is friable prostatic mucosa and bleeding with manipulation appreciated  Assessment/ Plan:  1. Gross hematuria Friable prep prostatic muosa, bleeding with manipulation, prostamegaly appreciated Likely source of gross hematuria CT urogram reviewed Continue finasteride which may help  with bleeding from prostate  2. BPH with obstruction/lower urinary tract symptoms Massive prostate with intravesical protrusion/median lobe and evidence of chronic outlet obstruction on cystoscopy Chronically on finasteride and Flomax Good candidate for discussion of outlet procedure, namely holmium laser enucleation of the prostate given the size of his gland He will return at her soonest available appointment to discuss this further  3. Incomplete bladder emptying History of elevated postvoid residuals May benefit as per discussion from #2   Return for next availible f/u in [place] to discuss HoLEP.  Hollice Espy, MD

## 2017-10-24 NOTE — Telephone Encounter (Signed)
error 

## 2017-11-02 ENCOUNTER — Ambulatory Visit: Payer: PPO | Admitting: Urology

## 2017-11-02 ENCOUNTER — Encounter: Payer: Self-pay | Admitting: Urology

## 2017-11-02 VITALS — BP 111/64 | HR 87 | Ht 72.0 in | Wt 166.8 lb

## 2017-11-02 DIAGNOSIS — N138 Other obstructive and reflux uropathy: Secondary | ICD-10-CM

## 2017-11-02 DIAGNOSIS — R339 Retention of urine, unspecified: Secondary | ICD-10-CM | POA: Diagnosis not present

## 2017-11-02 DIAGNOSIS — R310 Gross hematuria: Secondary | ICD-10-CM | POA: Diagnosis not present

## 2017-11-02 DIAGNOSIS — N401 Enlarged prostate with lower urinary tract symptoms: Secondary | ICD-10-CM

## 2017-11-02 NOTE — Progress Notes (Signed)
11/02/2017 4:40 PM   Todd Bennett 1932-10-23 [id number]  Referring provider: [name], MD [phone] [place], [place] [id number]  Chief Complaint  Patient presents with  . Advice Only    surgery consultation     HPI: 84 yo M with massive BPH with BOO and recent gross hematuria who presents today primarily to discuss options for an outlet procedure.  He has a personal history of BPH and postoperative urinary retention.  He also has a history of bladder stones and incomplete bladder emptying.  He does have significant urinary symptoms despite maximal medical therapy with Flomax/finasteride.  IPSS as below.  His symptoms are primarily obstructive in nature.  He did recently undergo evaluation for gross hematuria which was painless including CT urogram and cystoscopy.  This revealed significant prostamegaly with a gland measuring 5.5 x 5.7 x 7.4 cm (121 g) as well as median lobe hypertrophy.  Cystoscopy confirmed these findings with significant coaptation.  No other bladder pathology was identified.  He did have friable prostatic mucosa which bled with manipulation and likely the source of underlying gross hematuria episode.  Notably, he does have significant cardiac history status post placement of a cardiac stent in February 2019.  He is currently on aspirin and Plavix managed by Dr. Ubaldo Glassing.  IPSS    Row Name 11/02/17 0900         International Prostate Symptom Score   How often have you had the sensation of not emptying your bladder?  About half the time     How often have you had to urinate less than every two hours?  Less than half the time     How often have you found you stopped and started again several times when you urinated?  About half the time     How often have you found it difficult to postpone urination?  More than half the time     How often have you had a weak urinary stream?  About half the time     How often have you had to strain to start urination?  More  than half the time     How many times did you typically get up at night to urinate?  2 Times     Total IPSS Score  21       Quality of Life due to urinary symptoms   If you were to spend the rest of your life with your urinary condition just the way it is now how would you feel about that?  Unhappy        Score:  1-7 Mild 8-19 Moderate 20-35 Severe   PMH: Past Medical History:  Diagnosis Date  . Allergy   . Anxiety 06/17/2014  . Benign prostatic hyperplasia with urinary obstruction 06/17/2014  . Benign prostatic hypertrophy without urinary obstruction 06/20/2013  . Bladder calculi 04/26/2013  . Calculi, ureter 03/29/2013  . Calculus of kidney 02/25/2013  . Cataract   . Clinical depression 06/17/2014  . Degenerative disc disease, lumbar   . Frank hematuria 06/28/2013  . HLD (hyperlipidemia) 06/17/2014  . Spondylolysis     Surgical History: Past Surgical History:  Procedure Laterality Date  . CORONARY STENT INTERVENTION N/A 03/15/2017   Procedure: CORONARY STENT INTERVENTION;  Surgeon: Yolonda Kida, MD;  Location: [facility];  Service: Cardiovascular;  Laterality: N/A;  . HERNIA REPAIR  1993  . ILEOSTOMY    . LEFT HEART CATH AND CORONARY ANGIOGRAPHY N/A 03/15/2017  Procedure: LEFT HEART CATH AND CORONARY ANGIOGRAPHY;  Surgeon: Teodoro Spray, MD;  Location: [facility];  Service: Cardiovascular;  Laterality: N/A;  . LITHOTRIPSY  03/2013    Home Medications:  Allergies as of 11/02/2017      Reactions   Ciprofloxacin    Clarithromycin    Oxycodone    Penicillins    Sulfur       Medication List        Accurate as of 11/02/17  4:40 PM. Always use your most recent med list.          aspirin EC 81 MG tablet Take by mouth.   atorvastatin 40 MG tablet Commonly known as:  LIPITOR Take 1 tablet (40 mg total) by mouth daily at 6 PM.   CoQ-10 200 MG Caps Take 200 mg by mouth.   finasteride 5 MG tablet Commonly known as:  PROSCAR TAKE 1 TABLET  EVERY DAY   Fish Oil 1000 MG Caps Take by mouth daily.   FLUoxetine 20 MG capsule Commonly known as:  PROZAC Take 30 mg by mouth daily.   metoprolol tartrate 25 MG tablet Commonly known as:  LOPRESSOR Take 0.5 tablets (12.5 mg total) by mouth 2 (two) times daily.   metroNIDAZOLE 1 % gel Commonly known as:  METROGEL Apply topically.   MULTI-VITAMINS Tabs Take by mouth daily.   NONFORMULARY OR COMPOUNDED ITEM Apply 1-2 g topically 4 (four) times daily.   prednisoLONE acetate 1 % ophthalmic suspension Commonly known as:  PRED FORTE Place 1 drop into the right eye daily.   SHINGRIX injection Generic drug:  Zoster Vaccine Adjuvanted   tamsulosin 0.4 MG Caps capsule Commonly known as:  FLOMAX Take 0.4 mg by mouth daily.   ticagrelor 90 MG Tabs tablet Commonly known as:  BRILINTA Take 1 tablet (90 mg total) by mouth 2 (two) times daily.   Venlafaxine HCl 75 MG Tb24 Take 75 mg by mouth daily with breakfast.   vitamin C 1000 MG tablet Take by mouth.   VITAMIN D-1000 MAX ST 1000 units tablet Generic drug:  Cholecalciferol Take by mouth.       Allergies:  Allergies  Allergen Reactions  . Ciprofloxacin   . Clarithromycin   . Oxycodone   . Penicillins   . Sulfur     Family History: Family History  Problem Relation Age of Onset  . Diabetes Mother   . Prostate cancer Neg Hx   . Bladder Cancer Neg Hx     Social History:  reports that he has quit smoking. He has never used smokeless tobacco. He reports that he does not drink alcohol or use drugs.  ROS: UROLOGY Frequent Urination?: No Hard to postpone urination?: Yes Burning/pain with urination?: No Get up at night to urinate?: Yes Leakage of urine?: No Urine stream starts and stops?: Yes Trouble starting stream?: No Do you have to strain to urinate?: No Blood in urine?: Yes Urinary tract infection?: No Sexually transmitted disease?: No Injury to kidneys or bladder?: No Painful intercourse?: No Weak  stream?: No Erection problems?: Yes Penile pain?: No  Gastrointestinal Nausea?: No Vomiting?: No Indigestion/heartburn?: No Diarrhea?: No Constipation?: No  Constitutional Fever: No Night sweats?: No Weight loss?: No Fatigue?: No  Skin Skin rash/lesions?: No Itching?: No  Eyes Blurred vision?: No Double vision?: No  Ears/Nose/Throat Sore throat?: No Sinus problems?: No  Hematologic/Lymphatic Swollen glands?: No Easy bruising?: Yes  Cardiovascular Leg swelling?: No Chest pain?: No  Respiratory Cough?: No Shortness  of breath?: No  Endocrine Excessive thirst?: No  Musculoskeletal Back pain?: No Joint pain?: No  Neurological Headaches?: No Dizziness?: No  Psychologic Depression?: No Anxiety?: Yes  Physical Exam: BP 111/64 (BP Location: Left Arm, Patient Position: Sitting, Cuff Size: Large)   Pulse 87   Ht 6' (1.829 m)   Wt 166 lb 12.8 oz (75.7 kg)   BMI 22.62 kg/m   Constitutional:  Alert and oriented, No acute distress. HEENT: [place] AT, moist mucus membranes.  Trachea midline, no masses. Cardiovascular: No clubbing, cyanosis, or edema. Respiratory: Normal respiratory effort, no increased work of breathing. Skin: No rashes, bruises or suspicious lesions. Neurologic: Grossly intact, no focal deficits, moving all 4 extremities. Psychiatric: Normal mood and affect.  Laboratory Data: Lab Results  Component Value Date   WBC 7.9 10/09/2017   HGB 14.7 10/09/2017   HCT 42.8 10/09/2017   MCV 88.2 10/09/2017   PLT 166 10/09/2017    Lab Results  Component Value Date   CREATININE 0.83 10/13/2017    Urinalysis    Component Value Date/Time   COLORURINE YELLOW 10/21/2017 1351   APPEARANCEUR CLEAR 10/21/2017 1351   APPEARANCEUR Cloudy (A) 10/13/2017 1346   LABSPEC 1.010 10/21/2017 1351   LABSPEC 1.018 02/20/2013 0949   PHURINE 5.5 10/21/2017 1351   GLUCOSEU NEGATIVE 10/21/2017 1351   GLUCOSEU see comment 02/20/2013 0949   HGBUR NEGATIVE  10/21/2017 1351   BILIRUBINUR NEGATIVE 10/21/2017 1351   BILIRUBINUR Negative 10/13/2017 1346   BILIRUBINUR see comment 02/20/2013 0949   KETONESUR NEGATIVE 10/21/2017 1351   PROTEINUR NEGATIVE 10/21/2017 1351   NITRITE NEGATIVE 10/21/2017 1351   LEUKOCYTESUR NEGATIVE 10/21/2017 1351   LEUKOCYTESUR Negative 10/13/2017 1346   LEUKOCYTESUR see comment 02/20/2013 0949    Lab Results  Component Value Date   LABMICR See below: 10/13/2017   WBCUA None seen 10/13/2017   RBCUA >30 (H) 10/13/2017   LABEPIT None seen 10/13/2017   MUCUS Present (A) 10/13/2017   BACTERIA NONE SEEN 10/21/2017    Pertinent Imaging: No new interval imaging  Assessment & Plan:    1. BPH with obstruction/lower urinary tract symptoms Currently on finasteride and Flomax with refractory BPH symptoms as above History of incomplete bladder emptying Lengthy discussion today about the size of his gland as well as alternative treatment options as alternative to chronic pharmacotherapy Given the size of his gland, he would be a good candidate for holmium laser enucleation of the prostate versus robotic simple prostatectomy versus open some of prostatectomy.  He may be interested in coming laser enucleation of the prostate given the minimally invasive approach.  We discussed possible outcomes today including improvement of his obstructive voiding symptoms including straining to void as well as improved bladder emptying.    We discussed the common postoperative course following holep including need for overnight Foley catheter, temporary worsening of irritative voiding symptoms, and occasional stress incontinence which typically lasts up to 6 months but can persist. We discussed retrograde ejaculation and damage to surrounding structures including the urinary sphincter. Other uncommon complications including hematuria and urinary tract infection.  He understands all of the above.  Prior to booking any surgery, would need  cardiac clearance from Dr. Ubaldo Glassing and permission to stop his antiplatelet therapy.  In deciding whether or not to pursue outlet surgery, would have to ensure that these medications could be held safely.    He also has some concerns about being the primary caretaker for his wife for Parkinson's.  He like to go ahead  and pursue cardiac clearance he like to proceed.  This is a reasonable.   2. Incomplete bladder emptying Include bladder emptying and postoperative urinary retention Would likely benefit from #1  3. Gross hematuria Further episodes of gross hematuria Likely related to friable prostatic mucosa/prostamegaly   Hollice Espy, MD  [facility] [address], [place], [place] [address] [phone]   I spent 25 min with this patient of which greater than 50% was spent in counseling and coordination of care with the patient.

## 2017-11-15 ENCOUNTER — Telehealth: Payer: Self-pay | Admitting: Radiology

## 2017-11-15 NOTE — Telephone Encounter (Signed)
Per Dr Ubaldo Glassing, patient must remain on aspirin 81mg  & Plavix until February 2020. Advised patient to continue medications as instructed by Dr Ubaldo Glassing & to keep follow up appointment with Dr Erlene Quan scheduled 04/04/2018 with KUB prior to discuss further. He will be advised when to hold medications once a surgery date is confirmed. Questions answered. Patient voices understanding.  These instructions were mailed to patient along with an appointment reminder for follow up with Dr Erlene Quan in March 2020.

## 2017-11-24 DIAGNOSIS — Z1283 Encounter for screening for malignant neoplasm of skin: Secondary | ICD-10-CM | POA: Diagnosis not present

## 2017-11-24 DIAGNOSIS — L821 Other seborrheic keratosis: Secondary | ICD-10-CM | POA: Diagnosis not present

## 2017-11-24 DIAGNOSIS — D225 Melanocytic nevi of trunk: Secondary | ICD-10-CM | POA: Diagnosis not present

## 2017-11-24 DIAGNOSIS — L812 Freckles: Secondary | ICD-10-CM | POA: Diagnosis not present

## 2017-11-24 DIAGNOSIS — L570 Actinic keratosis: Secondary | ICD-10-CM | POA: Diagnosis not present

## 2017-11-24 DIAGNOSIS — L820 Inflamed seborrheic keratosis: Secondary | ICD-10-CM | POA: Diagnosis not present

## 2017-11-24 DIAGNOSIS — L578 Other skin changes due to chronic exposure to nonionizing radiation: Secondary | ICD-10-CM | POA: Diagnosis not present

## 2017-11-24 DIAGNOSIS — D1800 Hemangioma unspecified site: Secondary | ICD-10-CM | POA: Diagnosis not present

## 2017-11-24 DIAGNOSIS — Z85828 Personal history of other malignant neoplasm of skin: Secondary | ICD-10-CM | POA: Diagnosis not present

## 2017-11-24 DIAGNOSIS — D229 Melanocytic nevi, unspecified: Secondary | ICD-10-CM | POA: Diagnosis not present

## 2017-12-06 DIAGNOSIS — Z23 Encounter for immunization: Secondary | ICD-10-CM | POA: Diagnosis not present

## 2017-12-07 DIAGNOSIS — Z933 Colostomy status: Secondary | ICD-10-CM | POA: Diagnosis not present

## 2017-12-13 ENCOUNTER — Encounter: Payer: Self-pay | Admitting: Podiatry

## 2017-12-13 ENCOUNTER — Ambulatory Visit (INDEPENDENT_AMBULATORY_CARE_PROVIDER_SITE_OTHER): Payer: PPO

## 2017-12-13 ENCOUNTER — Other Ambulatory Visit: Payer: Self-pay | Admitting: Podiatry

## 2017-12-13 ENCOUNTER — Ambulatory Visit: Payer: PPO | Admitting: Podiatry

## 2017-12-13 DIAGNOSIS — M779 Enthesopathy, unspecified: Secondary | ICD-10-CM

## 2017-12-13 DIAGNOSIS — M778 Other enthesopathies, not elsewhere classified: Secondary | ICD-10-CM

## 2017-12-13 DIAGNOSIS — M79672 Pain in left foot: Secondary | ICD-10-CM

## 2017-12-13 MED ORDER — CELECOXIB 200 MG PO CAPS: 200.0000 mg | ORAL_CAPSULE | Freq: Two times a day (BID) | ORAL | 1 refills | Status: DC

## 2017-12-15 NOTE — Progress Notes (Signed)
   HPI: 84 year old male presenting today for follow up evaluation of left foot pain that has been ongoing for over a year. He reports continued pain with associated swelling. He reports some temporary relief after receiving the injection at the previous visit. Walking and bearing weight increases the pain. Patient is here for further evaluation and treatment.   Past Medical History:  Diagnosis Date  . Allergy   . Anxiety 06/17/2014  . Benign prostatic hyperplasia with urinary obstruction 06/17/2014  . Benign prostatic hypertrophy without urinary obstruction 06/20/2013  . Bladder calculi 04/26/2013  . Calculi, ureter 03/29/2013  . Calculus of kidney 02/25/2013  . Cataract   . Clinical depression 06/17/2014  . Degenerative disc disease, lumbar   . Frank hematuria 06/28/2013  . HLD (hyperlipidemia) 06/17/2014  . Spondylolysis      Physical Exam: General: The patient is alert and oriented x3 in no acute distress.  Dermatology: Skin is warm, dry and supple bilateral lower extremities. Negative for open lesions or macerations.  Vascular: Edema noted to the left foot. Palpable pedal pulses bilaterally. No erythema noted. Capillary refill within normal limits.  Neurological: Epicritic and protective threshold grossly intact bilaterally.   Musculoskeletal Exam: Pain with palpation to the left midfoot. Range of motion within normal limits to all pedal and ankle joints bilateral. Muscle strength 5/5 in all groups bilateral.   Radiographic Exam:  Normal osseous mineralization. Joint spaces preserved. No fracture/dislocation/boney destruction.    Assessment: 1. Midfoot capsulitis left  2. Left foot edema    Plan of Care:  1. Patient evaluated. X-Rays reviewed.  2. Injection of 0.5 mLs Celestone Soluspan injected into the left midfoot.  3. Recommended below the knee compression hose.  4. Prescription for Celebrex provided to patient.  5. Continue using custom orthotics.  6. Return to clinic in 8  weeks.       Edrick Kins, DPM [facility]  Dr. Edrick Kins, DPM    [address], [place] [address]                Office [phone]  Fax [phone]

## 2017-12-29 DIAGNOSIS — I2510 Atherosclerotic heart disease of native coronary artery without angina pectoris: Secondary | ICD-10-CM | POA: Diagnosis not present

## 2017-12-29 DIAGNOSIS — N401 Enlarged prostate with lower urinary tract symptoms: Secondary | ICD-10-CM | POA: Diagnosis not present

## 2017-12-29 DIAGNOSIS — M5442 Lumbago with sciatica, left side: Secondary | ICD-10-CM | POA: Diagnosis not present

## 2017-12-29 DIAGNOSIS — M5441 Lumbago with sciatica, right side: Secondary | ICD-10-CM | POA: Diagnosis not present

## 2017-12-29 DIAGNOSIS — E785 Hyperlipidemia, unspecified: Secondary | ICD-10-CM | POA: Diagnosis not present

## 2018-01-09 ENCOUNTER — Other Ambulatory Visit: Payer: Self-pay | Admitting: Podiatry

## 2018-01-09 ENCOUNTER — Other Ambulatory Visit: Payer: Self-pay | Admitting: Urology

## 2018-01-28 ENCOUNTER — Emergency Department
Admission: EM | Admit: 2018-01-28 | Discharge: 2018-01-28 | Disposition: A | Discharge status: Home / Self Care | Payer: PPO | Attending: Emergency Medicine | Admitting: Emergency Medicine

## 2018-01-28 ENCOUNTER — Other Ambulatory Visit: Payer: Self-pay

## 2018-01-28 ENCOUNTER — Encounter: Payer: Self-pay | Admitting: Emergency Medicine

## 2018-01-28 DIAGNOSIS — Z87891 Personal history of nicotine dependence: Secondary | ICD-10-CM | POA: Diagnosis not present

## 2018-01-28 DIAGNOSIS — W19XXXA Unspecified fall, initial encounter: Secondary | ICD-10-CM | POA: Diagnosis not present

## 2018-01-28 DIAGNOSIS — Z432 Encounter for attention to ileostomy: Secondary | ICD-10-CM | POA: Diagnosis not present

## 2018-01-28 DIAGNOSIS — Z79899 Other long term (current) drug therapy: Secondary | ICD-10-CM | POA: Insufficient documentation

## 2018-01-28 DIAGNOSIS — Z7982 Long term (current) use of aspirin: Secondary | ICD-10-CM | POA: Diagnosis not present

## 2018-01-28 DIAGNOSIS — I10 Essential (primary) hypertension: Secondary | ICD-10-CM | POA: Diagnosis not present

## 2018-01-28 DIAGNOSIS — K9419 Other complications of enterostomy: Secondary | ICD-10-CM | POA: Diagnosis present

## 2018-01-28 MED ORDER — ZINC OXIDE 40 % EX OINT
TOPICAL_OINTMENT | Freq: Once | CUTANEOUS | Status: AC
  Administered 2018-01-28: 23:00:00 via TOPICAL
  Filled 2018-01-28: qty 113

## 2018-01-28 NOTE — Discharge Instructions (Addendum)
Please seek medical attention for any high fevers, chest pain, shortness of breath, change in behavior, persistent vomiting, bloody stool or any other new or concerning symptoms.  

## 2018-01-28 NOTE — ED Triage Notes (Addendum)
Pt arrived via EMS from home at [facility] independent living; says he's had an ileostomy for 50 years and over the last few days the stoma is receding; says he had been able push around the area and get the stoma to come out some but then it recedes again; says no drainage issues but the skin is raw; pt says he's having difficulty getting his appliance to stay in place; denies abd pain

## 2018-01-28 NOTE — ED Provider Notes (Signed)
[facility] Emergency Department Provider Note   ____________________________________________   I have reviewed the triage vital signs and the nursing notes.   HISTORY  Chief Complaint ileostomy problems   History limited by: Not Limited   HPI Todd Bennett is a 85 y.o. male who presents to the emergency department today because of concerns for ileostomy problems.  He states that for the past few days he has noticed his stoma has been receding.  He states now he is having a hard time keeping the stoma bags attached to the skin.  He is complaining of some skin breakdown.  He denies any pain at the stoma.  He states he still having good output.  He has not has any nausea or vomiting.  He has never had similar symptoms to this.  Has noticed a slight knot in the skin to the right of the stoma.   Per medical record review patient has a history of ileostomy  Past Medical History:  Diagnosis Date  . Allergy   . Anxiety 06/17/2014  . Benign prostatic hyperplasia with urinary obstruction 06/17/2014  . Benign prostatic hypertrophy without urinary obstruction 06/20/2013  . Bladder calculi 04/26/2013  . Calculi, ureter 03/29/2013  . Calculus of kidney 02/25/2013  . Cataract   . Clinical depression 06/17/2014  . Degenerative disc disease, lumbar   . Frank hematuria 06/28/2013  . HLD (hyperlipidemia) 06/17/2014  . Spondylolysis     Patient Active Problem List   Diagnosis Date Noted  . NSTEMI (non-ST elevated myocardial infarction) ([place]) 03/14/2017  . Anxiety 06/17/2014  . Benign prostatic hyperplasia with urinary obstruction 06/17/2014  . Clinical depression 06/17/2014  . HLD (hyperlipidemia) 06/17/2014  . Frank hematuria 06/28/2013  . Benign prostatic hypertrophy without urinary obstruction 06/20/2013  . Bladder calculi 04/26/2013  . Calculi, ureter 03/29/2013  . Calculus of kidney 02/25/2013    Past Surgical History:  Procedure Laterality Date  . CORONARY STENT  INTERVENTION N/A 03/15/2017   Procedure: CORONARY STENT INTERVENTION;  Surgeon: Yolonda Kida, MD;  Location: [facility];  Service: Cardiovascular;  Laterality: N/A;  . HERNIA REPAIR  1993  . ILEOSTOMY    . LEFT HEART CATH AND CORONARY ANGIOGRAPHY N/A 03/15/2017   Procedure: LEFT HEART CATH AND CORONARY ANGIOGRAPHY;  Surgeon: Teodoro Spray, MD;  Location: [facility];  Service: Cardiovascular;  Laterality: N/A;  . LITHOTRIPSY  03/2013    Prior to Admission medications   Medication Sig Start Date End Date Taking? Authorizing Provider  Ascorbic Acid (VITAMIN C) 1000 MG tablet Take by mouth.   Yes [provider]  aspirin EC 81 MG tablet Take by mouth.   Yes [provider]  atorvastatin (LIPITOR) 40 MG tablet Take 1 tablet (40 mg total) by mouth daily at 6 PM. 03/16/17  Yes Epifanio Lesches, MD  celecoxib (CELEBREX) 200 MG capsule TAKE 1 CAPSULE BY MOUTH TWICE DAILY 01/10/18  Yes Edrick Kins, DPM  Cholecalciferol (VITAMIN D-1000 MAX ST) 1000 units tablet Take by mouth.   Yes [provider]  finasteride (PROSCAR) 5 MG tablet TAKE 1 TABLET BY MOUTH DAILY 01/10/18  Yes McGowan, Larene Beach A, PA-C  FLUoxetine (PROZAC) 20 MG capsule Take 30 mg by mouth daily.    Yes [provider]  metoprolol tartrate (LOPRESSOR) 25 MG tablet Take 0.5 tablets (12.5 mg total) by mouth 2 (two) times daily. 03/16/17  Yes Epifanio Lesches, MD  metroNIDAZOLE (METROGEL) 1 % gel Apply topically.  Yes [provider]  Multiple Vitamin (MULTI-VITAMINS) TABS Take by mouth daily.    Yes [provider]  NONFORMULARY OR COMPOUNDED ITEM Apply 1-2 g topically 4 (four) times daily. 10/24/15  Yes Edrick Kins, DPM  Omega-3 Fatty Acids (FISH OIL) 1000 MG CAPS Take by mouth daily.    Yes [provider]  prednisoLONE acetate (PRED FORTE) 1 % ophthalmic suspension Place 1 drop into the right eye daily.   Yes [provider]   tamsulosin (FLOMAX) 0.4 MG CAPS capsule Take 0.4 mg by mouth daily.    Yes [provider]  ticagrelor (BRILINTA) 90 MG TABS tablet Take 1 tablet (90 mg total) by mouth 2 (two) times daily. 03/16/17  Yes Epifanio Lesches, MD  Venlafaxine HCl 75 MG TB24 Take 75 mg by mouth daily with breakfast.    Yes [provider]    Allergies Ciprofloxacin; Clarithromycin; Oxycodone; Penicillins; and Sulfur  Family History  Problem Relation Age of Onset  . Diabetes Mother   . Prostate cancer Neg Hx   . Bladder Cancer Neg Hx     Social History Social History   Tobacco Use  . Smoking status: Former Research scientist (life sciences)  . Smokeless tobacco: Never Used  Substance Use Topics  . Alcohol use: No    Alcohol/week: 0.0 standard drinks  . Drug use: No    Review of Systems Constitutional: No fever/chills Eyes: No visual changes. ENT: No sore throat. Cardiovascular: Denies chest pain. Respiratory: Denies shortness of breath. Gastrointestinal: Positive for ileostomy problems Genitourinary: Negative for dysuria. Musculoskeletal: Negative for back pain. Skin: Positive for rash around site of stoma Neurological: Negative for headaches, focal weakness or numbness.  ____________________________________________   PHYSICAL EXAM:  VITAL SIGNS: ED Triage Vitals  Enc Vitals Group     BP 01/28/18 2201 (!) 167/77     Pulse Rate 01/28/18 2201 63     Resp 01/28/18 2201 17     Temp 01/28/18 2201 98.1 F (36.7 C)     Temp Source 01/28/18 2201 Oral     SpO2 01/28/18 2201 98 %     Weight 01/28/18 2207 166 lb (75.3 kg)     Height 01/28/18 2207 6' (1.829 m)     Head Circumference --      Peak Flow --      Pain Score 01/28/18 2204 0   Constitutional: Alert and oriented.  Eyes: Conjunctivae are normal.  ENT      Head: Normocephalic and atraumatic.      Nose: No congestion/rhinnorhea.      Mouth/Throat: Mucous membranes are moist.      Neck: No  stridor. Hematological/Lymphatic/Immunilogical: No cervical lymphadenopathy. Cardiovascular: Normal rate, regular rhythm.  No murmurs, rubs, or gallops.  Respiratory: Normal respiratory effort without tachypnea nor retractions. Breath sounds are clear and equal bilaterally. No wheezes/rales/rhonchi. Gastrointestinal: Soft and non tender. Ileostomy in right abdomen. No tenderness to palpation around the area.  Genitourinary: Deferred Musculoskeletal: Normal range of motion in all extremities. No lower extremity edema. Neurologic:  Normal speech and language. No gross focal neurologic deficits are appreciated.  Skin:  Rash around the ileostomy site Psychiatric: Mood and affect are normal. Speech and behavior are normal. Patient exhibits appropriate insight and judgment.  ____________________________________________    LABS (pertinent positives/negatives)  None  ____________________________________________   EKG  None  ____________________________________________    RADIOLOGY  None  ____________________________________________   PROCEDURES  Procedures  ____________________________________________   INITIAL IMPRESSION / ASSESSMENT AND PLAN / ED  COURSE  Pertinent labs & imaging results that were available during my care of the patient were reviewed by me and considered in my medical decision making (see chart for details).   Patient presented to the emergency department today because of concerns for issues with his ileostomy.  On exam is nontender.  At this point I do not think there is an obstruction.  Do not think there is any gross infection although he does have some skin breakdown around the site of the ileostomy.  He states he follows up with Dr. Erlene Quan with urology for this.  Discussed with patient importance of following up with urology.    ____________________________________________   FINAL CLINICAL IMPRESSION(S) / ED DIAGNOSES  Final diagnoses:   Encounter for attention to ileostomy [facility])     Note: This dictation was prepared with Dragon dictation. Any transcriptional errors that result from this process are unintentional     Nance Pear, MD 01/28/18 2250

## 2018-01-30 DIAGNOSIS — E785 Hyperlipidemia, unspecified: Secondary | ICD-10-CM | POA: Diagnosis not present

## 2018-01-30 DIAGNOSIS — H18421 Band keratopathy, right eye: Secondary | ICD-10-CM | POA: Diagnosis not present

## 2018-01-30 DIAGNOSIS — I2510 Atherosclerotic heart disease of native coronary artery without angina pectoris: Secondary | ICD-10-CM | POA: Diagnosis not present

## 2018-01-30 DIAGNOSIS — F419 Anxiety disorder, unspecified: Secondary | ICD-10-CM | POA: Diagnosis not present

## 2018-01-30 DIAGNOSIS — N401 Enlarged prostate with lower urinary tract symptoms: Secondary | ICD-10-CM | POA: Diagnosis not present

## 2018-01-30 DIAGNOSIS — H35371 Puckering of macula, right eye: Secondary | ICD-10-CM | POA: Diagnosis not present

## 2018-02-01 DIAGNOSIS — Z933 Colostomy status: Secondary | ICD-10-CM | POA: Diagnosis not present

## 2018-02-06 DIAGNOSIS — E785 Hyperlipidemia, unspecified: Secondary | ICD-10-CM | POA: Diagnosis not present

## 2018-02-06 DIAGNOSIS — I2510 Atherosclerotic heart disease of native coronary artery without angina pectoris: Secondary | ICD-10-CM | POA: Diagnosis not present

## 2018-02-07 ENCOUNTER — Ambulatory Visit: Payer: PPO | Admitting: Podiatry

## 2018-02-07 ENCOUNTER — Encounter: Payer: Self-pay | Admitting: Podiatry

## 2018-02-07 DIAGNOSIS — M778 Other enthesopathies, not elsewhere classified: Secondary | ICD-10-CM

## 2018-02-07 DIAGNOSIS — M779 Enthesopathy, unspecified: Secondary | ICD-10-CM | POA: Diagnosis not present

## 2018-02-08 ENCOUNTER — Other Ambulatory Visit: Payer: Self-pay | Admitting: Podiatry

## 2018-02-09 ENCOUNTER — Other Ambulatory Visit: Payer: Self-pay

## 2018-02-09 ENCOUNTER — Encounter (HOSPITAL_COMMUNITY): Payer: Self-pay

## 2018-02-09 ENCOUNTER — Inpatient Hospital Stay (HOSPITAL_COMMUNITY)
Admission: EM | Admit: 2018-02-09 | Discharge: 2018-02-15 | DRG: 908 | Disposition: A | Discharge status: Skilled Nursing Facility | Payer: PPO | Attending: General Surgery | Admitting: General Surgery

## 2018-02-09 ENCOUNTER — Emergency Department (HOSPITAL_COMMUNITY): Payer: PPO

## 2018-02-09 DIAGNOSIS — D62 Acute posthemorrhagic anemia: Secondary | ICD-10-CM | POA: Diagnosis present

## 2018-02-09 DIAGNOSIS — F329 Major depressive disorder, single episode, unspecified: Secondary | ICD-10-CM | POA: Diagnosis present

## 2018-02-09 DIAGNOSIS — Y92410 Unspecified street and highway as the place of occurrence of the external cause: Secondary | ICD-10-CM

## 2018-02-09 DIAGNOSIS — I959 Hypotension, unspecified: Secondary | ICD-10-CM | POA: Diagnosis present

## 2018-02-09 DIAGNOSIS — I252 Old myocardial infarction: Secondary | ICD-10-CM | POA: Diagnosis not present

## 2018-02-09 DIAGNOSIS — S7012XA Contusion of left thigh, initial encounter: Secondary | ICD-10-CM | POA: Diagnosis present

## 2018-02-09 DIAGNOSIS — T80818A Extravasation of other vesicant agent, initial encounter: Secondary | ICD-10-CM | POA: Diagnosis present

## 2018-02-09 DIAGNOSIS — Z932 Ileostomy status: Secondary | ICD-10-CM | POA: Diagnosis not present

## 2018-02-09 DIAGNOSIS — R52 Pain, unspecified: Secondary | ICD-10-CM | POA: Diagnosis not present

## 2018-02-09 DIAGNOSIS — Z87891 Personal history of nicotine dependence: Secondary | ICD-10-CM

## 2018-02-09 DIAGNOSIS — S3993XA Unspecified injury of pelvis, initial encounter: Secondary | ICD-10-CM | POA: Diagnosis not present

## 2018-02-09 DIAGNOSIS — H9192 Unspecified hearing loss, left ear: Secondary | ICD-10-CM | POA: Diagnosis present

## 2018-02-09 DIAGNOSIS — Z79899 Other long term (current) drug therapy: Secondary | ICD-10-CM

## 2018-02-09 DIAGNOSIS — M79605 Pain in left leg: Secondary | ICD-10-CM | POA: Diagnosis not present

## 2018-02-09 DIAGNOSIS — Z23 Encounter for immunization: Secondary | ICD-10-CM | POA: Diagnosis present

## 2018-02-09 DIAGNOSIS — S0083XA Contusion of other part of head, initial encounter: Secondary | ICD-10-CM | POA: Diagnosis not present

## 2018-02-09 DIAGNOSIS — Z882 Allergy status to sulfonamides status: Secondary | ICD-10-CM

## 2018-02-09 DIAGNOSIS — Z87442 Personal history of urinary calculi: Secondary | ICD-10-CM

## 2018-02-09 DIAGNOSIS — M79652 Pain in left thigh: Secondary | ICD-10-CM | POA: Diagnosis not present

## 2018-02-09 DIAGNOSIS — S79922A Unspecified injury of left thigh, initial encounter: Secondary | ICD-10-CM | POA: Diagnosis not present

## 2018-02-09 DIAGNOSIS — N400 Enlarged prostate without lower urinary tract symptoms: Secondary | ICD-10-CM | POA: Diagnosis present

## 2018-02-09 DIAGNOSIS — R402253 Coma scale, best verbal response, oriented, at hospital admission: Secondary | ICD-10-CM | POA: Diagnosis present

## 2018-02-09 DIAGNOSIS — S75002A Unspecified injury of femoral artery, left leg, initial encounter: Principal | ICD-10-CM | POA: Diagnosis present

## 2018-02-09 DIAGNOSIS — H5461 Unqualified visual loss, right eye, normal vision left eye: Secondary | ICD-10-CM | POA: Diagnosis present

## 2018-02-09 DIAGNOSIS — F419 Anxiety disorder, unspecified: Secondary | ICD-10-CM | POA: Diagnosis present

## 2018-02-09 DIAGNOSIS — G9341 Metabolic encephalopathy: Secondary | ICD-10-CM | POA: Diagnosis not present

## 2018-02-09 DIAGNOSIS — M25562 Pain in left knee: Secondary | ICD-10-CM | POA: Diagnosis not present

## 2018-02-09 DIAGNOSIS — Z833 Family history of diabetes mellitus: Secondary | ICD-10-CM | POA: Diagnosis not present

## 2018-02-09 DIAGNOSIS — Z7982 Long term (current) use of aspirin: Secondary | ICD-10-CM

## 2018-02-09 DIAGNOSIS — H6121 Impacted cerumen, right ear: Secondary | ICD-10-CM | POA: Diagnosis present

## 2018-02-09 DIAGNOSIS — S299XXA Unspecified injury of thorax, initial encounter: Secondary | ICD-10-CM | POA: Diagnosis not present

## 2018-02-09 DIAGNOSIS — I2510 Atherosclerotic heart disease of native coronary artery without angina pectoris: Secondary | ICD-10-CM | POA: Diagnosis present

## 2018-02-09 DIAGNOSIS — T1490XD Injury, unspecified, subsequent encounter: Secondary | ICD-10-CM | POA: Diagnosis not present

## 2018-02-09 DIAGNOSIS — Z7902 Long term (current) use of antithrombotics/antiplatelets: Secondary | ICD-10-CM

## 2018-02-09 DIAGNOSIS — M6281 Muscle weakness (generalized): Secondary | ICD-10-CM | POA: Diagnosis not present

## 2018-02-09 DIAGNOSIS — R278 Other lack of coordination: Secondary | ICD-10-CM | POA: Diagnosis not present

## 2018-02-09 DIAGNOSIS — S3991XA Unspecified injury of abdomen, initial encounter: Secondary | ICD-10-CM | POA: Diagnosis not present

## 2018-02-09 DIAGNOSIS — Z881 Allergy status to other antibiotic agents status: Secondary | ICD-10-CM | POA: Diagnosis not present

## 2018-02-09 DIAGNOSIS — S75092A Other specified injury of femoral artery, left leg, initial encounter: Secondary | ICD-10-CM | POA: Diagnosis not present

## 2018-02-09 DIAGNOSIS — S0990XA Unspecified injury of head, initial encounter: Secondary | ICD-10-CM | POA: Diagnosis not present

## 2018-02-09 DIAGNOSIS — R030 Elevated blood-pressure reading, without diagnosis of hypertension: Secondary | ICD-10-CM | POA: Diagnosis not present

## 2018-02-09 DIAGNOSIS — E785 Hyperlipidemia, unspecified: Secondary | ICD-10-CM | POA: Diagnosis present

## 2018-02-09 DIAGNOSIS — R402363 Coma scale, best motor response, obeys commands, at hospital admission: Secondary | ICD-10-CM | POA: Diagnosis present

## 2018-02-09 DIAGNOSIS — S0101XA Laceration without foreign body of scalp, initial encounter: Secondary | ICD-10-CM | POA: Diagnosis not present

## 2018-02-09 DIAGNOSIS — Z880 Allergy status to penicillin: Secondary | ICD-10-CM | POA: Diagnosis not present

## 2018-02-09 DIAGNOSIS — Z955 Presence of coronary angioplasty implant and graft: Secondary | ICD-10-CM | POA: Diagnosis not present

## 2018-02-09 DIAGNOSIS — T1490XA Injury, unspecified, initial encounter: Secondary | ICD-10-CM | POA: Diagnosis present

## 2018-02-09 DIAGNOSIS — Z9049 Acquired absence of other specified parts of digestive tract: Secondary | ICD-10-CM

## 2018-02-09 DIAGNOSIS — I10 Essential (primary) hypertension: Secondary | ICD-10-CM | POA: Diagnosis present

## 2018-02-09 DIAGNOSIS — R55 Syncope and collapse: Secondary | ICD-10-CM | POA: Diagnosis present

## 2018-02-09 DIAGNOSIS — R262 Difficulty in walking, not elsewhere classified: Secondary | ICD-10-CM | POA: Diagnosis not present

## 2018-02-09 DIAGNOSIS — R402143 Coma scale, eyes open, spontaneous, at hospital admission: Secondary | ICD-10-CM | POA: Diagnosis present

## 2018-02-09 DIAGNOSIS — Z741 Need for assistance with personal care: Secondary | ICD-10-CM | POA: Diagnosis not present

## 2018-02-09 DIAGNOSIS — Z48817 Encounter for surgical aftercare following surgery on the skin and subcutaneous tissue: Secondary | ICD-10-CM | POA: Diagnosis not present

## 2018-02-09 DIAGNOSIS — S8001XA Contusion of right knee, initial encounter: Secondary | ICD-10-CM | POA: Diagnosis not present

## 2018-02-09 LAB — BASIC METABOLIC PANEL
Anion gap: 10 (ref 5–15)
BUN: 19 mg/dL (ref 8–23)
CO2: 22 mmol/L (ref 22–32)
CREATININE: 1.1 mg/dL (ref 0.61–1.24)
Calcium: 8.2 mg/dL — ABNORMAL LOW (ref 8.9–10.3)
Chloride: 107 mmol/L (ref 98–111)
GFR calc Af Amer: >60 mL/min (ref >60)
GLUCOSE: 200 mg/dL — AB (ref 70–99)
POTASSIUM: 3.9 mmol/L (ref 3.5–5.1)
SODIUM: 139 mmol/L (ref 135–145)

## 2018-02-09 LAB — I-STAT CHEM 8, ED
BUN: 19 mg/dL (ref 8–23)
CALCIUM ION: 1.14 mmol/L — AB (ref 1.15–1.40)
Chloride: 105 mmol/L (ref 98–111)
Creatinine, Ser: 1 mg/dL (ref 0.61–1.24)
GLUCOSE: 197 mg/dL — AB (ref 70–99)
HCT: 32 % — ABNORMAL LOW (ref 39.0–52.0)
HEMOGLOBIN: 10.9 g/dL — AB (ref 13.0–17.0)
Potassium: 4 mmol/L (ref 3.5–5.1)
Sodium: 139 mmol/L (ref 135–145)
TCO2: 24 mmol/L (ref 22–32)

## 2018-02-09 LAB — URINALYSIS, ROUTINE W REFLEX MICROSCOPIC
BACTERIA UA: NONE SEEN
Bilirubin Urine: NEGATIVE
Glucose, UA: NEGATIVE mg/dL
KETONES UR: NEGATIVE mg/dL
Leukocytes, UA: NEGATIVE
Nitrite: NEGATIVE
PH: 5 (ref 5.0–8.0)
Protein, ur: NEGATIVE mg/dL
Specific Gravity, Urine: 1.011 (ref 1.005–1.030)

## 2018-02-09 LAB — CBC
HCT: 33.5 % — ABNORMAL LOW (ref 39.0–52.0)
Hemoglobin: 11 g/dL — ABNORMAL LOW (ref 13.0–17.0)
MCH: 28.9 pg (ref 26.0–34.0)
MCHC: 32.8 g/dL (ref 30.0–36.0)
MCV: 88.2 fL (ref 80.0–100.0)
Platelets: 171 10*3/uL (ref 150–400)
RBC: 3.8 MIL/uL — ABNORMAL LOW (ref 4.22–5.81)
RDW: 12.7 % (ref 11.5–15.5)
WBC: 18.1 10*3/uL — ABNORMAL HIGH (ref 4.0–10.5)
nRBC: 0 % (ref 0.0–0.2)

## 2018-02-09 LAB — ABO/RH: ABO/RH(D): O POS

## 2018-02-09 MED ORDER — LIDOCAINE-EPINEPHRINE (PF) 2 %-1:200000 IJ SOLN
20.0000 mL | Freq: Once | INTRAMUSCULAR | Status: DC
  Filled 2018-02-09: qty 20

## 2018-02-09 MED ORDER — SODIUM CHLORIDE 0.9 % IV BOLUS
1000.0000 mL | Freq: Once | INTRAVENOUS | Status: AC
  Administered 2018-02-09: 1000 mL via INTRAVENOUS

## 2018-02-09 MED ORDER — ACETAMINOPHEN 325 MG PO TABS
650.0000 mg | ORAL_TABLET | Freq: Once | ORAL | Status: AC
  Administered 2018-02-09: 650 mg via ORAL
  Filled 2018-02-09: qty 2

## 2018-02-09 MED ORDER — IOHEXOL 300 MG/ML  SOLN
100.0000 mL | Freq: Once | INTRAMUSCULAR | Status: AC | PRN
  Administered 2018-02-09: 100 mL via INTRAVENOUS

## 2018-02-09 NOTE — ED Notes (Signed)
Pt given sandwich and water to drink. Will ambulate after.
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
## 2018-02-09 NOTE — Progress Notes (Signed)
   HPI: 85 year old male presenting today for follow up evaluation of left foot pain. He states his pain has improved significantly since his previous visit. He denies any modifying factors. He has been taking Celebrex for treatment. Patient is here for further evaluation and treatment.   Past Medical History:  Diagnosis Date  . Allergy   . Anxiety 06/17/2014  . Benign prostatic hyperplasia with urinary obstruction 06/17/2014  . Benign prostatic hypertrophy without urinary obstruction 06/20/2013  . Bladder calculi 04/26/2013  . Calculi, ureter 03/29/2013  . Calculus of kidney 02/25/2013  . Cataract   . Clinical depression 06/17/2014  . Degenerative disc disease, lumbar   . Frank hematuria 06/28/2013  . HLD (hyperlipidemia) 06/17/2014  . Spondylolysis      Objective: Physical Exam General: The patient is alert and oriented x3 in no acute distress.  Dermatology: Skin is cool, dry and supple bilateral lower extremities. Negative for open lesions or macerations.  Vascular: Palpable pedal pulses bilaterally. No edema or erythema noted. Capillary refill within normal limits.  Neurological: Epicritic and protective threshold grossly intact bilaterally.   Musculoskeletal Exam: All pedal and ankle joints range of motion within normal limits bilateral. Muscle strength 5/5 in all groups bilateral.    Assessment: 1. Midfoot capsulitis left - resolved   Plan of Care:  1. Patient evaluated.  2. Continue taking Celebrex BID.  3. Recommended good shoe gear with custom orthotics.  4. Return to clinic as needed.       Edrick Kins, DPM [facility]  Dr. Edrick Kins, DPM    [address], [place] [address]                Office [phone]  Fax [phone]

## 2018-02-10 ENCOUNTER — Encounter (HOSPITAL_COMMUNITY): Payer: Self-pay | Admitting: Interventional Radiology

## 2018-02-10 ENCOUNTER — Emergency Department (HOSPITAL_COMMUNITY): Payer: PPO

## 2018-02-10 ENCOUNTER — Inpatient Hospital Stay (HOSPITAL_COMMUNITY): Payer: PPO

## 2018-02-10 DIAGNOSIS — Z880 Allergy status to penicillin: Secondary | ICD-10-CM | POA: Diagnosis not present

## 2018-02-10 DIAGNOSIS — T80818A Extravasation of other vesicant agent, initial encounter: Secondary | ICD-10-CM | POA: Diagnosis present

## 2018-02-10 DIAGNOSIS — Z932 Ileostomy status: Secondary | ICD-10-CM | POA: Diagnosis not present

## 2018-02-10 DIAGNOSIS — R402363 Coma scale, best motor response, obeys commands, at hospital admission: Secondary | ICD-10-CM | POA: Diagnosis present

## 2018-02-10 DIAGNOSIS — I2510 Atherosclerotic heart disease of native coronary artery without angina pectoris: Secondary | ICD-10-CM | POA: Diagnosis present

## 2018-02-10 DIAGNOSIS — Y92410 Unspecified street and highway as the place of occurrence of the external cause: Secondary | ICD-10-CM | POA: Diagnosis not present

## 2018-02-10 DIAGNOSIS — I959 Hypotension, unspecified: Secondary | ICD-10-CM | POA: Diagnosis present

## 2018-02-10 DIAGNOSIS — Z881 Allergy status to other antibiotic agents status: Secondary | ICD-10-CM | POA: Diagnosis not present

## 2018-02-10 DIAGNOSIS — F329 Major depressive disorder, single episode, unspecified: Secondary | ICD-10-CM | POA: Diagnosis present

## 2018-02-10 DIAGNOSIS — Z833 Family history of diabetes mellitus: Secondary | ICD-10-CM | POA: Diagnosis not present

## 2018-02-10 DIAGNOSIS — Z9049 Acquired absence of other specified parts of digestive tract: Secondary | ICD-10-CM | POA: Diagnosis not present

## 2018-02-10 DIAGNOSIS — S7012XA Contusion of left thigh, initial encounter: Secondary | ICD-10-CM | POA: Diagnosis present

## 2018-02-10 DIAGNOSIS — Z882 Allergy status to sulfonamides status: Secondary | ICD-10-CM | POA: Diagnosis not present

## 2018-02-10 DIAGNOSIS — Z87442 Personal history of urinary calculi: Secondary | ICD-10-CM | POA: Diagnosis not present

## 2018-02-10 DIAGNOSIS — H9192 Unspecified hearing loss, left ear: Secondary | ICD-10-CM | POA: Diagnosis present

## 2018-02-10 DIAGNOSIS — Z955 Presence of coronary angioplasty implant and graft: Secondary | ICD-10-CM | POA: Diagnosis not present

## 2018-02-10 DIAGNOSIS — M79605 Pain in left leg: Secondary | ICD-10-CM | POA: Diagnosis not present

## 2018-02-10 DIAGNOSIS — S75002A Unspecified injury of femoral artery, left leg, initial encounter: Secondary | ICD-10-CM | POA: Diagnosis present

## 2018-02-10 DIAGNOSIS — H6121 Impacted cerumen, right ear: Secondary | ICD-10-CM | POA: Diagnosis present

## 2018-02-10 DIAGNOSIS — T1490XA Injury, unspecified, initial encounter: Secondary | ICD-10-CM | POA: Diagnosis present

## 2018-02-10 DIAGNOSIS — Z87891 Personal history of nicotine dependence: Secondary | ICD-10-CM | POA: Diagnosis not present

## 2018-02-10 DIAGNOSIS — N400 Enlarged prostate without lower urinary tract symptoms: Secondary | ICD-10-CM | POA: Diagnosis present

## 2018-02-10 DIAGNOSIS — F419 Anxiety disorder, unspecified: Secondary | ICD-10-CM | POA: Diagnosis present

## 2018-02-10 DIAGNOSIS — E785 Hyperlipidemia, unspecified: Secondary | ICD-10-CM | POA: Diagnosis present

## 2018-02-10 DIAGNOSIS — Z23 Encounter for immunization: Secondary | ICD-10-CM | POA: Diagnosis present

## 2018-02-10 DIAGNOSIS — H5461 Unqualified visual loss, right eye, normal vision left eye: Secondary | ICD-10-CM | POA: Diagnosis present

## 2018-02-10 DIAGNOSIS — M25562 Pain in left knee: Secondary | ICD-10-CM | POA: Diagnosis not present

## 2018-02-10 DIAGNOSIS — I252 Old myocardial infarction: Secondary | ICD-10-CM | POA: Diagnosis not present

## 2018-02-10 DIAGNOSIS — D62 Acute posthemorrhagic anemia: Secondary | ICD-10-CM | POA: Diagnosis present

## 2018-02-10 HISTORY — PX: IR ANGIOGRAM SELECTIVE EACH ADDITIONAL VESSEL: IMG667

## 2018-02-10 HISTORY — PX: IR ANGIOGRAM EXTREMITY LEFT: IMG651

## 2018-02-10 HISTORY — PX: IR EMBO VENOUS NOT HEMORR HEMANG  INC GUIDE ROADMAPPING: IMG5447

## 2018-02-10 HISTORY — PX: IR US GUIDE VASC ACCESS RIGHT: IMG2390

## 2018-02-10 LAB — PROTIME-INR
INR: 1.23
Prothrombin Time: 15.4 seconds — ABNORMAL HIGH (ref 11.4–15.2)

## 2018-02-10 LAB — CBC
HCT: 27.4 % — ABNORMAL LOW (ref 39.0–52.0)
Hemoglobin: 9 g/dL — ABNORMAL LOW (ref 13.0–17.0)
MCH: 29 pg (ref 26.0–34.0)
MCHC: 32.8 g/dL (ref 30.0–36.0)
MCV: 88.4 fL (ref 80.0–100.0)
Platelets: 143 10*3/uL — ABNORMAL LOW (ref 150–400)
RBC: 3.1 MIL/uL — ABNORMAL LOW (ref 4.22–5.81)
RDW: 13.2 % (ref 11.5–15.5)
WBC: 10.9 10*3/uL — ABNORMAL HIGH (ref 4.0–10.5)
nRBC: 0 % (ref 0.0–0.2)

## 2018-02-10 LAB — MRSA PCR SCREENING: MRSA by PCR: NEGATIVE

## 2018-02-10 LAB — I-STAT CHEM 8, ED
BUN: 22 mg/dL (ref 8–23)
Calcium, Ion: 1.16 mmol/L (ref 1.15–1.40)
Chloride: 105 mmol/L (ref 98–111)
Creatinine, Ser: 0.9 mg/dL (ref 0.61–1.24)
Glucose, Bld: 217 mg/dL — ABNORMAL HIGH (ref 70–99)
HCT: 23 % — ABNORMAL LOW (ref 39.0–52.0)
Hemoglobin: 7.8 g/dL — ABNORMAL LOW (ref 13.0–17.0)
Potassium: 4.2 mmol/L (ref 3.5–5.1)
Sodium: 138 mmol/L (ref 135–145)
TCO2: 25 mmol/L (ref 22–32)

## 2018-02-10 LAB — PREPARE RBC (CROSSMATCH)

## 2018-02-10 MED ORDER — IOPAMIDOL (ISOVUE-300) INJECTION 61%
INTRAVENOUS | Status: AC
  Administered 2018-02-10: 65 mL
  Filled 2018-02-10: qty 200

## 2018-02-10 MED ORDER — SODIUM CHLORIDE 0.9 % IV SOLN
INTRAVENOUS | Status: AC | PRN
  Administered 2018-02-10: 10 mL/h via INTRAVENOUS

## 2018-02-10 MED ORDER — IOPAMIDOL (ISOVUE-370) INJECTION 76%
INTRAVENOUS | Status: AC
  Administered 2018-02-10: 100 mL
  Filled 2018-02-10: qty 100

## 2018-02-10 MED ORDER — METOPROLOL TARTRATE 25 MG PO TABS
12.5000 mg | ORAL_TABLET | Freq: Two times a day (BID) | ORAL | Status: DC
  Administered 2018-02-11 – 2018-02-15 (×9): 12.5 mg via ORAL
  Filled 2018-02-10 (×9): qty 1

## 2018-02-10 MED ORDER — FENTANYL CITRATE (PF) 100 MCG/2ML IJ SOLN: 25.0000 ug | INTRAMUSCULAR | Status: DC | PRN

## 2018-02-10 MED ORDER — FINASTERIDE 5 MG PO TABS
5.0000 mg | ORAL_TABLET | Freq: Every day | ORAL | Status: DC
  Administered 2018-02-10 – 2018-02-11 (×2): 5 mg via ORAL
  Filled 2018-02-10 (×3): qty 1

## 2018-02-10 MED ORDER — IOPAMIDOL (ISOVUE-370) INJECTION 76%: 80.0000 mL | Freq: Once | INTRAVENOUS | Status: DC | PRN

## 2018-02-10 MED ORDER — MIDAZOLAM HCL 2 MG/2ML IJ SOLN
INTRAMUSCULAR | Status: AC
  Filled 2018-02-10: qty 4

## 2018-02-10 MED ORDER — PANTOPRAZOLE SODIUM 40 MG IV SOLR: 40.0000 mg | Freq: Every day | INTRAVENOUS | Status: DC

## 2018-02-10 MED ORDER — SODIUM CHLORIDE 0.9% IV SOLUTION: Freq: Once | INTRAVENOUS | Status: DC

## 2018-02-10 MED ORDER — FLUOXETINE HCL 20 MG PO CAPS
30.0000 mg | ORAL_CAPSULE | Freq: Every day | ORAL | Status: DC
  Administered 2018-02-10 – 2018-02-15 (×6): 30 mg via ORAL
  Filled 2018-02-10 (×6): qty 1

## 2018-02-10 MED ORDER — PREDNISOLONE ACETATE 1 % OP SUSP
1.0000 [drp] | Freq: Every day | OPHTHALMIC | Status: DC
  Administered 2018-02-10 – 2018-02-15 (×6): 1 [drp] via OPHTHALMIC
  Filled 2018-02-10: qty 5

## 2018-02-10 MED ORDER — ONDANSETRON HCL 4 MG/2ML IJ SOLN: 4.0000 mg | Freq: Four times a day (QID) | INTRAMUSCULAR | Status: DC | PRN

## 2018-02-10 MED ORDER — PANTOPRAZOLE SODIUM 40 MG PO TBEC
40.0000 mg | DELAYED_RELEASE_TABLET | Freq: Every day | ORAL | Status: DC
  Administered 2018-02-10 – 2018-02-15 (×6): 40 mg via ORAL
  Filled 2018-02-10 (×6): qty 1

## 2018-02-10 MED ORDER — POTASSIUM CHLORIDE IN NACL 20-0.9 MEQ/L-% IV SOLN
INTRAVENOUS | Status: DC
  Administered 2018-02-10 – 2018-02-11 (×2): via INTRAVENOUS
  Filled 2018-02-10 (×3): qty 1000

## 2018-02-10 MED ORDER — SODIUM CHLORIDE 0.9 % IV BOLUS
1000.0000 mL | Freq: Once | INTRAVENOUS | Status: AC
  Administered 2018-02-10: 1000 mL via INTRAVENOUS

## 2018-02-10 MED ORDER — TAMSULOSIN HCL 0.4 MG PO CAPS
0.4000 mg | ORAL_CAPSULE | Freq: Every day | ORAL | Status: DC
  Administered 2018-02-10 – 2018-02-11 (×2): 0.4 mg via ORAL
  Filled 2018-02-10 (×3): qty 1

## 2018-02-10 MED ORDER — FENTANYL CITRATE (PF) 100 MCG/2ML IJ SOLN: 50.0000 ug | Freq: Once | INTRAMUSCULAR | Status: DC

## 2018-02-10 MED ORDER — SODIUM CHLORIDE 0.9 % IV BOLUS
500.0000 mL | Freq: Once | INTRAVENOUS | Status: AC
  Administered 2018-02-10: 500 mL via INTRAVENOUS

## 2018-02-10 MED ORDER — FENTANYL CITRATE (PF) 100 MCG/2ML IJ SOLN
INTRAMUSCULAR | Status: AC | PRN
  Administered 2018-02-10: 50 ug via INTRAVENOUS

## 2018-02-10 MED ORDER — LIDOCAINE HCL (PF) 1 % IJ SOLN
INTRAMUSCULAR | Status: AC | PRN
  Administered 2018-02-10: 10 mL

## 2018-02-10 MED ORDER — HEPARIN SODIUM (PORCINE) 1000 UNIT/ML IJ SOLN
INTRAMUSCULAR | Status: AC
  Filled 2018-02-10: qty 1

## 2018-02-10 MED ORDER — MIDAZOLAM HCL 2 MG/2ML IJ SOLN
INTRAMUSCULAR | Status: AC | PRN
  Administered 2018-02-10: 1 mg via INTRAVENOUS

## 2018-02-10 MED ORDER — TETANUS-DIPHTH-ACELL PERTUSSIS 5-2.5-18.5 LF-MCG/0.5 IM SUSP
0.5000 mL | Freq: Once | INTRAMUSCULAR | Status: AC
  Administered 2018-02-10: 0.5 mL via INTRAMUSCULAR
  Filled 2018-02-10: qty 0.5

## 2018-02-10 MED ORDER — ONDANSETRON 4 MG PO TBDP: 4.0000 mg | ORAL_TABLET | Freq: Four times a day (QID) | ORAL | Status: DC | PRN

## 2018-02-10 MED ORDER — LIDOCAINE HCL 1 % IJ SOLN
INTRAMUSCULAR | Status: AC
  Filled 2018-02-10: qty 20

## 2018-02-10 MED ORDER — FENTANYL CITRATE (PF) 100 MCG/2ML IJ SOLN
INTRAMUSCULAR | Status: AC
  Filled 2018-02-10: qty 4

## 2018-02-10 MED ORDER — VENLAFAXINE HCL ER 75 MG PO CP24
75.0000 mg | ORAL_CAPSULE | Freq: Every day | ORAL | Status: DC
  Administered 2018-02-11 – 2018-02-15 (×5): 75 mg via ORAL
  Filled 2018-02-10 (×5): qty 1

## 2018-02-10 NOTE — ED Notes (Signed)
Trauma MD at the bedside.

## 2018-02-10 NOTE — ED Notes (Signed)
Pt was able to sit on the side of the bed for several minutes, then stood up slowly. After about a minute, he became pale and said he felt weak, assisted him to sit on the side of the bed. B/P dropped, HR maintained. [facility] notified, 500 bolus ordered an encouraged hydration.
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
## 2018-02-10 NOTE — Procedures (Signed)
Interventional Radiology Procedure Note  Procedure: Left lower extremity arteriography with embolization of profunda femoral artery  Complications: None  Estimated Blood Loss: < 10 mL  Findings: Arteriography of left leg demonstrates active arterial extravasation and elongated pseudoaneurysm emanating from a posterolateral mid-thigh branch of the profunda femoral artery. Successfully embolized with coils. No further extravasation on completion arteriography.  Right CFA arteriotomy closed with Cordis ExoSeal.  [name] T. Kathlene Cote, M.D Pager:  [phone]

## 2018-02-10 NOTE — Progress Notes (Signed)
Status post embolization of left profunda branch Patient feels that his left leg is less painful. Ecchymosis to left thigh remains stable.  No obvious expansion of the hematoma.  No evidence of compartment syndrome.  Patient will need to be monitored for compartment syndrome.  Continue with serial hemoglobin checks and transfuse appropriately.  Todd Bennett
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
## 2018-02-10 NOTE — ED Notes (Signed)
Vascular surgeon at bedside.

## 2018-02-10 NOTE — Progress Notes (Signed)
Patient ID: Todd Bennett, male   DOB: [birth year], 85 y.o.   MRN: [id number] Successful angioembolization reviewed with Dr. Kathlene Cote. Appreciate IR help.  Georganna Skeans, MD, MPH, FACS Trauma: [phone] General Surgery: [phone]
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
## 2018-02-10 NOTE — ED Notes (Signed)
Attempted to ambulate patient. Patient became diaphoretic and felt lightheaded. Pt positioned back on stretcher and vitals rechecked. Vital signs normal. EDP notified and orders placed.

## 2018-02-10 NOTE — Progress Notes (Signed)
Patient ID: Todd Bennett, male   DOB: 06/28/1932, 85 y.o.   MRN: [id number] Hb 9 L thigh a little softer VSS I D/W Dr. Trula Slade - observe for now Start diet F/U CBC  Georganna Skeans, MD, MPH, [place] Trauma: [phone] General Surgery: [phone]

## 2018-02-11 LAB — CBC
HCT: 24.9 % — ABNORMAL LOW (ref 39.0–52.0)
HCT: 23.6 % — ABNORMAL LOW (ref 39.0–52.0)
HCT: 27.8 % — ABNORMAL LOW (ref 39.0–52.0)
HEMATOCRIT: 22.1 % — AB (ref 39.0–52.0)
HEMOGLOBIN: 7.3 g/dL — AB (ref 13.0–17.0)
Hemoglobin: 8.5 g/dL — ABNORMAL LOW (ref 13.0–17.0)
Hemoglobin: 7.8 g/dL — ABNORMAL LOW (ref 13.0–17.0)
Hemoglobin: 9.2 g/dL — ABNORMAL LOW (ref 13.0–17.0)
MCH: 30.5 pg (ref 26.0–34.0)
MCH: 29 pg (ref 26.0–34.0)
MCH: 29.2 pg (ref 26.0–34.0)
MCH: 28.5 pg (ref 26.0–34.0)
MCHC: 34.1 g/dL (ref 30.0–36.0)
MCHC: 33 g/dL (ref 30.0–36.0)
MCHC: 33.1 g/dL (ref 30.0–36.0)
MCHC: 33.1 g/dL (ref 30.0–36.0)
MCV: 89.2 fL (ref 80.0–100.0)
MCV: 87.7 fL (ref 80.0–100.0)
MCV: 88.4 fL (ref 80.0–100.0)
MCV: 86.1 fL (ref 80.0–100.0)
NRBC: 0 % (ref 0.0–0.2)
Platelets: 127 10*3/uL — ABNORMAL LOW (ref 150–400)
Platelets: 99 10*3/uL — ABNORMAL LOW (ref 150–400)
Platelets: 106 10*3/uL — ABNORMAL LOW (ref 150–400)
Platelets: 101 10*3/uL — ABNORMAL LOW (ref 150–400)
RBC: 2.79 MIL/uL — AB (ref 4.22–5.81)
RBC: 2.52 MIL/uL — ABNORMAL LOW (ref 4.22–5.81)
RBC: 2.67 MIL/uL — AB (ref 4.22–5.81)
RBC: 3.23 MIL/uL — AB (ref 4.22–5.81)
RDW: 13.3 % (ref 11.5–15.5)
RDW: 13.3 % (ref 11.5–15.5)
RDW: 13.3 % (ref 11.5–15.5)
RDW: 14.4 % (ref 11.5–15.5)
WBC: 9.9 10*3/uL (ref 4.0–10.5)
WBC: 7.8 10*3/uL (ref 4.0–10.5)
WBC: 9.2 10*3/uL (ref 4.0–10.5)
WBC: 7.9 10*3/uL (ref 4.0–10.5)
nRBC: 0 % (ref 0.0–0.2)
nRBC: 0 % (ref 0.0–0.2)
nRBC: 0 % (ref 0.0–0.2)

## 2018-02-11 LAB — BASIC METABOLIC PANEL
ANION GAP: 7 (ref 5–15)
BUN: 14 mg/dL (ref 8–23)
CO2: 22 mmol/L (ref 22–32)
Calcium: 7.6 mg/dL — ABNORMAL LOW (ref 8.9–10.3)
Chloride: 109 mmol/L (ref 98–111)
Creatinine, Ser: 0.73 mg/dL (ref 0.61–1.24)
GFR calc non Af Amer: >60 mL/min (ref >60)
Glucose, Bld: 133 mg/dL — ABNORMAL HIGH (ref 70–99)
Potassium: 3.7 mmol/L (ref 3.5–5.1)
Sodium: 138 mmol/L (ref 135–145)

## 2018-02-11 LAB — PREPARE RBC (CROSSMATCH)

## 2018-02-11 MED ORDER — SODIUM CHLORIDE 0.9% IV SOLUTION
Freq: Once | INTRAVENOUS | Status: AC
  Administered 2018-02-11: 12:00:00 via INTRAVENOUS

## 2018-02-11 NOTE — Progress Notes (Signed)
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 intramuscular artery. Completion arteriography after embolization demonstrates no further extravasation of contrast. IMPRESSION: Left lower extremity arteriography is positive for arterial injury to a posterolateral intramuscular branch of the left profunda femoral artery at the mid thigh level corresponding to the area of contrast extravasation by CTA. This was able to be approached with a microcatheter and  selectively embolized. Completion arteriography demonstrates no evidence of further contrast extravasation or pseudoaneurysm. Electronically Signed   By: Aletta Edouard M.D.   On: 02/10/2018 14:05    Labs:  CBC: Recent Labs    02/10/18 1204 02/10/18 2103 02/11/18 0457 02/11/18 1159  WBC 10.9* 9.9 7.8 9.2  HGB 9.0* 8.5* 7.3* 7.8*  HCT 27.4* 24.9* 22.1* 23.6*  PLT 143* 127* 99* 106*    COAGS: Recent Labs    03/14/17 0523 02/10/18 1204  INR 1.00 1.23  APTT 33  --     BMP: Recent Labs    03/16/17 0759 10/09/17 2203 10/13/17 1340 02/09/18 2115 02/09/18 2126 02/10/18 0448 02/11/18 0457  NA 138 136  --  139 139 138 138  K 3.8 3.9  --  3.9 4.0 4.2 3.7  CL 105 107  --  107 105 105 109  CO2 25 24  --  22  --   --  22  GLUCOSE 109* 108*  --  200* 197* 217* 133*  BUN 17 16 14 19 19 22 14   CALCIUM 8.9 8.8*  --  8.2*  --   --  7.6*  CREATININE 0.93 0.96 0.83 1.10 1.00 0.90 0.73  GFRNONAA >60 >60 82 >60  --   --  >60  GFRAA >60 >60 95 >60  --   --  >60    LIVER FUNCTION TESTS: Recent Labs    03/14/17 0523  BILITOT 0.9  AST 25  ALT 23  ALKPHOS 52  PROT 6.3*  ALBUMIN 3.9    Assessment and Plan:  Patient s/p left profunda femoral artery embolization 1/17 with Dr. Kathlene Cote - patient doing well overall, minimal pain now to left thigh. Right CFA puncture site unremarkable, no bleeding per RN.  Further plans per primary service. Please call IR with questions or concerns.   Electronically Signed: Joaquim Nam, PA-C 02/11/2018, 1:11 PM   I spent a total of 15 Minutes at the the patient's bedside AND on the patient's hospital floor or unit, greater than 50% of which was counseling/coordinating care for left profunda femoral artery embolization follow up.
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
## 2018-02-11 NOTE — Progress Notes (Signed)
    Subjective  -  Left thigh is less tender   Physical Exam:  No evidence of thigh compartment syndrome Ecchymosis to left thigh Thigh is soft Left foot is warm and well-perfused       Assessment/Plan:    Patient receiving 2 units of blood this morning.  I do not see any evidence of compartment syndrome.  No role for surgical decompression.  Wells Brabham 02/11/2018 11:47 AM --  Vitals:   02/11/18 0945 02/11/18 1000  BP: (!) 138/26 135/90  Pulse: 84 84  Resp:  17  Temp:    SpO2:  100%    Intake/Output Summary (Last 24 hours) at 02/11/2018 1147 Last data filed at 02/11/2018 1000 Gross per 24 hour  Intake 1116.5 ml  Output 1286 ml  Net -169.5 ml     Laboratory CBC    Component Value Date/Time   WBC 7.8 02/11/2018 0457   HGB 7.3 (L) 02/11/2018 0457   HGB 16.9 02/20/2013 0949   HCT 22.1 (L) 02/11/2018 0457   HCT 50.4 02/20/2013 0949   PLT 99 (L) 02/11/2018 0457   PLT 255 02/20/2013 0949    BMET    Component Value Date/Time   NA 138 02/11/2018 0457   NA 135 (L) 02/20/2013 0949   K 3.7 02/11/2018 0457   K 3.9 02/20/2013 0949   CL 109 02/11/2018 0457   CL 105 02/20/2013 0949   CO2 22 02/11/2018 0457   CO2 24 02/20/2013 0949   GLUCOSE 133 (H) 02/11/2018 0457   GLUCOSE 76 02/20/2013 0949   BUN 14 02/11/2018 0457   BUN 14 10/13/2017 1340   BUN 21 (H) 02/20/2013 0949   CREATININE 0.73 02/11/2018 0457   CREATININE 1.05 02/20/2013 0949   CALCIUM 7.6 (L) 02/11/2018 0457   CALCIUM 9.2 02/20/2013 0949   GFRNONAA >60 02/11/2018 0457   GFRNONAA >60 02/20/2013 0949   GFRAA >60 02/11/2018 0457   GFRAA >60 02/20/2013 0949    COAG Lab Results  Component Value Date   INR 1.23 02/10/2018   INR 1.00 03/14/2017   No results found for: PTT  Antibiotics Anti-infectives (From admission, onward)   None       V. Leia Alf, M.D. [facility] Office: [phone] Pager:  [phone]

## 2018-02-12 LAB — CBC WITH DIFFERENTIAL/PLATELET
Abs Immature Granulocytes: 0.04 10*3/uL (ref 0.00–0.07)
BASOS PCT: 1 %
Basophils Absolute: 0 10*3/uL (ref 0.0–0.1)
Eosinophils Absolute: 0.3 10*3/uL (ref 0.0–0.5)
Eosinophils Relative: 4 %
HEMATOCRIT: 26.4 % — AB (ref 39.0–52.0)
Hemoglobin: 8.9 g/dL — ABNORMAL LOW (ref 13.0–17.0)
Immature Granulocytes: 1 %
Lymphocytes Relative: 16 %
Lymphs Abs: 1.1 10*3/uL (ref 0.7–4.0)
MCH: 29.5 pg (ref 26.0–34.0)
MCHC: 33.7 g/dL (ref 30.0–36.0)
MCV: 87.4 fL (ref 80.0–100.0)
Monocytes Absolute: 0.7 10*3/uL (ref 0.1–1.0)
Monocytes Relative: 10 %
Neutro Abs: 4.6 10*3/uL (ref 1.7–7.7)
Neutrophils Relative %: 68 %
Platelets: 101 10*3/uL — ABNORMAL LOW (ref 150–400)
RBC: 3.02 MIL/uL — ABNORMAL LOW (ref 4.22–5.81)
RDW: 14.6 % (ref 11.5–15.5)
WBC: 6.7 10*3/uL (ref 4.0–10.5)
nRBC: 0 % (ref 0.0–0.2)

## 2018-02-12 LAB — TYPE AND SCREEN
ABO/RH(D): O POS
Antibody Screen: NEGATIVE
Unit division: 0
Unit division: 0
Unit division: 0

## 2018-02-12 LAB — BASIC METABOLIC PANEL
Anion gap: 7 (ref 5–15)
BUN: 12 mg/dL (ref 8–23)
CO2: 24 mmol/L (ref 22–32)
Calcium: 8.1 mg/dL — ABNORMAL LOW (ref 8.9–10.3)
Chloride: 107 mmol/L (ref 98–111)
Creatinine, Ser: 0.97 mg/dL (ref 0.61–1.24)
GFR calc Af Amer: >60 mL/min (ref >60)
Glucose, Bld: 111 mg/dL — ABNORMAL HIGH (ref 70–99)
Potassium: 3.9 mmol/L (ref 3.5–5.1)
Sodium: 138 mmol/L (ref 135–145)

## 2018-02-12 LAB — BPAM RBC
Blood Product Expiration Date: 202002132359
Blood Product Expiration Date: 202002182359
Blood Product Expiration Date: 202002182359
ISSUE DATE / TIME: 202001170845
ISSUE DATE / TIME: 202001181206
ISSUE DATE / TIME: 202001181451
Unit Type and Rh: 5100
Unit Type and Rh: 5100
Unit Type and Rh: 5100

## 2018-02-12 MED ORDER — TAMSULOSIN HCL 0.4 MG PO CAPS
0.4000 mg | ORAL_CAPSULE | Freq: Every day | ORAL | Status: DC
  Administered 2018-02-12 – 2018-02-14 (×3): 0.4 mg via ORAL
  Filled 2018-02-12 (×3): qty 1

## 2018-02-12 MED ORDER — FINASTERIDE 5 MG PO TABS
5.0000 mg | ORAL_TABLET | Freq: Every day | ORAL | Status: DC
  Administered 2018-02-12 – 2018-02-14 (×3): 5 mg via ORAL
  Filled 2018-02-12 (×4): qty 1

## 2018-02-12 NOTE — Progress Notes (Signed)
PT Cancellation Note  Patient Details Name: Todd Bennett MRN: [id number] DOB: January 03, 1933   Cancelled Treatment:    Reason Eval/Treat Not Completed: Active bedrest order   Will contact RN and MD to see if bedrest can be lifted to proceed with PT evaluation;   Roney Marion, [place] Pager [phone] Office [phone]    Colletta Maryland 02/12/2018, 7:58 AM
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
## 2018-02-13 LAB — CBC
HCT: 26.7 % — ABNORMAL LOW (ref 39.0–52.0)
Hemoglobin: 8.6 g/dL — ABNORMAL LOW (ref 13.0–17.0)
MCH: 28 pg (ref 26.0–34.0)
MCHC: 32.2 g/dL (ref 30.0–36.0)
MCV: 87 fL (ref 80.0–100.0)
PLATELETS: 116 10*3/uL — AB (ref 150–400)
RBC: 3.07 MIL/uL — ABNORMAL LOW (ref 4.22–5.81)
RDW: 14.6 % (ref 11.5–15.5)
WBC: 7 10*3/uL (ref 4.0–10.5)
nRBC: 0 % (ref 0.0–0.2)

## 2018-02-13 MED ORDER — SODIUM CHLORIDE 0.9% FLUSH: 3.0000 mL | INTRAVENOUS | Status: DC | PRN

## 2018-02-13 MED ORDER — SODIUM CHLORIDE 0.9% FLUSH
3.0000 mL | Freq: Two times a day (BID) | INTRAVENOUS | Status: DC
  Administered 2018-02-13 – 2018-02-14 (×2): 3 mL via INTRAVENOUS

## 2018-02-13 MED ORDER — SODIUM CHLORIDE 0.9 % IV SOLN: 250.0000 mL | INTRAVENOUS | Status: DC | PRN
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
## 2018-02-14 NOTE — Clinical Social Work Note (Signed)
Clinical Social Worker continuing to follow patient and family for support and discharge planning needs.  Patient unfortunately continuing to wait on a PASARR number in order to discharge.  Insurance authorization has been given and facility is prepared for patient admission once PASARR available.  Patient has made arrangements with brother in law for transportation.  CSW remains available for support and to facilitate patient discharge needs.  Todd Bennett, [place]
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
## 2018-02-15 DIAGNOSIS — Z741 Need for assistance with personal care: Secondary | ICD-10-CM | POA: Diagnosis not present

## 2018-02-15 DIAGNOSIS — F419 Anxiety disorder, unspecified: Secondary | ICD-10-CM | POA: Diagnosis not present

## 2018-02-15 DIAGNOSIS — S75092A Other specified injury of femoral artery, left leg, initial encounter: Secondary | ICD-10-CM | POA: Diagnosis not present

## 2018-02-15 DIAGNOSIS — F329 Major depressive disorder, single episode, unspecified: Secondary | ICD-10-CM | POA: Diagnosis not present

## 2018-02-15 DIAGNOSIS — N400 Enlarged prostate without lower urinary tract symptoms: Secondary | ICD-10-CM | POA: Diagnosis not present

## 2018-02-15 DIAGNOSIS — I2510 Atherosclerotic heart disease of native coronary artery without angina pectoris: Secondary | ICD-10-CM | POA: Diagnosis not present

## 2018-02-15 DIAGNOSIS — Z23 Encounter for immunization: Secondary | ICD-10-CM | POA: Diagnosis not present

## 2018-02-15 DIAGNOSIS — M6281 Muscle weakness (generalized): Secondary | ICD-10-CM | POA: Diagnosis not present

## 2018-02-15 DIAGNOSIS — D696 Thrombocytopenia, unspecified: Secondary | ICD-10-CM | POA: Diagnosis not present

## 2018-02-15 DIAGNOSIS — T1490XD Injury, unspecified, subsequent encounter: Secondary | ICD-10-CM | POA: Diagnosis not present

## 2018-02-15 DIAGNOSIS — Z932 Ileostomy status: Secondary | ICD-10-CM | POA: Diagnosis not present

## 2018-02-15 DIAGNOSIS — Y92410 Unspecified street and highway as the place of occurrence of the external cause: Secondary | ICD-10-CM | POA: Diagnosis not present

## 2018-02-15 DIAGNOSIS — R278 Other lack of coordination: Secondary | ICD-10-CM | POA: Diagnosis not present

## 2018-02-15 DIAGNOSIS — D62 Acute posthemorrhagic anemia: Secondary | ICD-10-CM | POA: Diagnosis not present

## 2018-02-15 DIAGNOSIS — F39 Unspecified mood [affective] disorder: Secondary | ICD-10-CM | POA: Diagnosis not present

## 2018-02-15 DIAGNOSIS — R52 Pain, unspecified: Secondary | ICD-10-CM | POA: Diagnosis not present

## 2018-02-15 DIAGNOSIS — Z48817 Encounter for surgical aftercare following surgery on the skin and subcutaneous tissue: Secondary | ICD-10-CM | POA: Diagnosis not present

## 2018-02-15 DIAGNOSIS — S7012XA Contusion of left thigh, initial encounter: Secondary | ICD-10-CM | POA: Diagnosis not present

## 2018-02-15 DIAGNOSIS — R262 Difficulty in walking, not elsewhere classified: Secondary | ICD-10-CM | POA: Diagnosis not present

## 2018-02-15 DIAGNOSIS — G9341 Metabolic encephalopathy: Secondary | ICD-10-CM | POA: Diagnosis not present

## 2018-02-15 MED ORDER — CLOPIDOGREL BISULFATE 75 MG PO TABS: 75.0000 mg | ORAL_TABLET | Freq: Every day | ORAL | Status: DC

## 2018-02-15 NOTE — Clinical Social Work Note (Signed)
Clinical Social Worker facilitated patient discharge including contacting patient family and facility to confirm patient discharge plans.  Clinical information faxed to facility and family agreeable with plan.  CSW arranged transport via family to [facility].  RN to call report prior to discharge.  Clinical Social Worker will sign off for now as social work intervention is no longer needed. Please consult [place] again if new need arises.  Barbette Or, [place]

## 2018-02-15 NOTE — Discharge Instructions (Signed)
1. PAIN CONTROL:  1. Pain is best controlled by a usual combination of three different methods TOGETHER:  i. Ice/Heat ii. Over the counter pain medication iii. Prescription pain medication 2. You may experience some swelling and bruising in area of wounds. Ice packs or heating pads (30-60 minutes up to 6 times a day) will help. Use ice for the first few days to help decrease swelling and bruising, then switch to heat to help relax tight/sore spots and speed recovery. Some people prefer to use ice alone, heat alone, alternating between ice & heat. Experiment to what works for you. Swelling and bruising can take several weeks to resolve.  3. It is helpful to take an over-the-counter pain medication regularly for the first few weeks. Choose one of the following that works best for you:  i. Naproxen (Aleve, etc) Two 220mg  tabs twice a day ii. Ibuprofen (Advil, etc) Three 200mg  tabs four times a day (every meal & bedtime) iii. Acetaminophen (Tylenol, etc) 500-650mg  four times a day (every meal & bedtime) 4. A prescription for pain medication (such as oxycodone, hydrocodone, etc) may be given to you upon discharge. Take your pain medication as prescribed.  i. If you are having problems/concerns with the prescription medicine (does not control pain, nausea, vomiting, rash, itching, etc), please call us [phone] to see if we need to switch you to a different pain medicine that will work better for you and/or control your side effect better. ii. If you need a refill on your pain medication, please contact your pharmacy. They will contact our office to request authorization. Prescriptions will not be filled after 5 pm or on week-ends. 1. Avoid getting constipated. When taking pain medications, it is common to experience some constipation. Increasing fluid intake and taking a fiber supplement (such as Metamucil, Citrucel, FiberCon, MiraLax, etc) 1-2 times a day regularly will usually help prevent this  problem from occurring. A mild laxative (prune juice, Milk of Magnesia, MiraLax, etc) should be taken according to package directions if there are no bowel movements after 48 hours.  2. Watch out for diarrhea. If you have many loose bowel movements, simplify your diet to bland foods & liquids for a few days. Stop any stool softeners and decrease your fiber supplement. Switching to mild anti-diarrheal medications (Kayopectate, Pepto Bismol) can help. If this worsens or does not improve, please call us. 3. Shower daily but do not bathe until your wounds heal. Cover your wounds with clean gauze and tape after showering.  4. FOLLOW UP  a. If a follow up appointment is needed one will be scheduled for you. If none is needed with our trauma team, please follow up with your primary care provider within 2-3 weeks from discharge. Please call [facility] at [phone] if you have any questions about follow up.   WHEN TO CALL us [phone]:  1. Poor pain control 2. Reactions / problems with new medications (rash/itching, nausea, etc)  3. Fever over 101.5 F (38.5 C) 4. Worsening swelling or bruising 5. Redness, swelling, foul discharge or increased pain from wounds 6. Productive cough, difficulty breathing 7. Increased swelling, pain, redness, numbness/tingling of left leg or other concerning symptoms  The clinic staff is available to answer your questions during regular business hours (8:30am-5pm). Please dont hesitate to call and ask to speak to one of our nurses for clinical concerns.  If you have a medical emergency, go to the nearest emergency room or call 911.  A surgeon  from [facility] is always on call at the [facility], [place], [place], [place], [place] [address] ?  MAIN: [phone] ? TOLL FREE: [phone] ?  FAX [phone]  [email or web address]
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
## 2018-02-15 NOTE — Clinical Social Work Placement (Signed)
   CLINICAL SOCIAL WORK PLACEMENT  NOTE  Date:  02/15/2018  Patient Details  Name: Todd Bennett MRN: [id number] Date of Birth: [birth year]  Clinical Social Work is seeking post-discharge placement for this patient at the [place] level of care (*CSW will initial, date and re-position this form in  chart as items are completed):  Yes   Patient/family provided with [facility] Work Department's list of facilities offering this level of care within the geographic area requested by the patient (or if unable, by the patient's family).  Yes   Patient/family informed of their freedom to choose among providers that offer the needed level of care, that participate in Medicare, Medicaid or managed care program needed by the patient, have an available bed and are willing to accept the patient.  Yes   Patient/family informed of [facility]'s ownership interest in [facility] and [facility], as well as of the fact that they are under no obligation to receive care at these facilities.  PASRR submitted to EDS on 02/13/18     PASRR number received on 02/15/18     Existing PASRR number confirmed on       FL2 transmitted to all facilities in geographic area requested by pt/family on 02/13/18     FL2 transmitted to all facilities within larger geographic area on       Patient informed that his/her managed care company has contracts with or will negotiate with certain facilities, including the following:        Yes   Patient/family informed of bed offers received.  Patient chooses bed at [facility]     Physician recommends and patient chooses bed at      Patient to be transferred to [facility] on 02/15/18.  Patient to be transferred to facility by [facility]     Patient family notified on 02/15/18 of transfer.  Name of family member notified:  Patient to notify family     PHYSICIAN Please prepare priority discharge summary, including  medications     Additional Comment:   Barbette Or, [place]

## 2018-02-16 DIAGNOSIS — I2510 Atherosclerotic heart disease of native coronary artery without angina pectoris: Secondary | ICD-10-CM | POA: Diagnosis not present

## 2018-02-16 DIAGNOSIS — Z932 Ileostomy status: Secondary | ICD-10-CM | POA: Diagnosis not present

## 2018-02-16 DIAGNOSIS — D696 Thrombocytopenia, unspecified: Secondary | ICD-10-CM | POA: Diagnosis not present

## 2018-02-16 DIAGNOSIS — N400 Enlarged prostate without lower urinary tract symptoms: Secondary | ICD-10-CM | POA: Diagnosis not present

## 2018-02-16 DIAGNOSIS — F39 Unspecified mood [affective] disorder: Secondary | ICD-10-CM | POA: Diagnosis not present

## 2018-02-16 DIAGNOSIS — S7012XA Contusion of left thigh, initial encounter: Secondary | ICD-10-CM | POA: Diagnosis not present

## 2018-03-01 DIAGNOSIS — F419 Anxiety disorder, unspecified: Secondary | ICD-10-CM | POA: Diagnosis not present

## 2018-03-01 DIAGNOSIS — R4189 Other symptoms and signs involving cognitive functions and awareness: Secondary | ICD-10-CM | POA: Diagnosis not present

## 2018-03-01 DIAGNOSIS — R278 Other lack of coordination: Secondary | ICD-10-CM | POA: Diagnosis not present

## 2018-03-01 DIAGNOSIS — Z48817 Encounter for surgical aftercare following surgery on the skin and subcutaneous tissue: Secondary | ICD-10-CM | POA: Diagnosis not present

## 2018-03-01 DIAGNOSIS — R262 Difficulty in walking, not elsewhere classified: Secondary | ICD-10-CM | POA: Diagnosis not present

## 2018-03-01 DIAGNOSIS — Z741 Need for assistance with personal care: Secondary | ICD-10-CM | POA: Diagnosis not present

## 2018-03-17 DIAGNOSIS — J029 Acute pharyngitis, unspecified: Secondary | ICD-10-CM | POA: Diagnosis not present

## 2018-03-21 DIAGNOSIS — J0100 Acute maxillary sinusitis, unspecified: Secondary | ICD-10-CM | POA: Diagnosis not present

## 2018-04-04 ENCOUNTER — Ambulatory Visit: Payer: PPO | Admitting: Urology

## 2018-05-30 DIAGNOSIS — I2510 Atherosclerotic heart disease of native coronary artery without angina pectoris: Secondary | ICD-10-CM | POA: Diagnosis not present

## 2018-05-30 DIAGNOSIS — F419 Anxiety disorder, unspecified: Secondary | ICD-10-CM | POA: Diagnosis not present

## 2018-05-30 DIAGNOSIS — E785 Hyperlipidemia, unspecified: Secondary | ICD-10-CM | POA: Diagnosis not present

## 2018-06-13 ENCOUNTER — Encounter: Payer: Self-pay | Admitting: Podiatry

## 2018-06-13 ENCOUNTER — Ambulatory Visit: Payer: PPO | Admitting: Podiatry

## 2018-06-13 ENCOUNTER — Other Ambulatory Visit: Payer: Self-pay

## 2018-06-13 VITALS — Temp 97.5°F

## 2018-06-13 DIAGNOSIS — M79676 Pain in unspecified toe(s): Secondary | ICD-10-CM

## 2018-06-13 DIAGNOSIS — B351 Tinea unguium: Secondary | ICD-10-CM | POA: Diagnosis not present

## 2018-06-15 NOTE — Progress Notes (Signed)
   SUBJECTIVE Patient presents to office today complaining of elongated, thickened nails that cause pain while ambulating in shoes. He is unable to trim his own nails. Patient is here for further evaluation and treatment.  Past Medical History:  Diagnosis Date  . Allergy   . Anxiety 06/17/2014  . Benign prostatic hyperplasia with urinary obstruction 06/17/2014  . Benign prostatic hypertrophy without urinary obstruction 06/20/2013  . Bladder calculi 04/26/2013  . Calculi, ureter 03/29/2013  . Calculus of kidney 02/25/2013  . Cataract   . Clinical depression 06/17/2014  . Degenerative disc disease, lumbar   . Frank hematuria 06/28/2013  . HLD (hyperlipidemia) 06/17/2014  . Hx of basal cell carcinoma    multiple sites  . Hx of squamous cell carcinoma    multiple sites  . Spondylolysis     OBJECTIVE General Patient is awake, alert, and oriented x 3 and in no acute distress. Derm Skin is dry and supple bilateral. Negative open lesions or macerations. Remaining integument unremarkable. Nails are tender, long, thickened and dystrophic with subungual debris, consistent with onychomycosis, 1-5 bilateral. No signs of infection noted. Vasc  DP and PT pedal pulses palpable bilaterally. Temperature gradient within normal limits.  Neuro Epicritic and protective threshold sensation grossly intact bilaterally.  Musculoskeletal Exam No symptomatic pedal deformities noted bilateral. Muscular strength within normal limits.  ASSESSMENT 1. Onychodystrophic nails 1-5 bilateral with hyperkeratosis of nails.  2. Onychomycosis of nail due to dermatophyte bilateral 3. Pain in foot bilateral  PLAN OF CARE 1. Patient evaluated today.  2. Instructed to maintain good pedal hygiene and foot care.  3. Mechanical debridement of nails 1-5 bilaterally performed using a nail nipper. Filed with dremel without incident.  4. Return to clinic in 3 mos.    Edrick Kins, DPM [facility]  Dr. Edrick Kins,  [place]                                        [place],  [address]                Office [phone]  Fax [phone]

## 2018-06-26 DIAGNOSIS — Z933 Colostomy status: Secondary | ICD-10-CM | POA: Diagnosis not present

## 2018-07-01 ENCOUNTER — Other Ambulatory Visit: Payer: Self-pay | Admitting: Podiatry

## 2018-08-02 ENCOUNTER — Other Ambulatory Visit: Payer: Self-pay | Admitting: Urology

## 2018-08-15 DIAGNOSIS — H35372 Puckering of macula, left eye: Secondary | ICD-10-CM | POA: Diagnosis not present

## 2018-08-28 DIAGNOSIS — Z933 Colostomy status: Secondary | ICD-10-CM | POA: Diagnosis not present

## 2018-09-12 ENCOUNTER — Ambulatory Visit: Payer: PPO | Admitting: Podiatry

## 2018-09-12 ENCOUNTER — Encounter: Payer: Self-pay | Admitting: Podiatry

## 2018-09-12 ENCOUNTER — Other Ambulatory Visit: Payer: Self-pay

## 2018-09-12 VITALS — Temp 98.2°F

## 2018-09-12 DIAGNOSIS — B351 Tinea unguium: Secondary | ICD-10-CM

## 2018-09-12 DIAGNOSIS — M79676 Pain in unspecified toe(s): Secondary | ICD-10-CM

## 2018-09-12 DIAGNOSIS — L989 Disorder of the skin and subcutaneous tissue, unspecified: Secondary | ICD-10-CM | POA: Diagnosis not present

## 2018-09-13 DIAGNOSIS — F419 Anxiety disorder, unspecified: Secondary | ICD-10-CM | POA: Diagnosis not present

## 2018-09-13 DIAGNOSIS — N401 Enlarged prostate with lower urinary tract symptoms: Secondary | ICD-10-CM | POA: Diagnosis not present

## 2018-09-13 DIAGNOSIS — Z0000 Encounter for general adult medical examination without abnormal findings: Secondary | ICD-10-CM | POA: Diagnosis not present

## 2018-09-14 NOTE — Progress Notes (Signed)
    Subjective: Patient is a 85 y.o. male presenting to the office today with a chief complaint of painful callus lesion(s) noted to the right foot that has been present for the past few months. Walking and bearing weight increases the pain. He has not had any treatment for the symptoms.  Patient also complains of elongated, thickened nails that cause pain while ambulating in shoes. He is unable to trim his own nails. Patient presents today for further treatment and evaluation.  Past Medical History:  Diagnosis Date  . Allergy   . Anxiety 06/17/2014  . Benign prostatic hyperplasia with urinary obstruction 06/17/2014  . Benign prostatic hypertrophy without urinary obstruction 06/20/2013  . Bladder calculi 04/26/2013  . Calculi, ureter 03/29/2013  . Calculus of kidney 02/25/2013  . Cataract   . Clinical depression 06/17/2014  . Degenerative disc disease, lumbar   . Frank hematuria 06/28/2013  . HLD (hyperlipidemia) 06/17/2014  . Hx of basal cell carcinoma    multiple sites  . Hx of squamous cell carcinoma    multiple sites  . Spondylolysis     Objective:  Physical Exam General: Alert and oriented x3 in no acute distress  Dermatology: Hyperkeratotic lesion(s) present on the right foot x 1. Pain on palpation with a central nucleated core noted. Skin is warm, dry and supple bilateral lower extremities. Negative for open lesions or macerations. Nails are tender, long, thickened and dystrophic with subungual debris, consistent with onychomycosis, 1-5 bilateral. No signs of infection noted.  Vascular: Palpable pedal pulses bilaterally. No edema or erythema noted. Capillary refill within normal limits.  Neurological: Epicritic and protective threshold grossly intact bilaterally.   Musculoskeletal Exam: Pain on palpation at the keratotic lesion(s) noted. Range of motion within normal limits bilateral. Muscle strength 5/5 in all groups bilateral.  Assessment: 1. Onychodystrophic nails 1-5 bilateral  with hyperkeratosis of nails.  2. Onychomycosis of nail due to dermatophyte bilateral 3. Pre-ulcerative callus lesion noted to the right foot x 1   Plan of Care:  1. Patient evaluated. 2. Excisional debridement of keratoic lesion(s) using a chisel blade was performed without incident.  3. Dressed with light dressing. 4. Mechanical debridement of nails 1-5 bilaterally performed using a nail nipper. Filed with dremel without incident.  5. Patient is to return to the clinic in 3 months.   Edrick Kins, DPM [facility]  Dr. Edrick Kins, [address]                                        [place], [place] [address]                Office [phone]  Fax [phone]

## 2018-09-18 DIAGNOSIS — F329 Major depressive disorder, single episode, unspecified: Secondary | ICD-10-CM | POA: Diagnosis not present

## 2018-09-18 DIAGNOSIS — F419 Anxiety disorder, unspecified: Secondary | ICD-10-CM | POA: Diagnosis not present

## 2018-09-18 DIAGNOSIS — I2510 Atherosclerotic heart disease of native coronary artery without angina pectoris: Secondary | ICD-10-CM | POA: Diagnosis not present

## 2018-09-18 DIAGNOSIS — M5441 Lumbago with sciatica, right side: Secondary | ICD-10-CM | POA: Diagnosis not present

## 2018-09-18 DIAGNOSIS — Z0000 Encounter for general adult medical examination without abnormal findings: Secondary | ICD-10-CM | POA: Diagnosis not present

## 2018-09-18 DIAGNOSIS — E785 Hyperlipidemia, unspecified: Secondary | ICD-10-CM | POA: Diagnosis not present

## 2018-09-18 DIAGNOSIS — M5442 Lumbago with sciatica, left side: Secondary | ICD-10-CM | POA: Diagnosis not present

## 2018-10-28 ENCOUNTER — Other Ambulatory Visit: Payer: Self-pay | Admitting: Podiatry

## 2018-11-30 DIAGNOSIS — E785 Hyperlipidemia, unspecified: Secondary | ICD-10-CM | POA: Diagnosis not present

## 2018-11-30 DIAGNOSIS — I2510 Atherosclerotic heart disease of native coronary artery without angina pectoris: Secondary | ICD-10-CM | POA: Diagnosis not present

## 2018-12-01 DIAGNOSIS — M1611 Unilateral primary osteoarthritis, right hip: Secondary | ICD-10-CM | POA: Diagnosis not present

## 2018-12-01 DIAGNOSIS — M79604 Pain in right leg: Secondary | ICD-10-CM | POA: Diagnosis not present

## 2018-12-01 DIAGNOSIS — M1711 Unilateral primary osteoarthritis, right knee: Secondary | ICD-10-CM | POA: Diagnosis not present

## 2018-12-05 ENCOUNTER — Encounter: Payer: Self-pay | Admitting: Podiatry

## 2018-12-05 ENCOUNTER — Other Ambulatory Visit: Payer: Self-pay

## 2018-12-05 ENCOUNTER — Ambulatory Visit: Payer: PPO | Admitting: Podiatry

## 2018-12-05 DIAGNOSIS — L989 Disorder of the skin and subcutaneous tissue, unspecified: Secondary | ICD-10-CM | POA: Diagnosis not present

## 2018-12-05 DIAGNOSIS — B351 Tinea unguium: Secondary | ICD-10-CM

## 2018-12-05 DIAGNOSIS — M79676 Pain in unspecified toe(s): Secondary | ICD-10-CM

## 2018-12-07 NOTE — Progress Notes (Signed)
    Subjective: Patient is a 85 y.o. male presenting to the office today for follow up evaluation of painful callus lesion(s) noted to the right foot. He states the callus needs to be trimmed down again because it causes pain with weightbearing. He has not had any recent treatment for the symptoms.  Patient also complains of elongated, thickened nails that cause pain while ambulating in shoes. He is unable to trim his own nails. Patient presents today for further treatment and evaluation.  Past Medical History:  Diagnosis Date  . Allergy   . Anxiety 06/17/2014  . Benign prostatic hyperplasia with urinary obstruction 06/17/2014  . Benign prostatic hypertrophy without urinary obstruction 06/20/2013  . Bladder calculi 04/26/2013  . Calculi, ureter 03/29/2013  . Calculus of kidney 02/25/2013  . Cataract   . Clinical depression 06/17/2014  . Degenerative disc disease, lumbar   . Frank hematuria 06/28/2013  . HLD (hyperlipidemia) 06/17/2014  . Hx of basal cell carcinoma    multiple sites  . Hx of squamous cell carcinoma    multiple sites  . Spondylolysis     Objective:  Physical Exam General: Alert and oriented x3 in no acute distress  Dermatology: Hyperkeratotic lesion(s) present on the right sub-first MPJ. Pain on palpation with a central nucleated core noted. Skin is warm, dry and supple bilateral lower extremities. Negative for open lesions or macerations. Nails are tender, long, thickened and dystrophic with subungual debris, consistent with onychomycosis, 1-5 bilateral. No signs of infection noted.  Vascular: Palpable pedal pulses bilaterally. No edema or erythema noted. Capillary refill within normal limits.  Neurological: Epicritic and protective threshold grossly intact bilaterally.   Musculoskeletal Exam: Pain on palpation at the keratotic lesion(s) noted. Range of motion within normal limits bilateral. Muscle strength 5/5 in all groups bilateral.  Assessment: 1. Onychodystrophic nails  1-5 bilateral with hyperkeratosis of nails.  2. Onychomycosis of nail due to dermatophyte bilateral 3. Pre-ulcerative callus lesion noted to the sub-1st MPJ right    Plan of Care:  1. Patient evaluated. 2. Excisional debridement of keratoic lesion(s) using a chisel blade was performed without incident.  3. Dressed with light dressing. 4. Mechanical debridement of nails 1-5 bilaterally performed using a nail nipper. Filed with dremel without incident.  5. Patient is to return to the clinic in 3 months.   Edrick Kins, DPM [facility]  Dr. Edrick Kins, [address]                                        [place], [place] [address]                Office [phone]  Fax [phone]

## 2018-12-12 DIAGNOSIS — Z933 Colostomy status: Secondary | ICD-10-CM | POA: Diagnosis not present

## 2018-12-13 DIAGNOSIS — L578 Other skin changes due to chronic exposure to nonionizing radiation: Secondary | ICD-10-CM | POA: Diagnosis not present

## 2018-12-13 DIAGNOSIS — Z1283 Encounter for screening for malignant neoplasm of skin: Secondary | ICD-10-CM | POA: Diagnosis not present

## 2018-12-13 DIAGNOSIS — L820 Inflamed seborrheic keratosis: Secondary | ICD-10-CM | POA: Diagnosis not present

## 2018-12-13 DIAGNOSIS — D229 Melanocytic nevi, unspecified: Secondary | ICD-10-CM | POA: Diagnosis not present

## 2018-12-13 DIAGNOSIS — D2230 Melanocytic nevi of unspecified part of face: Secondary | ICD-10-CM | POA: Diagnosis not present

## 2018-12-13 DIAGNOSIS — Z85828 Personal history of other malignant neoplasm of skin: Secondary | ICD-10-CM | POA: Diagnosis not present

## 2018-12-13 DIAGNOSIS — L570 Actinic keratosis: Secondary | ICD-10-CM | POA: Diagnosis not present

## 2018-12-13 DIAGNOSIS — D225 Melanocytic nevi of trunk: Secondary | ICD-10-CM | POA: Diagnosis not present

## 2019-01-22 DIAGNOSIS — F329 Major depressive disorder, single episode, unspecified: Secondary | ICD-10-CM | POA: Diagnosis not present

## 2019-01-22 DIAGNOSIS — F419 Anxiety disorder, unspecified: Secondary | ICD-10-CM | POA: Diagnosis not present

## 2019-01-24 ENCOUNTER — Other Ambulatory Visit: Payer: Self-pay | Admitting: Podiatry

## 2019-02-20 ENCOUNTER — Other Ambulatory Visit: Payer: Self-pay

## 2019-02-20 ENCOUNTER — Ambulatory Visit: Payer: PPO | Admitting: Podiatry

## 2019-02-20 DIAGNOSIS — M79676 Pain in unspecified toe(s): Secondary | ICD-10-CM

## 2019-02-20 DIAGNOSIS — B351 Tinea unguium: Secondary | ICD-10-CM

## 2019-02-20 DIAGNOSIS — L989 Disorder of the skin and subcutaneous tissue, unspecified: Secondary | ICD-10-CM

## 2019-02-22 NOTE — Progress Notes (Signed)
    Subjective: Patient is a 86 y.o. male presenting to the office today for follow up evaluation of painful callus lesion(s) noted to the right foot. Walking and bearing weight increases the pain. He has not done anything at home for treatment.   Patient also complains of elongated, thickened nails that cause pain while ambulating in shoes. He is unable to trim his own nails. Patient presents today for further treatment and evaluation.  Past Medical History:  Diagnosis Date  . Allergy   . Anxiety 06/17/2014  . Benign prostatic hyperplasia with urinary obstruction 06/17/2014  . Benign prostatic hypertrophy without urinary obstruction 06/20/2013  . Bladder calculi 04/26/2013  . Calculi, ureter 03/29/2013  . Calculus of kidney 02/25/2013  . Cataract   . Clinical depression 06/17/2014  . Degenerative disc disease, lumbar   . Frank hematuria 06/28/2013  . HLD (hyperlipidemia) 06/17/2014  . Hx of basal cell carcinoma    multiple sites  . Hx of squamous cell carcinoma    multiple sites  . Spondylolysis     Objective:  Physical Exam General: Alert and oriented x3 in no acute distress  Dermatology: Hyperkeratotic lesion(s) present on the right sub-first MPJ. Pain on palpation with a central nucleated core noted. Skin is warm, dry and supple bilateral lower extremities. Negative for open lesions or macerations. Nails are tender, long, thickened and dystrophic with subungual debris, consistent with onychomycosis, 1-5 bilateral. No signs of infection noted.  Vascular: Palpable pedal pulses bilaterally. No edema or erythema noted. Capillary refill within normal limits.  Neurological: Epicritic and protective threshold grossly intact bilaterally.   Musculoskeletal Exam: Pain on palpation at the keratotic lesion(s) noted. Range of motion within normal limits bilateral. Muscle strength 5/5 in all groups bilateral.  Assessment: 1. Onychodystrophic nails 1-5 bilateral with hyperkeratosis of nails.  2.  Onychomycosis of nail due to dermatophyte bilateral 3. Pre-ulcerative callus lesion noted to the sub-1st MPJ right    Plan of Care:  1. Patient evaluated. 2. Excisional debridement of keratoic lesion(s) using a chisel blade was performed without incident.  3. Dressed with light dressing. 4. Mechanical debridement of nails 1-5 bilaterally performed using a nail nipper. Filed with dremel without incident.  5. Patient is to return to the clinic in 3 months.   Edrick Kins, DPM [facility]  Dr. Edrick Kins, [address]                                        [place], [place] [address]                Office [phone]  Fax [phone]

## 2019-03-21 DIAGNOSIS — L820 Inflamed seborrheic keratosis: Secondary | ICD-10-CM | POA: Diagnosis not present

## 2019-03-21 DIAGNOSIS — L578 Other skin changes due to chronic exposure to nonionizing radiation: Secondary | ICD-10-CM | POA: Diagnosis not present

## 2019-03-21 DIAGNOSIS — L719 Rosacea, unspecified: Secondary | ICD-10-CM | POA: Diagnosis not present

## 2019-03-21 DIAGNOSIS — L570 Actinic keratosis: Secondary | ICD-10-CM | POA: Diagnosis not present

## 2019-03-21 DIAGNOSIS — L821 Other seborrheic keratosis: Secondary | ICD-10-CM | POA: Diagnosis not present

## 2019-04-09 DIAGNOSIS — Z933 Colostomy status: Secondary | ICD-10-CM | POA: Diagnosis not present

## 2019-04-23 ENCOUNTER — Ambulatory Visit: Payer: PPO

## 2019-04-23 ENCOUNTER — Other Ambulatory Visit: Payer: Self-pay

## 2019-04-23 DIAGNOSIS — F329 Major depressive disorder, single episode, unspecified: Secondary | ICD-10-CM | POA: Diagnosis not present

## 2019-04-23 DIAGNOSIS — L570 Actinic keratosis: Secondary | ICD-10-CM

## 2019-04-23 DIAGNOSIS — E785 Hyperlipidemia, unspecified: Secondary | ICD-10-CM | POA: Diagnosis not present

## 2019-04-23 DIAGNOSIS — I2510 Atherosclerotic heart disease of native coronary artery without angina pectoris: Secondary | ICD-10-CM | POA: Diagnosis not present

## 2019-04-23 MED ORDER — AMINOLEVULINIC ACID HCL 20 % EX SOLR
1.0000 "application " | Freq: Once | CUTANEOUS | Status: AC
  Administered 2019-04-23: 354 mg via TOPICAL

## 2019-04-23 NOTE — Patient Instructions (Signed)

## 2019-04-23 NOTE — Progress Notes (Signed)
Patient completed PDT therapy today.  AK (actinic keratosis) Scalp  Photodynamic therapy - Scalp Procedure discussed: discussed risks, benefits, side effects. and alternatives   Prep: site scrubbed/prepped with acetone   Location:  Scalp and forehead Number of lesions:  Multiple Type of treatment:  Blue light Aminolevulinic Acid (see MAR for details): Levulan Number of Levulan sticks used:  1 Incubation time (minutes):  120 Number of minutes under lamp:  16 Number of seconds under lamp:  40 Cooling:  Floor fan Post-procedure details: sunscreen applied    Aminolevulinic Acid HCl 20 % SOLR 354 mg - Scalp

## 2019-04-24 ENCOUNTER — Other Ambulatory Visit: Payer: Self-pay | Admitting: Podiatry

## 2019-04-24 ENCOUNTER — Other Ambulatory Visit: Payer: Self-pay | Admitting: Urology

## 2019-04-25 DIAGNOSIS — H18421 Band keratopathy, right eye: Secondary | ICD-10-CM | POA: Diagnosis not present

## 2019-05-10 ENCOUNTER — Other Ambulatory Visit: Payer: Self-pay | Admitting: Dermatology

## 2019-05-22 ENCOUNTER — Ambulatory Visit: Payer: PPO | Admitting: Podiatry

## 2019-05-25 ENCOUNTER — Ambulatory Visit: Payer: PPO | Admitting: Podiatry

## 2019-05-31 DIAGNOSIS — F419 Anxiety disorder, unspecified: Secondary | ICD-10-CM | POA: Diagnosis not present

## 2019-05-31 DIAGNOSIS — E785 Hyperlipidemia, unspecified: Secondary | ICD-10-CM | POA: Diagnosis not present

## 2019-05-31 DIAGNOSIS — I2510 Atherosclerotic heart disease of native coronary artery without angina pectoris: Secondary | ICD-10-CM | POA: Diagnosis not present

## 2019-05-31 DIAGNOSIS — F329 Major depressive disorder, single episode, unspecified: Secondary | ICD-10-CM | POA: Diagnosis not present

## 2019-06-01 ENCOUNTER — Other Ambulatory Visit: Payer: Self-pay

## 2019-06-01 ENCOUNTER — Ambulatory Visit: Payer: PPO | Admitting: Podiatry

## 2019-06-01 DIAGNOSIS — B351 Tinea unguium: Secondary | ICD-10-CM | POA: Diagnosis not present

## 2019-06-01 DIAGNOSIS — M79676 Pain in unspecified toe(s): Secondary | ICD-10-CM

## 2019-06-01 DIAGNOSIS — L989 Disorder of the skin and subcutaneous tissue, unspecified: Secondary | ICD-10-CM

## 2019-06-06 NOTE — Progress Notes (Signed)
    Subjective: Patient is a 86 y.o. male presenting to the office today for follow up evaluation of painful callus lesion(s) noted to the right foot. Walking and bearing weight increases the pain. He has not done anything at home for treatment.   Patient also complains of elongated, thickened nails that cause pain while ambulating in shoes. He is unable to trim his own nails. Patient presents today for further treatment and evaluation.  Past Medical History:  Diagnosis Date  . Allergy   . Anxiety 06/17/2014  . Benign prostatic hyperplasia with urinary obstruction 06/17/2014  . Benign prostatic hypertrophy without urinary obstruction 06/20/2013  . Bladder calculi 04/26/2013  . Calculi, ureter 03/29/2013  . Calculus of kidney 02/25/2013  . Cataract   . Clinical depression 06/17/2014  . Degenerative disc disease, lumbar   . Frank hematuria 06/28/2013  . HLD (hyperlipidemia) 06/17/2014  . Hx of basal cell carcinoma    multiple sites  . Hx of squamous cell carcinoma    multiple sites  . Spondylolysis     Objective:  Physical Exam General: Alert and oriented x3 in no acute distress  Dermatology: Hyperkeratotic lesion(s) present on the right sub-first MPJ. Pain on palpation with a central nucleated core noted. Skin is warm, dry and supple bilateral lower extremities. Negative for open lesions or macerations. Nails are tender, long, thickened and dystrophic with subungual debris, consistent with onychomycosis, 1-5 bilateral. No signs of infection noted.  Vascular: Palpable pedal pulses bilaterally. No edema or erythema noted. Capillary refill within normal limits.  Neurological: Epicritic and protective threshold grossly intact bilaterally.   Musculoskeletal Exam: Pain on palpation at the keratotic lesion(s) noted. Range of motion within normal limits bilateral. Muscle strength 5/5 in all groups bilateral.  Assessment: 1. Onychodystrophic nails 1-5 bilateral with hyperkeratosis of nails.  2.  Onychomycosis of nail due to dermatophyte bilateral 3. Pre-ulcerative callus lesion noted to the sub-1st MPJ right    Plan of Care:  1. Patient evaluated. 2. Excisional debridement of keratoic lesion(s) using a chisel blade was performed without incident.  3. Dressed with light dressing. 4. Mechanical debridement of nails 1-5 bilaterally performed using a nail nipper. Filed with dremel without incident.  5. Patient is to return to the clinic in 3 months.   Edrick Kins, DPM [facility]  Dr. Edrick Kins, [address]                                        [place], [place] [address]                Office [phone]  Fax [phone]

## 2019-06-16 ENCOUNTER — Other Ambulatory Visit: Payer: Self-pay | Admitting: Urology

## 2019-06-18 NOTE — Telephone Encounter (Signed)
Refill denied, pt last seen 2019, need follow-up appt

## 2019-07-16 ENCOUNTER — Other Ambulatory Visit: Payer: Self-pay | Admitting: Podiatry

## 2019-08-17 DIAGNOSIS — I2510 Atherosclerotic heart disease of native coronary artery without angina pectoris: Secondary | ICD-10-CM | POA: Diagnosis not present

## 2019-08-17 DIAGNOSIS — E785 Hyperlipidemia, unspecified: Secondary | ICD-10-CM | POA: Diagnosis not present

## 2019-08-17 DIAGNOSIS — F329 Major depressive disorder, single episode, unspecified: Secondary | ICD-10-CM | POA: Diagnosis not present

## 2019-08-21 DIAGNOSIS — Z933 Colostomy status: Secondary | ICD-10-CM | POA: Diagnosis not present

## 2019-08-22 DIAGNOSIS — F329 Major depressive disorder, single episode, unspecified: Secondary | ICD-10-CM | POA: Diagnosis not present

## 2019-08-22 DIAGNOSIS — E785 Hyperlipidemia, unspecified: Secondary | ICD-10-CM | POA: Diagnosis not present

## 2019-08-22 DIAGNOSIS — I2510 Atherosclerotic heart disease of native coronary artery without angina pectoris: Secondary | ICD-10-CM | POA: Diagnosis not present

## 2019-09-04 ENCOUNTER — Ambulatory Visit: Payer: PPO | Admitting: Podiatry

## 2019-09-04 ENCOUNTER — Other Ambulatory Visit: Payer: Self-pay

## 2019-09-04 DIAGNOSIS — M79676 Pain in unspecified toe(s): Secondary | ICD-10-CM | POA: Diagnosis not present

## 2019-09-04 DIAGNOSIS — B351 Tinea unguium: Secondary | ICD-10-CM | POA: Diagnosis not present

## 2019-09-04 DIAGNOSIS — L989 Disorder of the skin and subcutaneous tissue, unspecified: Secondary | ICD-10-CM

## 2019-09-04 NOTE — Progress Notes (Signed)
    Subjective: Patient is a 86 y.o. male presenting to the office today for follow up evaluation of painful callus lesion(s) noted to the right foot. Walking and bearing weight increases the pain. He has not done anything at home for treatment.   Patient also complains of elongated, thickened nails that cause pain while ambulating in shoes. He is unable to trim his own nails. Patient presents today for further treatment and evaluation.  Past Medical History:  Diagnosis Date  . Allergy   . Anxiety 06/17/2014  . Benign prostatic hyperplasia with urinary obstruction 06/17/2014  . Benign prostatic hypertrophy without urinary obstruction 06/20/2013  . Bladder calculi 04/26/2013  . Calculi, ureter 03/29/2013  . Calculus of kidney 02/25/2013  . Cataract   . Clinical depression 06/17/2014  . Degenerative disc disease, lumbar   . Frank hematuria 06/28/2013  . HLD (hyperlipidemia) 06/17/2014  . Hx of basal cell carcinoma    multiple sites  . Hx of squamous cell carcinoma    multiple sites  . Spondylolysis     Objective:  Physical Exam General: Alert and oriented x3 in no acute distress  Dermatology: Hyperkeratotic lesion(s) present on the right sub-first MPJ. Pain on palpation with a central nucleated core noted. Skin is warm, dry and supple bilateral lower extremities. Negative for open lesions or macerations. Nails are tender, long, thickened and dystrophic with subungual debris, consistent with onychomycosis, 1-5 bilateral. No signs of infection noted.  Vascular: Palpable pedal pulses bilaterally. No edema or erythema noted. Capillary refill within normal limits.  Neurological: Epicritic and protective threshold grossly intact bilaterally.   Musculoskeletal Exam: Pain on palpation at the keratotic lesion(s) noted. Range of motion within normal limits bilateral. Muscle strength 5/5 in all groups bilateral.  Assessment: 1. Onychodystrophic nails 1-5 bilateral with hyperkeratosis of nails.  2.  Onychomycosis of nail due to dermatophyte bilateral 3. Pre-ulcerative callus lesion noted to the sub-1st MPJ b/l    Plan of Care:  1. Patient evaluated. 2. Excisional debridement of keratoic lesion(s) using a chisel blade was performed without incident.  3. Dressed with light dressing. 4. Mechanical debridement of nails 1-5 bilaterally performed using a nail nipper. Filed with dremel without incident.  5. Patient is to return to the clinic in 3 months.   Edrick Kins, DPM [facility]  Dr. Edrick Kins, [address]                                        [place], [place] [address]                Office [phone]  Fax [phone]

## 2019-09-10 DIAGNOSIS — F419 Anxiety disorder, unspecified: Secondary | ICD-10-CM | POA: Diagnosis not present

## 2019-09-10 DIAGNOSIS — M545 Low back pain: Secondary | ICD-10-CM | POA: Diagnosis not present

## 2019-09-17 ENCOUNTER — Other Ambulatory Visit: Payer: Self-pay

## 2019-09-17 ENCOUNTER — Ambulatory Visit: Payer: PPO | Admitting: Dermatology

## 2019-09-17 DIAGNOSIS — L820 Inflamed seborrheic keratosis: Secondary | ICD-10-CM | POA: Diagnosis not present

## 2019-09-17 DIAGNOSIS — L821 Other seborrheic keratosis: Secondary | ICD-10-CM | POA: Diagnosis not present

## 2019-09-17 DIAGNOSIS — L578 Other skin changes due to chronic exposure to nonionizing radiation: Secondary | ICD-10-CM | POA: Diagnosis not present

## 2019-09-17 DIAGNOSIS — L570 Actinic keratosis: Secondary | ICD-10-CM | POA: Diagnosis not present

## 2019-09-17 NOTE — Progress Notes (Signed)
   Follow-Up Visit   Subjective  Todd Bennett is a 86 y.o. male who presents for the following: Actinic Keratosis (follow up - scalp - PDT 04/23/2019). Also other spots to be checked.  The following portions of the chart were reviewed this encounter and updated as appropriate:  Tobacco  Allergies  Meds  Problems  Med Hx  Surg Hx  Fam Hx     Review of Systems:  No other skin or systemic complaints except as noted in HPI or Assessment and Plan.  Objective  Well appearing patient in no apparent distress; mood and affect are within normal limits.  A focused examination was performed including scalp, face. Relevant physical exam findings are noted in the Assessment and Plan.  Objective  Scalp/face x 10, right wrist x 2, left 5th finger x 1, left hand x 2 (15): Erythematous thin papules/macules with gritty scale.   Objective  Face (3): Erythematous keratotic or waxy stuck-on papule or plaque.    Assessment & Plan    Actinic Damage - diffuse scaly erythematous macules with underlying dyspigmentation - Recommend daily broad spectrum sunscreen SPF 30+ to sun-exposed areas, reapply every 2 hours as needed.  - Call for new or changing lesions.  Seborrheic Keratoses - Stuck-on, waxy, tan-brown papules and plaques  - Discussed benign etiology and prognosis. - Observe - Call for any changes   AK (actinic keratosis) (15) Scalp/face x 10, right wrist x 2, left 5th finger x 1, left hand x 2  Destruction of lesion - Scalp/face x 10, right wrist x 2, left 5th finger x 1, left hand x 2 Complexity: simple   Destruction method: cryotherapy   Informed consent: discussed and consent obtained   Timeout:  patient name, date of birth, surgical site, and procedure verified Lesion destroyed using liquid nitrogen: Yes   Region frozen until ice ball extended beyond lesion: Yes   Outcome: patient tolerated procedure well with no complications   Post-procedure details: wound care instructions  given    Inflamed seborrheic keratosis (3) Face  Destruction of lesion - Face Complexity: simple   Destruction method: cryotherapy   Informed consent: discussed and consent obtained   Timeout:  patient name, date of birth, surgical site, and procedure verified Lesion destroyed using liquid nitrogen: Yes   Region frozen until ice ball extended beyond lesion: Yes   Outcome: patient tolerated procedure well with no complications   Post-procedure details: wound care instructions given    Return in about 4 weeks (around 10/15/2019) for PDT to face then 6 months with Dr. Nehemiah Massed for AK follow up.   I, Ashok Cordia, CMA, am acting as scribe for Sarina Ser, MD .  Documentation: I have reviewed the above documentation for accuracy and completeness, and I agree with the above.  Sarina Ser, MD

## 2019-09-17 NOTE — Patient Instructions (Signed)

## 2019-09-25 ENCOUNTER — Encounter: Payer: Self-pay | Admitting: Dermatology

## 2019-10-25 ENCOUNTER — Other Ambulatory Visit: Payer: Self-pay

## 2019-10-25 ENCOUNTER — Ambulatory Visit (INDEPENDENT_AMBULATORY_CARE_PROVIDER_SITE_OTHER): Payer: PPO

## 2019-10-25 DIAGNOSIS — L570 Actinic keratosis: Secondary | ICD-10-CM | POA: Diagnosis not present

## 2019-10-25 MED ORDER — AMINOLEVULINIC ACID HCL 20 % EX SOLR
1.0000 "application " | Freq: Once | CUTANEOUS | Status: AC
  Administered 2019-10-25: 354 mg via TOPICAL

## 2019-10-25 NOTE — Progress Notes (Signed)

## 2019-10-25 NOTE — Patient Instructions (Signed)

## 2019-10-29 DIAGNOSIS — H35372 Puckering of macula, left eye: Secondary | ICD-10-CM | POA: Diagnosis not present

## 2019-11-29 DIAGNOSIS — E785 Hyperlipidemia, unspecified: Secondary | ICD-10-CM | POA: Diagnosis not present

## 2019-11-29 DIAGNOSIS — I2510 Atherosclerotic heart disease of native coronary artery without angina pectoris: Secondary | ICD-10-CM | POA: Diagnosis not present

## 2019-11-29 DIAGNOSIS — F419 Anxiety disorder, unspecified: Secondary | ICD-10-CM | POA: Diagnosis not present
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
## 2019-12-05 ENCOUNTER — Other Ambulatory Visit: Payer: Self-pay | Admitting: Podiatry
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
## 2019-12-06 NOTE — Telephone Encounter (Signed)
Please advise 

## 2019-12-07 ENCOUNTER — Ambulatory Visit: Payer: PPO | Admitting: Podiatry

## 2019-12-07 ENCOUNTER — Other Ambulatory Visit: Payer: Self-pay

## 2019-12-07 DIAGNOSIS — M79676 Pain in unspecified toe(s): Secondary | ICD-10-CM | POA: Diagnosis not present

## 2019-12-07 DIAGNOSIS — L989 Disorder of the skin and subcutaneous tissue, unspecified: Secondary | ICD-10-CM | POA: Diagnosis not present

## 2019-12-07 DIAGNOSIS — B351 Tinea unguium: Secondary | ICD-10-CM

## 2019-12-07 NOTE — Progress Notes (Signed)
    Subjective: Patient is a 86 y.o. male presenting to the office today for follow up evaluation of painful callus lesion(s) noted to the right foot. Walking and bearing weight increases the pain. He has not done anything at home for treatment.   Patient also complains of elongated, thickened nails that cause pain while ambulating in shoes. He is unable to trim his own nails. Patient presents today for further treatment and evaluation.  Past Medical History:  Diagnosis Date  . Actinic keratosis   . Allergy   . Anxiety 06/17/2014  . Basal cell carcinoma 06/24/2009   Left nose sup. nasal alar crease.   . Basal cell carcinoma 09/11/2012   Left lateral bicep. Focal sclerosis. Excised: 11/01/2012, margins free.  . Benign prostatic hyperplasia with urinary obstruction 06/17/2014  . Benign prostatic hypertrophy without urinary obstruction 06/20/2013  . Bladder calculi 04/26/2013  . Calculi, ureter 03/29/2013  . Calculus of kidney 02/25/2013  . Cataract   . Clinical depression 06/17/2014  . Degenerative disc disease, lumbar   . Frank hematuria 06/28/2013  . HLD (hyperlipidemia) 06/17/2014  . Hx of squamous cell carcinoma    multiple sites  . Spondylolysis   . Squamous cell carcinoma of skin 09/11/2012   Right lateral antecubital area. CIS.  Marland Kitchen Squamous cell carcinoma of skin 03/10/2015   Right temple hair line. CIS.  Marland Kitchen Squamous cell carcinoma of skin 07/26/2016   Right temple hair line. CIS, hypertrophic.    Objective:  Physical Exam General: Alert and oriented x3 in no acute distress  Dermatology: Hyperkeratotic lesion(s) present on the right sub-first MPJ. Pain on palpation with a central nucleated core noted. Skin is warm, dry and supple bilateral lower extremities. Negative for open lesions or macerations. Nails are tender, long, thickened and dystrophic with subungual debris, consistent with onychomycosis, 1-5 bilateral. No signs of infection noted.  Vascular: Palpable pedal pulses  bilaterally. No edema or erythema noted. Capillary refill within normal limits.  Neurological: Epicritic and protective threshold grossly intact bilaterally.   Musculoskeletal Exam: Pain on palpation at the keratotic lesion(s) noted. Range of motion within normal limits bilateral. Muscle strength 5/5 in all groups bilateral.  Assessment: 1. Onychodystrophic nails 1-5 bilateral with hyperkeratosis of nails.  2. Onychomycosis of nail due to dermatophyte bilateral 3. Pre-ulcerative callus lesion noted to the sub-1st MPJ b/l    Plan of Care:  1. Patient evaluated. 2. Excisional debridement of keratoic lesion(s) using a chisel blade was performed without incident.  3. Dressed with light dressing. 4. Mechanical debridement of nails 1-5 bilaterally performed using a nail nipper. Filed with dremel without incident.  5. Patient is to return to the clinic in 3 months.   Edrick Kins, DPM [facility]  Dr. Edrick Kins, [address]                                        [place], [place] [address]                Office [phone]  Fax [phone]

## 2019-12-25 DIAGNOSIS — R2681 Unsteadiness on feet: Secondary | ICD-10-CM | POA: Diagnosis not present

## 2019-12-25 DIAGNOSIS — Z933 Colostomy status: Secondary | ICD-10-CM | POA: Diagnosis not present

## 2020-01-22 DIAGNOSIS — I2510 Atherosclerotic heart disease of native coronary artery without angina pectoris: Secondary | ICD-10-CM | POA: Diagnosis not present

## 2020-01-22 DIAGNOSIS — R2681 Unsteadiness on feet: Secondary | ICD-10-CM | POA: Diagnosis not present

## 2020-01-22 DIAGNOSIS — M5137 Other intervertebral disc degeneration, lumbosacral region: Secondary | ICD-10-CM | POA: Diagnosis not present

## 2020-01-22 DIAGNOSIS — M4316 Spondylolisthesis, lumbar region: Secondary | ICD-10-CM | POA: Diagnosis not present

## 2020-01-22 DIAGNOSIS — R262 Difficulty in walking, not elsewhere classified: Secondary | ICD-10-CM | POA: Diagnosis not present

## 2020-01-29 DIAGNOSIS — R2681 Unsteadiness on feet: Secondary | ICD-10-CM | POA: Diagnosis not present

## 2020-01-29 DIAGNOSIS — Z48817 Encounter for surgical aftercare following surgery on the skin and subcutaneous tissue: Secondary | ICD-10-CM | POA: Diagnosis not present

## 2020-01-29 DIAGNOSIS — R262 Difficulty in walking, not elsewhere classified: Secondary | ICD-10-CM | POA: Diagnosis not present

## 2020-01-29 DIAGNOSIS — M4316 Spondylolisthesis, lumbar region: Secondary | ICD-10-CM | POA: Diagnosis not present

## 2020-01-29 DIAGNOSIS — I2510 Atherosclerotic heart disease of native coronary artery without angina pectoris: Secondary | ICD-10-CM | POA: Diagnosis not present

## 2020-01-29 DIAGNOSIS — M5137 Other intervertebral disc degeneration, lumbosacral region: Secondary | ICD-10-CM | POA: Diagnosis not present

## 2020-01-30 DIAGNOSIS — F32A Depression, unspecified: Secondary | ICD-10-CM | POA: Diagnosis not present

## 2020-03-04 DIAGNOSIS — I2510 Atherosclerotic heart disease of native coronary artery without angina pectoris: Secondary | ICD-10-CM | POA: Diagnosis not present

## 2020-03-04 DIAGNOSIS — M5137 Other intervertebral disc degeneration, lumbosacral region: Secondary | ICD-10-CM | POA: Diagnosis not present

## 2020-03-04 DIAGNOSIS — M4316 Spondylolisthesis, lumbar region: Secondary | ICD-10-CM | POA: Diagnosis not present

## 2020-03-04 DIAGNOSIS — R262 Difficulty in walking, not elsewhere classified: Secondary | ICD-10-CM | POA: Diagnosis not present

## 2020-03-04 DIAGNOSIS — R2681 Unsteadiness on feet: Secondary | ICD-10-CM | POA: Diagnosis not present

## 2020-03-04 DIAGNOSIS — Z48817 Encounter for surgical aftercare following surgery on the skin and subcutaneous tissue: Secondary | ICD-10-CM | POA: Diagnosis not present

## 2020-03-06 DIAGNOSIS — R296 Repeated falls: Secondary | ICD-10-CM | POA: Diagnosis not present

## 2020-03-06 DIAGNOSIS — R2689 Other abnormalities of gait and mobility: Secondary | ICD-10-CM | POA: Diagnosis not present

## 2020-03-10 ENCOUNTER — Other Ambulatory Visit: Payer: Self-pay | Admitting: Neurology

## 2020-03-10 DIAGNOSIS — R2689 Other abnormalities of gait and mobility: Secondary | ICD-10-CM

## 2020-03-10 DIAGNOSIS — R296 Repeated falls: Secondary | ICD-10-CM

## 2020-03-14 ENCOUNTER — Ambulatory Visit: Payer: PPO | Admitting: Podiatry

## 2020-03-14 ENCOUNTER — Other Ambulatory Visit: Payer: Self-pay

## 2020-03-14 ENCOUNTER — Encounter: Payer: Self-pay | Admitting: Podiatry

## 2020-03-14 DIAGNOSIS — B351 Tinea unguium: Secondary | ICD-10-CM

## 2020-03-14 DIAGNOSIS — L989 Disorder of the skin and subcutaneous tissue, unspecified: Secondary | ICD-10-CM | POA: Diagnosis not present

## 2020-03-14 DIAGNOSIS — M79676 Pain in unspecified toe(s): Secondary | ICD-10-CM

## 2020-03-14 NOTE — Progress Notes (Signed)
    Subjective: Patient is a 87 y.o. male presenting to the office today for follow up evaluation of painful callus lesion(s) noted to the right foot. Walking and bearing weight increases the pain. He has not done anything at home for treatment.   Patient also complains of elongated, thickened nails that cause pain while ambulating in shoes. He is unable to trim his own nails. Patient presents today for further treatment and evaluation.  Past Medical History:  Diagnosis Date   Actinic keratosis    Allergy    Anxiety 06/17/2014   Basal cell carcinoma 06/24/2009   Left nose sup. nasal alar crease.    Basal cell carcinoma 09/11/2012   Left lateral bicep. Focal sclerosis. Excised: 11/01/2012, margins free.   Benign prostatic hyperplasia with urinary obstruction 06/17/2014   Benign prostatic hypertrophy without urinary obstruction 06/20/2013   Bladder calculi 04/26/2013   Calculi, ureter 03/29/2013   Calculus of kidney 02/25/2013   Cataract    Clinical depression 06/17/2014   Degenerative disc disease, lumbar    Frank hematuria 06/28/2013   HLD (hyperlipidemia) 06/17/2014   Hx of squamous cell carcinoma    multiple sites   Spondylolysis    Squamous cell carcinoma of skin 09/11/2012   Right lateral antecubital area. SCCis.    Squamous cell carcinoma of skin 03/10/2015   Right temple hair line. SCCis   Squamous cell carcinoma of skin 07/26/2016   Right temple hair line. SCCis, hypertrophic.    Objective:  Physical Exam General: Alert and oriented x3 in no acute distress  Dermatology: Hyperkeratotic lesion(s) present on the right sub-first MPJ. Pain on palpation with a central nucleated core noted. Skin is warm, dry and supple bilateral lower extremities. Negative for open lesions or macerations. Nails are tender, long, thickened and dystrophic with subungual debris, consistent with onychomycosis, 1-5 bilateral. No signs of infection noted.  Vascular: Palpable pedal pulses bilaterally. No edema  or erythema noted. Capillary refill within normal limits.  Neurological: Epicritic and protective threshold grossly intact bilaterally.   Musculoskeletal Exam: Pain on palpation at the keratotic lesion(s) noted. Range of motion within normal limits bilateral. Muscle strength 5/5 in all groups bilateral.  Assessment: 1. Onychodystrophic nails 1-5 bilateral with hyperkeratosis of nails.  2. Onychomycosis of nail due to dermatophyte bilateral 3. Pre-ulcerative callus lesion noted to the sub-1st MPJ b/l    Plan of Care:  1. Patient evaluated. 2. Excisional debridement of keratoic lesion(s) using a chisel blade was performed without incident.  3. Dressed with light dressing. 4. Mechanical debridement of nails 1-5 bilaterally performed using a nail nipper. Filed with dremel without incident.  5. Patient is to return to the clinic in 3 months.   Geordan Xu M. Chantil Bari, DPM [facility]  Dr. Makya Yurko M. Fawzi Melman, DPM    [address]                                      [place], [place] [address]                Office [phone]  Fax [phone]   

## 2020-03-20 ENCOUNTER — Other Ambulatory Visit: Payer: Self-pay

## 2020-03-20 ENCOUNTER — Ambulatory Visit: Payer: PPO | Admitting: Dermatology

## 2020-03-20 DIAGNOSIS — L570 Actinic keratosis: Secondary | ICD-10-CM

## 2020-03-20 DIAGNOSIS — L820 Inflamed seborrheic keratosis: Secondary | ICD-10-CM

## 2020-03-20 DIAGNOSIS — L578 Other skin changes due to chronic exposure to nonionizing radiation: Secondary | ICD-10-CM

## 2020-03-20 DIAGNOSIS — L821 Other seborrheic keratosis: Secondary | ICD-10-CM | POA: Diagnosis not present

## 2020-03-20 NOTE — Patient Instructions (Signed)
Cryotherapy Aftercare  . Wash gently with soap and water everyday.   . Apply Vaseline and Band-Aid daily until healed.  

## 2020-03-20 NOTE — Progress Notes (Signed)
   Follow-Up Visit   Subjective  Todd Bennett is a 87 y.o. male who presents for the following: Actinic Keratosis (6 months f/u Aks on the face,scalp treated with LN2 and PDT 6 months ago ). Pt c/o irritated spots behinds his ears he he would like removed.   The following portions of the chart were reviewed this encounter and updated as appropriate:   Tobacco  Allergies  Meds  Problems  Med Hx  Surg Hx  Fam Hx     Review of Systems:  No other skin or systemic complaints except as noted in HPI or Assessment and Plan.  Objective  Well appearing patient in no apparent distress; mood and affect are within normal limits.  A focused examination was performed including face,scalp . Relevant physical exam findings are noted in the Assessment and Plan.  Objective  face, scalp, ears, hands (18): Erythematous thin papules/macules with gritty scale.   Objective  Right post auricular, R preauricular (9): Erythematous keratotic or waxy stuck-on papule or plaque.    Assessment & Plan  AK (actinic keratosis) (18) face, scalp, ears, hands  Destruction of lesion - face, scalp, ears, hands Complexity: simple   Destruction method: cryotherapy   Informed consent: discussed and consent obtained   Timeout:  patient name, date of birth, surgical site, and procedure verified Lesion destroyed using liquid nitrogen: Yes   Region frozen until ice ball extended beyond lesion: Yes   Outcome: patient tolerated procedure well with no complications   Post-procedure details: wound care instructions given    Inflamed seborrheic keratosis (9) Right post auricular, R preauricular  Destruction of lesion - Right post auricular, R preauricular Complexity: simple   Destruction method: cryotherapy   Informed consent: discussed and consent obtained   Timeout:  patient name, date of birth, surgical site, and procedure verified Lesion destroyed using liquid nitrogen: Yes   Region frozen until ice ball  extended beyond lesion: Yes   Outcome: patient tolerated procedure well with no complications   Post-procedure details: wound care instructions given    Actinic Damage - chronic, secondary to cumulative UV radiation exposure/sun exposure over time - diffuse scaly erythematous macules with underlying dyspigmentation - Recommend daily broad spectrum sunscreen SPF 30+ to sun-exposed areas, reapply every 2 hours as needed.  - Call for new or changing lesions.  Seborrheic Keratoses - Stuck-on, waxy, tan-brown papules and plaques  - Discussed benign etiology and prognosis. - Observe - Call for any changes  Return in about 6 months (around 09/17/2020).  IMarye Round, CMA, am acting as scribe for Sarina Ser, MD .  Documentation: I have reviewed the above documentation for accuracy and completeness, and I agree with the above.  Sarina Ser, MD

## 2020-03-22 ENCOUNTER — Encounter: Payer: Self-pay | Admitting: Dermatology

## 2020-03-24 ENCOUNTER — Other Ambulatory Visit: Payer: Self-pay

## 2020-03-24 ENCOUNTER — Ambulatory Visit
Admission: RE | Admit: 2020-03-24 | Discharge: 2020-03-24 | Disposition: A | Discharge status: Home / Self Care | Payer: PPO | Source: Ambulatory Visit | Attending: Neurology | Admitting: Neurology

## 2020-03-24 DIAGNOSIS — G9389 Other specified disorders of brain: Secondary | ICD-10-CM | POA: Diagnosis not present

## 2020-03-24 DIAGNOSIS — R296 Repeated falls: Secondary | ICD-10-CM | POA: Insufficient documentation

## 2020-03-24 DIAGNOSIS — R2689 Other abnormalities of gait and mobility: Secondary | ICD-10-CM | POA: Diagnosis not present

## 2020-03-24 DIAGNOSIS — I6782 Cerebral ischemia: Secondary | ICD-10-CM | POA: Diagnosis not present

## 2020-03-25 ENCOUNTER — Ambulatory Visit: Admission: RE | Admit: 2020-03-25 | Payer: PPO | Source: Ambulatory Visit

## 2020-04-21 DIAGNOSIS — Z933 Colostomy status: Secondary | ICD-10-CM | POA: Diagnosis not present

## 2020-04-22 ENCOUNTER — Other Ambulatory Visit: Payer: Self-pay | Admitting: Dermatology

## 2020-04-28 DIAGNOSIS — Z961 Presence of intraocular lens: Secondary | ICD-10-CM | POA: Diagnosis not present

## 2020-04-29 DIAGNOSIS — F32A Depression, unspecified: Secondary | ICD-10-CM | POA: Diagnosis not present

## 2020-04-29 DIAGNOSIS — E785 Hyperlipidemia, unspecified: Secondary | ICD-10-CM | POA: Diagnosis not present

## 2020-04-29 DIAGNOSIS — I2510 Atherosclerotic heart disease of native coronary artery without angina pectoris: Secondary | ICD-10-CM | POA: Diagnosis not present

## 2020-04-29 DIAGNOSIS — N401 Enlarged prostate with lower urinary tract symptoms: Secondary | ICD-10-CM | POA: Diagnosis not present

## 2020-05-20 DIAGNOSIS — H18421 Band keratopathy, right eye: Secondary | ICD-10-CM | POA: Diagnosis not present

## 2020-06-13 ENCOUNTER — Other Ambulatory Visit: Payer: Self-pay

## 2020-06-13 ENCOUNTER — Ambulatory Visit: Payer: PPO | Admitting: Podiatry

## 2020-06-13 DIAGNOSIS — M79676 Pain in unspecified toe(s): Secondary | ICD-10-CM

## 2020-06-13 DIAGNOSIS — B351 Tinea unguium: Secondary | ICD-10-CM

## 2020-06-13 DIAGNOSIS — L989 Disorder of the skin and subcutaneous tissue, unspecified: Secondary | ICD-10-CM

## 2020-06-13 NOTE — Progress Notes (Signed)
    Subjective: Patient is a 87 y.o. male presenting to the office today for follow up evaluation of painful callus lesion(s) noted to the right foot. Walking and bearing weight increases the pain. He has not done anything at home for treatment.   Patient also complains of elongated, thickened nails that cause pain while ambulating in shoes. He is unable to trim his own nails. Patient presents today for further treatment and evaluation.  Past Medical History:  Diagnosis Date   Actinic keratosis    Allergy    Anxiety 06/17/2014   Basal cell carcinoma 06/24/2009   Left nose sup. nasal alar crease.    Basal cell carcinoma 09/11/2012   Left lateral bicep. Focal sclerosis. Excised: 11/01/2012, margins free.   Benign prostatic hyperplasia with urinary obstruction 06/17/2014   Benign prostatic hypertrophy without urinary obstruction 06/20/2013   Bladder calculi 04/26/2013   Calculi, ureter 03/29/2013   Calculus of kidney 02/25/2013   Cataract    Clinical depression 06/17/2014   Degenerative disc disease, lumbar    Frank hematuria 06/28/2013   HLD (hyperlipidemia) 06/17/2014   Hx of squamous cell carcinoma    multiple sites   Spondylolysis    Squamous cell carcinoma of skin 09/11/2012   Right lateral antecubital area. SCCis.    Squamous cell carcinoma of skin 03/10/2015   Right temple hair line. SCCis   Squamous cell carcinoma of skin 07/26/2016   Right temple hair line. SCCis, hypertrophic.    Objective:  Physical Exam General: Alert and oriented x3 in no acute distress  Dermatology: Hyperkeratotic lesion(s) present on the right sub-first MPJ. Pain on palpation with a central nucleated core noted. Skin is warm, dry and supple bilateral lower extremities. Negative for open lesions or macerations. Nails are tender, long, thickened and dystrophic with subungual debris, consistent with onychomycosis, 1-5 bilateral. No signs of infection noted.  Vascular: Palpable pedal pulses bilaterally. No edema  or erythema noted. Capillary refill within normal limits.  Neurological: Epicritic and protective threshold grossly intact bilaterally.   Musculoskeletal Exam: Pain on palpation at the keratotic lesion(s) noted. Range of motion within normal limits bilateral. Muscle strength 5/5 in all groups bilateral.  Assessment: 1. Onychodystrophic nails 1-5 bilateral with hyperkeratosis of nails.  2. Onychomycosis of nail due to dermatophyte bilateral 3. Pre-ulcerative callus lesion noted to the sub-1st MPJ b/l    Plan of Care:  1. Patient evaluated. 2. Excisional debridement of keratoic lesion(s) using a chisel blade was performed without incident.  3. Dressed with light dressing. 4. Mechanical debridement of nails 1-5 bilaterally performed using a nail nipper. Filed with dremel without incident.  5. Patient is to return to the clinic in 3 months.   Lindsey Demonte M. Lamisha Roussell, DPM [facility]  Dr. Farid Grigorian M. Keirsten Matuska, DPM    [address]                                      [place], [place] [address]                Office [phone]  Fax [phone]   

## 2020-06-24 DIAGNOSIS — E785 Hyperlipidemia, unspecified: Secondary | ICD-10-CM | POA: Diagnosis not present

## 2020-06-24 DIAGNOSIS — I2510 Atherosclerotic heart disease of native coronary artery without angina pectoris: Secondary | ICD-10-CM | POA: Diagnosis not present

## 2020-08-26 DIAGNOSIS — Z933 Colostomy status: Secondary | ICD-10-CM | POA: Diagnosis not present

## 2020-09-15 DIAGNOSIS — F32A Depression, unspecified: Secondary | ICD-10-CM | POA: Diagnosis not present

## 2020-09-15 DIAGNOSIS — M5442 Lumbago with sciatica, left side: Secondary | ICD-10-CM | POA: Diagnosis not present

## 2020-09-15 DIAGNOSIS — E785 Hyperlipidemia, unspecified: Secondary | ICD-10-CM | POA: Diagnosis not present

## 2020-09-15 DIAGNOSIS — M5441 Lumbago with sciatica, right side: Secondary | ICD-10-CM | POA: Diagnosis not present

## 2020-09-15 DIAGNOSIS — K219 Gastro-esophageal reflux disease without esophagitis: Secondary | ICD-10-CM | POA: Diagnosis not present

## 2020-09-15 DIAGNOSIS — Z0000 Encounter for general adult medical examination without abnormal findings: Secondary | ICD-10-CM | POA: Diagnosis not present

## 2020-09-15 DIAGNOSIS — I2510 Atherosclerotic heart disease of native coronary artery without angina pectoris: Secondary | ICD-10-CM | POA: Diagnosis not present

## 2020-09-15 DIAGNOSIS — R32 Unspecified urinary incontinence: Secondary | ICD-10-CM | POA: Diagnosis not present

## 2020-09-15 DIAGNOSIS — Z1331 Encounter for screening for depression: Secondary | ICD-10-CM | POA: Diagnosis not present

## 2020-09-15 DIAGNOSIS — N401 Enlarged prostate with lower urinary tract symptoms: Secondary | ICD-10-CM | POA: Diagnosis not present

## 2020-09-16 ENCOUNTER — Other Ambulatory Visit: Payer: Self-pay

## 2020-09-16 ENCOUNTER — Ambulatory Visit: Payer: PPO | Admitting: Podiatry

## 2020-09-16 DIAGNOSIS — M79676 Pain in unspecified toe(s): Secondary | ICD-10-CM | POA: Diagnosis not present

## 2020-09-16 DIAGNOSIS — B351 Tinea unguium: Secondary | ICD-10-CM

## 2020-09-16 DIAGNOSIS — L989 Disorder of the skin and subcutaneous tissue, unspecified: Secondary | ICD-10-CM | POA: Diagnosis not present

## 2020-09-16 NOTE — Progress Notes (Signed)
    Subjective: Patient is a 87 y.o. male presenting to the office today for follow up evaluation of painful callus lesion(s) noted to the right foot. Walking and bearing weight increases the pain. He has not done anything at home for treatment.   Patient also complains of elongated, thickened nails that cause pain while ambulating in shoes. He is unable to trim his own nails. Patient presents today for further treatment and evaluation.  Past Medical History:  Diagnosis Date   Actinic keratosis    Allergy    Anxiety 06/17/2014   Basal cell carcinoma 06/24/2009   Left nose sup. nasal alar crease.    Basal cell carcinoma 09/11/2012   Left lateral bicep. Focal sclerosis. Excised: 11/01/2012, margins free.   Benign prostatic hyperplasia with urinary obstruction 06/17/2014   Benign prostatic hypertrophy without urinary obstruction 06/20/2013   Bladder calculi 04/26/2013   Calculi, ureter 03/29/2013   Calculus of kidney 02/25/2013   Cataract    Clinical depression 06/17/2014   Degenerative disc disease, lumbar    Frank hematuria 06/28/2013   HLD (hyperlipidemia) 06/17/2014   Hx of squamous cell carcinoma    multiple sites   Spondylolysis    Squamous cell carcinoma of skin 09/11/2012   Right lateral antecubital area. SCCis.    Squamous cell carcinoma of skin 03/10/2015   Right temple hair line. SCCis   Squamous cell carcinoma of skin 07/26/2016   Right temple hair line. SCCis, hypertrophic.    Objective:  Physical Exam General: Alert and oriented x3 in no acute distress  Dermatology: Hyperkeratotic lesion(s) present on the right sub-first MPJ. Pain on palpation with a central nucleated core noted. Skin is warm, dry and supple bilateral lower extremities. Negative for open lesions or macerations. Nails are tender, long, thickened and dystrophic with subungual debris, consistent with onychomycosis, 1-5 bilateral. No signs of infection noted.  Vascular: Palpable pedal pulses bilaterally. No edema  or erythema noted. Capillary refill within normal limits.  Neurological: Epicritic and protective threshold grossly intact bilaterally.   Musculoskeletal Exam: Pain on palpation at the keratotic lesion(s) noted. Range of motion within normal limits bilateral. Muscle strength 5/5 in all groups bilateral.  Assessment: 1. Onychodystrophic nails 1-5 bilateral with hyperkeratosis of nails.  2. Onychomycosis of nail due to dermatophyte bilateral 3. Pre-ulcerative callus lesion noted to the sub-1st MPJ b/l    Plan of Care:  1. Patient evaluated. 2. Excisional debridement of keratoic lesion(s) using a chisel blade was performed without incident.  3. Dressed with light dressing. 4. Mechanical debridement of nails 1-5 bilaterally performed using a nail nipper. Filed with dremel without incident.  5. Patient is to return to the clinic in 3 months.   Edrick Kins, DPM [facility]  Dr. Edrick Kins, DPM    [address], [place] [address]                Office [phone]  Fax [phone]

## 2020-09-17 ENCOUNTER — Ambulatory Visit: Payer: PPO | Admitting: Dermatology

## 2020-11-05 DIAGNOSIS — H18421 Band keratopathy, right eye: Secondary | ICD-10-CM | POA: Diagnosis not present

## 2020-12-01 DIAGNOSIS — I2510 Atherosclerotic heart disease of native coronary artery without angina pectoris: Secondary | ICD-10-CM | POA: Diagnosis not present

## 2020-12-01 DIAGNOSIS — E785 Hyperlipidemia, unspecified: Secondary | ICD-10-CM | POA: Diagnosis not present

## 2020-12-23 ENCOUNTER — Other Ambulatory Visit: Payer: Self-pay

## 2020-12-23 ENCOUNTER — Ambulatory Visit: Payer: PPO | Admitting: Podiatry

## 2020-12-23 DIAGNOSIS — B351 Tinea unguium: Secondary | ICD-10-CM

## 2020-12-23 DIAGNOSIS — L989 Disorder of the skin and subcutaneous tissue, unspecified: Secondary | ICD-10-CM

## 2020-12-23 DIAGNOSIS — M79676 Pain in unspecified toe(s): Secondary | ICD-10-CM

## 2020-12-23 NOTE — Progress Notes (Signed)
    Subjective: Patient is a 87 y.o. male presenting to the office today for follow up evaluation of painful callus lesion(s) noted to the right foot. Walking and bearing weight increases the pain. He has not done anything at home for treatment.   Patient also complains of elongated, thickened nails that cause pain while ambulating in shoes. He is unable to trim his own nails. Patient presents today for further treatment and evaluation.  Past Medical History:  Diagnosis Date   Actinic keratosis    Allergy    Anxiety 06/17/2014   Basal cell carcinoma 06/24/2009   Left nose sup. nasal alar crease.    Basal cell carcinoma 09/11/2012   Left lateral bicep. Focal sclerosis. Excised: 11/01/2012, margins free.   Benign prostatic hyperplasia with urinary obstruction 06/17/2014   Benign prostatic hypertrophy without urinary obstruction 06/20/2013   Bladder calculi 04/26/2013   Calculi, ureter 03/29/2013   Calculus of kidney 02/25/2013   Cataract    Clinical depression 06/17/2014   Degenerative disc disease, lumbar    Frank hematuria 06/28/2013   HLD (hyperlipidemia) 06/17/2014   Hx of squamous cell carcinoma    multiple sites   Spondylolysis    Squamous cell carcinoma of skin 09/11/2012   Right lateral antecubital area. SCCis.    Squamous cell carcinoma of skin 03/10/2015   Right temple hair line. SCCis   Squamous cell carcinoma of skin 07/26/2016   Right temple hair line. SCCis, hypertrophic.    Objective:  Physical Exam General: Alert and oriented x3 in no acute distress  Dermatology: Hyperkeratotic lesion(s) present on the right sub-first MPJ. Pain on palpation with a central nucleated core noted. Skin is warm, dry and supple bilateral lower extremities. Negative for open lesions or macerations. Nails are tender, long, thickened and dystrophic with subungual debris, consistent with onychomycosis, 1-5 bilateral. No signs of infection noted.  Vascular: Palpable pedal pulses bilaterally. No edema  or erythema noted. Capillary refill within normal limits.  Neurological: Epicritic and protective threshold grossly intact bilaterally.   Musculoskeletal Exam: Pain on palpation at the keratotic lesion(s) noted. Range of motion within normal limits bilateral. Muscle strength 5/5 in all groups bilateral.  Assessment: 1. Onychodystrophic nails 1-5 bilateral with hyperkeratosis of nails.  2. Onychomycosis of nail due to dermatophyte bilateral 3. Pre-ulcerative callus lesion noted to the sub-1st MPJ b/l    Plan of Care:  1. Patient evaluated. 2. Excisional debridement of keratoic lesion(s) using a chisel blade was performed without incident.  3. Dressed with light dressing. 4. Mechanical debridement of nails 1-5 bilaterally performed using a nail nipper. Filed with dremel without incident.  5. Patient is to return to the clinic in 3 months.   Edrick Kins, DPM [facility]  Dr. Edrick Kins, DPM    [address], [place] [address]                Office [phone]  Fax [phone]

## 2021-01-13 DIAGNOSIS — Z933 Colostomy status: Secondary | ICD-10-CM | POA: Diagnosis not present

## 2021-01-16 DIAGNOSIS — N401 Enlarged prostate with lower urinary tract symptoms: Secondary | ICD-10-CM | POA: Diagnosis not present

## 2021-01-16 DIAGNOSIS — E785 Hyperlipidemia, unspecified: Secondary | ICD-10-CM | POA: Diagnosis not present

## 2021-01-16 DIAGNOSIS — F32A Depression, unspecified: Secondary | ICD-10-CM | POA: Diagnosis not present

## 2021-01-16 DIAGNOSIS — I2510 Atherosclerotic heart disease of native coronary artery without angina pectoris: Secondary | ICD-10-CM | POA: Diagnosis not present

## 2021-01-22 DIAGNOSIS — E785 Hyperlipidemia, unspecified: Secondary | ICD-10-CM | POA: Diagnosis not present

## 2021-01-22 DIAGNOSIS — I2510 Atherosclerotic heart disease of native coronary artery without angina pectoris: Secondary | ICD-10-CM | POA: Diagnosis not present

## 2021-02-19 ENCOUNTER — Ambulatory Visit (INDEPENDENT_AMBULATORY_CARE_PROVIDER_SITE_OTHER): Payer: PPO | Admitting: Dermatology

## 2021-02-19 ENCOUNTER — Other Ambulatory Visit: Payer: Self-pay

## 2021-02-19 DIAGNOSIS — Z86007 Personal history of in-situ neoplasm of skin: Secondary | ICD-10-CM | POA: Diagnosis not present

## 2021-02-19 DIAGNOSIS — L821 Other seborrheic keratosis: Secondary | ICD-10-CM | POA: Diagnosis not present

## 2021-02-19 DIAGNOSIS — L570 Actinic keratosis: Secondary | ICD-10-CM | POA: Diagnosis not present

## 2021-02-19 DIAGNOSIS — D229 Melanocytic nevi, unspecified: Secondary | ICD-10-CM | POA: Diagnosis not present

## 2021-02-19 DIAGNOSIS — D1800 Hemangioma unspecified site: Secondary | ICD-10-CM | POA: Diagnosis not present

## 2021-02-19 DIAGNOSIS — Z1283 Encounter for screening for malignant neoplasm of skin: Secondary | ICD-10-CM | POA: Diagnosis not present

## 2021-02-19 DIAGNOSIS — L820 Inflamed seborrheic keratosis: Secondary | ICD-10-CM | POA: Diagnosis not present

## 2021-02-19 DIAGNOSIS — C44310 Basal cell carcinoma of skin of unspecified parts of face: Secondary | ICD-10-CM

## 2021-02-19 DIAGNOSIS — L578 Other skin changes due to chronic exposure to nonionizing radiation: Secondary | ICD-10-CM

## 2021-02-19 DIAGNOSIS — L814 Other melanin hyperpigmentation: Secondary | ICD-10-CM | POA: Diagnosis not present

## 2021-02-19 DIAGNOSIS — D489 Neoplasm of uncertain behavior, unspecified: Secondary | ICD-10-CM

## 2021-02-19 DIAGNOSIS — C44319 Basal cell carcinoma of skin of other parts of face: Secondary | ICD-10-CM

## 2021-02-19 NOTE — Progress Notes (Signed)
Follow-Up Visit   Subjective  Todd Bennett is a 88 y.o. male who presents for the following: Follow-up (Patient here today for tbse. Patient reports several areas on legs, scalp, face, under left eye. ). The patient presents for Total-Body Skin Exam (TBSE) for skin cancer screening and mole check.  The patient has spots, moles and lesions to be evaluated, some may be new or changing and the patient has concerns that these could be cancer.  The following portions of the chart were reviewed this encounter and updated as appropriate:  Tobacco   Allergies   Meds   Problems   Med Hx   Surg Hx   Fam Hx      Review of Systems: No other skin or systemic complaints except as noted in HPI or Assessment and Plan.  Objective  Well appearing patient in no apparent distress; mood and affect are within normal limits.  A full examination was performed including scalp, head, eyes, ears, nose, lips, neck, chest, axillae, abdomen, back, buttocks, bilateral upper extremities, bilateral lower extremities, hands, feet, fingers, toes, fingernails, and toenails. All findings within normal limits unless otherwise noted below.  scalp / face x 16 (16) Erythematous thin papules/macules with gritty scale.   left lower eyelid margin x 1, bilateral side burns x 5, lower legs x 2 (8) Erythematous stuck-on, waxy papule or plaque  Right Preauricular 1 x 0.6 cm pink papule         Assessment & Plan  Actinic keratosis (16) scalp / face x 16  Will plan to prescribe  5-fluorouracil/calcipotriene cream  to affected areas including scalp and face at patient's next follow up.   Actinic keratoses are precancerous spots that appear secondary to cumulative UV radiation exposure/sun exposure over time. They are chronic with expected duration over 1 year. A portion of actinic keratoses will progress to squamous cell carcinoma of the skin. It is not possible to reliably predict which spots will progress to skin cancer and  so treatment is recommended to prevent development of skin cancer.  Recommend daily broad spectrum sunscreen SPF 30+ to sun-exposed areas, reapply every 2 hours as needed.  Recommend staying in the shade or wearing long sleeves, sun glasses (UVA+UVB protection) and wide brim hats (4-inch brim around the entire circumference of the hat). Call for new or changing lesions.  Destruction of lesion - scalp / face x 16 Complexity: simple   Destruction method: cryotherapy   Informed consent: discussed and consent obtained   Timeout:  patient name, date of birth, surgical site, and procedure verified Lesion destroyed using liquid nitrogen: Yes   Region frozen until ice ball extended beyond lesion: Yes   Outcome: patient tolerated procedure well with no complications   Post-procedure details: wound care instructions given   Additional details:  Prior to procedure, discussed risks of blister formation, small wound, skin dyspigmentation, or rare scar following cryotherapy. Recommend Vaseline ointment to treated areas while healing.  Inflamed seborrheic keratosis (8) left lower eyelid margin x 1, bilateral side burns x 5, lower legs x 2  Recheck at next follow up left lower eyelid margin   Destruction of lesion - left lower eyelid margin x 1, bilateral side burns x 5, lower legs x 2 Complexity: simple   Destruction method: cryotherapy   Informed consent: discussed and consent obtained   Timeout:  patient name, date of birth, surgical site, and procedure verified Lesion destroyed using liquid nitrogen: Yes   Region frozen until ice ball  extended beyond lesion: Yes   Outcome: patient tolerated procedure well with no complications   Post-procedure details: wound care instructions given   Additional details:  Prior to procedure, discussed risks of blister formation, small wound, skin dyspigmentation, or rare scar following cryotherapy. Recommend Vaseline ointment to treated areas while  healing.  Neoplasm of uncertain behavior Right Preauricular Epidermal / dermal shaving  Lesion diameter (cm):  1 Informed consent: discussed and consent obtained   Timeout: patient name, date of birth, surgical site, and procedure verified   Procedure prep:  Patient was prepped and draped in usual sterile fashion Prep type:  Isopropyl alcohol Anesthesia: the lesion was anesthetized in a standard fashion   Anesthetic:  1% lidocaine w/ epinephrine 1-100,000 buffered w/ 8.4% NaHCO3 Instrument used: flexible razor blade   Hemostasis achieved with: pressure, aluminum chloride and electrodesiccation   Outcome: patient tolerated procedure well   Post-procedure details: sterile dressing applied and wound care instructions given   Dressing type: bandage and petrolatum    Destruction of lesion Complexity: extensive   Destruction method: electrodesiccation and curettage   Informed consent: discussed and consent obtained   Timeout:  patient name, date of birth, surgical site, and procedure verified Procedure prep:  Patient was prepped and draped in usual sterile fashion Prep type:  Isopropyl alcohol Anesthesia: the lesion was anesthetized in a standard fashion   Anesthetic:  1% lidocaine w/ epinephrine 1-100,000 buffered w/ 8.4% NaHCO3 Curettage performed in three different directions: Yes   Electrodesiccation performed over the curetted area: Yes   Lesion length (cm):  1 Lesion width (cm):  1 Margin per side (cm):  0.2 Final wound size (cm):  1.4 Hemostasis achieved with:  pressure, aluminum chloride and electrodesiccation Outcome: patient tolerated procedure well with no complications   Post-procedure details: sterile dressing applied and wound care instructions given   Dressing type: bandage and petrolatum    Specimen 1 - Surgical pathology Differential Diagnosis: r/o bcc Check Margins: No  Skin cancer screening  Lentigines - Scattered tan macules - Due to sun exposure -  Benign-appearing, observe - Recommend daily broad spectrum sunscreen SPF 30+ to sun-exposed areas, reapply every 2 hours as needed. - Call for any changes  Seborrheic Keratoses - Stuck-on, waxy, tan-brown papules and/or plaques  - Benign-appearing - Discussed benign etiology and prognosis. - Observe - Call for any changes  Melanocytic Nevi - Tan-brown and/or pink-flesh-colored symmetric macules and papules - Benign appearing on exam today - Observation - Call clinic for new or changing moles - Recommend daily use of broad spectrum spf 30+ sunscreen to sun-exposed areas.   Hemangiomas - Red papules - Discussed benign nature - Observe - Call for any changes  Actinic Damage - Chronic condition, secondary to cumulative UV/sun exposure - diffuse scaly erythematous macules with underlying dyspigmentation - Recommend daily broad spectrum sunscreen SPF 30+ to sun-exposed areas, reapply every 2 hours as needed.  - Staying in the shade or wearing long sleeves, sun glasses (UVA+UVB protection) and wide brim hats (4-inch brim around the entire circumference of the hat) are also recommended for sun protection.  - Call for new or changing lesions.  History of Squamous Cell Carcinoma in Situ of the Skin - No evidence of recurrence today multiple locations see patient history  - Recommend regular full body skin exams - Recommend daily broad spectrum sunscreen SPF 30+ to sun-exposed areas, reapply every 2 hours as needed.  - Call if any new or changing lesions are noted between office visits  Skin cancer screening performed today. Return for 3 month ak follow up recheck lt lower eyelid, 1 year tbse. IRuthell Rummage, CMA, am acting as scribe for Sarina Ser, MD. Documentation: I have reviewed the above documentation for accuracy and completeness, and I agree with the above.  Sarina Ser, MD

## 2021-02-19 NOTE — Patient Instructions (Addendum)
Electrodesiccation and Curettage (Scrape and Burn) Wound Care Instructions  Leave the original bandage on for 24 hours if possible.  If the bandage becomes soaked or soiled before that time, it is OK to remove it and examine the wound.  A small amount of post-operative bleeding is normal.  If excessive bleeding occurs, remove the bandage, place gauze over the site and apply continuous pressure (no peeking) over the area for 30 minutes. If this does not work, please call our clinic as soon as possible or page your doctor if it is after hours.   Once a day, cleanse the wound with soap and water. It is fine to shower. If a thick crust develops you may use a Q-tip dipped into dilute hydrogen peroxide (mix 1:1 with water) to dissolve it.  Hydrogen peroxide can slow the healing process, so use it only as needed.    After washing, apply petroleum jelly (Vaseline) or an antibiotic ointment if your doctor prescribed one for you, followed by a bandage.    For best healing, the wound should be covered with a layer of ointment at all times. If you are not able to keep the area covered with a bandage to hold the ointment in place, this may mean re-applying the ointment several times a day.  Continue this wound care until the wound has healed and is no longer open. It may take several weeks for the wound to heal and close.  Itching and mild discomfort is normal during the healing process.  If you have any discomfort, you can take Tylenol (acetaminophen) or ibuprofen as directed on the bottle. (Please do not take these if you have an allergy to them or cannot take them for another reason).  Some redness, tenderness and white or yellow material in the wound is normal healing.  If the area becomes very sore and red, or develops a thick yellow-green material (pus), it may be infected; please notify us.    Wound healing continues for up to one year following surgery. It is not unusual to experience pain in the  scar from time to time during the interval.  If the pain becomes severe or the scar thickens, you should notify the office.    A slight amount of redness in a scar is expected for the first six months.  After six months, the redness will fade and the scar will soften and fade.  The color difference becomes less noticeable with time.  If there are any problems, return for a post-op surgery check at your earliest convenience.  To improve the appearance of the scar, you can use silicone scar gel, cream, or sheets (such as Mederma or Serica) every night for up to one year. These are available over the counter (without a prescription).  Please call our office at [phone] for any questions or concerns.  Biopsy Wound Care Instructions  Leave the original bandage on for 24 hours if possible.  If the bandage becomes soaked or soiled before that time, it is OK to remove it and examine the wound.  A small amount of post-operative bleeding is normal.  If excessive bleeding occurs, remove the bandage, place gauze over the site and apply continuous pressure (no peeking) over the area for 30 minutes. If this does not work, please call our clinic as soon as possible or page your doctor if it is after hours.   Once a day, cleanse the wound with soap and water. It is fine to shower.  If a thick crust develops you may use a Q-tip dipped into dilute hydrogen peroxide (mix 1:1 with water) to dissolve it.  Hydrogen peroxide can slow the healing process, so use it only as needed.    After washing, apply petroleum jelly (Vaseline) or an antibiotic ointment if your doctor prescribed one for you, followed by a bandage.    For best healing, the wound should be covered with a layer of ointment at all times. If you are not able to keep the area covered with a bandage to hold the ointment in place, this may mean re-applying the ointment several times a day.  Continue this wound care until the wound has healed and is no longer  open.   Itching and mild discomfort is normal during the healing process. However, if you develop pain or severe itching, please call our office.   If you have any discomfort, you can take Tylenol (acetaminophen) or ibuprofen as directed on the bottle. (Please do not take these if you have an allergy to them or cannot take them for another reason).  Some redness, tenderness and white or yellow material in the wound is normal healing.  If the area becomes very sore and red, or develops a thick yellow-green material (pus), it may be infected; please notify us.    If you have stitches, return to clinic as directed to have the stitches removed. You will continue wound care for 2-3 days after the stitches are removed.   Wound healing continues for up to one year following surgery. It is not unusual to experience pain in the scar from time to time during the interval.  If the pain becomes severe or the scar thickens, you should notify the office.    A slight amount of redness in a scar is expected for the first six months.  After six months, the redness will fade and the scar will soften and fade.  The color difference becomes less noticeable with time.  If there are any problems, return for a post-op surgery check at your earliest convenience.  To improve the appearance of the scar, you can use silicone scar gel, cream, or sheets (such as Mederma or Serica) every night for up to one year. These are available over the counter (without a prescription).  Please call our office at [phone] for any questions or concerns.  Actinic keratoses are precancerous spots that appear secondary to cumulative UV radiation exposure/sun exposure over time. They are chronic with expected duration over 1 year. A portion of actinic keratoses will progress to squamous cell carcinoma of the skin. It is not possible to reliably predict which spots will progress to skin cancer and so treatment is recommended to prevent  development of skin cancer.  Recommend daily broad spectrum sunscreen SPF 30+ to sun-exposed areas, reapply every 2 hours as needed.  Recommend staying in the shade or wearing long sleeves, sun glasses (UVA+UVB protection) and wide brim hats (4-inch brim around the entire circumference of the hat). Call for new or changing lesions.   Cryotherapy Aftercare  Wash gently with soap and water everyday.   Apply Vaseline and Band-Aid daily until healed.   Seborrheic Keratosis  What causes seborrheic keratoses? Seborrheic keratoses are harmless, common skin growths that first appear during adult life.  As time goes by, more growths appear.  Some people may develop a large number of them.  Seborrheic keratoses appear on both covered and uncovered body parts.  They are not caused by  sunlight.  The tendency to develop seborrheic keratoses can be inherited.  They vary in color from skin-colored to gray, brown, or even black.  They can be either smooth or have a rough, warty surface.   Seborrheic keratoses are superficial and look as if they were stuck on the skin.  Under the microscope this type of keratosis looks like layers upon layers of skin.  That is why at times the top layer may seem to fall off, but the rest of the growth remains and re-grows.    Treatment Seborrheic keratoses do not need to be treated, but can easily be removed in the office.  Seborrheic keratoses often cause symptoms when they rub on clothing or jewelry.  Lesions can be in the way of shaving.  If they become inflamed, they can cause itching, soreness, or burning.  Removal of a seborrheic keratosis can be accomplished by freezing, burning, or surgery. If any spot bleeds, scabs, or grows rapidly, please return to have it checked, as these can be an indication of a skin cancer.   Melanoma ABCDEs  Melanoma is the most dangerous type of skin cancer, and is the leading cause of death from skin disease.  You are more likely to develop  melanoma if you: Have light-colored skin, light-colored eyes, or red or blond hair Spend a lot of time in the sun Tan regularly, either outdoors or in a tanning bed Have had blistering sunburns, especially during childhood Have a close family member who has had a melanoma Have atypical moles or large birthmarks  Early detection of melanoma is key since treatment is typically straightforward and cure rates are extremely high if we catch it early.   The first sign of melanoma is often a change in a mole or a new dark spot.  The ABCDE system is a way of remembering the signs of melanoma.  A for asymmetry:  The two halves do not match. B for border:  The edges of the growth are irregular. C for color:  A mixture of colors are present instead of an even brown color. D for diameter:  Melanomas are usually (but not always) greater than 31mm - the size of a pencil eraser. E for evolution:  The spot keeps changing in size, shape, and color.  Please check your skin once per month between visits. You can use a small mirror in front and a large mirror behind you to keep an eye on the back side or your body.   If you see any new or changing lesions before your next follow-up, please call to schedule a visit.  Please continue daily skin protection including broad spectrum sunscreen SPF 30+ to sun-exposed areas, reapplying every 2 hours as needed when you're outdoors.   Staying in the shade or wearing long sleeves, sun glasses (UVA+UVB protection) and wide brim hats (4-inch brim around the entire circumference of the hat) are also recommended for sun protection.    If You Need Anything After Your Visit  If you have any questions or concerns for your doctor, please call our main line at [phone] and press option 4 to reach your doctor's medical assistant. If no one answers, please leave a voicemail as directed and we will return your call as soon as possible. Messages left after 4 pm will be answered  the following business day.   You may also send [place] a message via [place]. We typically respond to MyChart messages within 1-2 business days.  For prescription  refills, please ask your pharmacy to contact our office. Our fax number is [phone].  If you have an urgent issue when the clinic is closed that cannot wait until the next business day, you can page your doctor at the number below.    Please note that while we do our best to be available for urgent issues outside of office hours, we are not available 24/7.   If you have an urgent issue and are unable to reach [place], you may choose to seek medical care at your doctor's office, retail clinic, urgent care center, or emergency room.  If you have a medical emergency, please immediately call 911 or go to the emergency department.  Pager Numbers  - Dr. Nehemiah Massed: [phone]  - Dr. Laurence Ferrari: [phone]  - Dr. Nicole Kindred: [phone]  In the event of inclement weather, please call our main line at [phone] for an update on the status of any delays or closures.  Dermatology Medication Tips: Please keep the boxes that topical medications come in in order to help keep track of the instructions about where and how to use these. Pharmacies typically print the medication instructions only on the boxes and not directly on the medication tubes.   If your medication is too expensive, please contact our office at [phone] option 4 or send [place] a message through [place].   We are unable to tell what your co-pay for medications will be in advance as this is different depending on your insurance coverage. However, we may be able to find a substitute medication at lower cost or fill out paperwork to get insurance to cover a needed medication.   If a prior authorization is required to get your medication covered by your insurance company, please allow [place] 1-2 business days to complete this process.  Drug prices often vary depending on where the  prescription is filled and some pharmacies may offer cheaper prices.  The website www.goodrx.com contains coupons for medications through different pharmacies. The prices here do not account for what the cost may be with help from insurance (it may be cheaper with your insurance), but the website can give you the price if you did not use any insurance.  - You can print the associated coupon and take it with your prescription to the pharmacy.  - You may also stop by our office during regular business hours and pick up a GoodRx coupon card.  - If you need your prescription sent electronically to a different pharmacy, notify our office through [facility] or by phone at [phone] option 4.     Si Usted Necesita Algo Despus de Su Visita  Tambin puede enviarnos un mensaje a travs de Pharmacist, community. Por lo general respondemos a los mensajes de MyChart en el transcurso de 1 a 2 das hbiles.  Para renovar recetas, por favor pida a su farmacia que se ponga en contacto con nuestra oficina. Harland Dingwall de fax es Industry [phone].  Si tiene un asunto urgente cuando la clnica est cerrada y que no puede esperar hasta el siguiente da hbil, puede llamar/localizar a su doctor(a) al nmero que aparece a continuacin.   Por favor, tenga en cuenta que aunque hacemos todo lo posible para estar disponibles para asuntos urgentes fuera del horario de [place], no estamos disponibles las 24 horas del da, los 7 das de la [place].   Si tiene un problema urgente y no puede comunicarse con nosotros, puede optar por buscar atencin Warden/ranger de  su doctor(a), en una clnica privada, en un centro de atencin urgente o en una sala de emergencias.  Si tiene Engineering geologist, por favor llame inmediatamente al 911 o vaya a la sala de emergencias.  Nmeros de bper  - Dr. Nehemiah Massed: [phone]  - Dra. Moye: [phone]  - Dra. Nicole Kindred: [phone]  En caso de inclemencias del [place],  por favor llame a Johnsie Kindred principal al [phone] para una actualizacin sobre el [place] de cualquier retraso o cierre.  Consejos para la medicacin en dermatologa: Por favor, guarde las cajas en las que vienen los medicamentos de uso tpico para ayudarle a seguir las instrucciones sobre dnde y cmo usarlos. Las farmacias generalmente imprimen las instrucciones del medicamento slo en las cajas y no directamente en los tubos del [place].   Si su medicamento es muy caro, por favor, pngase en contacto con Zigmund Daniel llamando al [phone] y presione la opcin 4 o envenos un mensaje a travs de Pharmacist, community.   No podemos decirle cul ser su copago por los medicamentos por adelantado ya que esto es diferente dependiendo de la cobertura de su seguro. Sin embargo, es posible que podamos encontrar un medicamento sustituto a Electrical engineer un formulario para que el seguro cubra el medicamento que se considera necesario.   Si se requiere una autorizacin previa para que su compaa de seguros [place] su medicamento, por favor permtanos de 1 a 2 das hbiles para completar este proceso.  Los precios de los medicamentos varan con frecuencia dependiendo del Environmental consultant de dnde se surte la receta y alguna farmacias pueden ofrecer precios ms baratos.  El sitio web www.goodrx.com tiene cupones para medicamentos de Airline pilot. Los precios aqu no tienen en cuenta lo que podra costar con la ayuda del seguro (puede ser ms barato con su seguro), pero el sitio web puede darle el precio si no utiliz Research scientist (physical sciences).  - Puede imprimir el cupn correspondiente y llevarlo con su receta a la farmacia.  - Tambin puede pasar por nuestra oficina durante el horario de atencin regular y Charity fundraiser una tarjeta de cupones de GoodRx.  - Si necesita que su receta se enve electrnicamente a una farmacia diferente, informe a nuestra oficina a travs de MyChart de [place] o por telfono llamando al  [phone] y presione la opcin 4.

## 2021-02-23 ENCOUNTER — Telehealth: Payer: Self-pay

## 2021-02-23 NOTE — Telephone Encounter (Signed)
Patient informed of pathology results 

## 2021-02-23 NOTE — Telephone Encounter (Signed)
-----   Message from Ralene Bathe, MD sent at 02/20/2021  3:28 PM EST ----- Diagnosis Skin , right preauricular BASAL CELL CARCINOMA WITH FOCAL SCLEROSIS, SEE DESCRIPTION  Cancer - BCC Already treated Recheck next visit

## 2021-02-24 ENCOUNTER — Encounter: Payer: Self-pay | Admitting: Dermatology

## 2021-03-23 DIAGNOSIS — Z933 Colostomy status: Secondary | ICD-10-CM | POA: Diagnosis not present

## 2021-03-24 ENCOUNTER — Other Ambulatory Visit: Payer: Self-pay

## 2021-03-24 ENCOUNTER — Encounter: Payer: Self-pay | Admitting: Podiatry

## 2021-03-24 ENCOUNTER — Ambulatory Visit (INDEPENDENT_AMBULATORY_CARE_PROVIDER_SITE_OTHER): Payer: PPO | Admitting: Podiatry

## 2021-03-24 DIAGNOSIS — M79676 Pain in unspecified toe(s): Secondary | ICD-10-CM

## 2021-03-24 DIAGNOSIS — R7302 Impaired glucose tolerance (oral): Secondary | ICD-10-CM | POA: Diagnosis not present

## 2021-03-24 DIAGNOSIS — B351 Tinea unguium: Secondary | ICD-10-CM

## 2021-03-24 DIAGNOSIS — L989 Disorder of the skin and subcutaneous tissue, unspecified: Secondary | ICD-10-CM

## 2021-03-24 NOTE — Progress Notes (Signed)
° ° °  Subjective: Patient is a 88 y.o. male presenting to the office today for follow up evaluation of painful callus lesion(s) noted to the right foot. Walking and bearing weight increases the pain. He has not done anything at home for treatment.   Patient also complains of elongated, thickened nails that cause pain while ambulating in shoes. He is unable to trim his own nails. Patient presents today for further treatment and evaluation.  Past Medical History:  Diagnosis Date   Actinic keratosis    Allergy    Anxiety 06/17/2014   Basal cell carcinoma 06/24/2009   Left nose sup. nasal alar crease.    Basal cell carcinoma 09/11/2012   Left lateral bicep. Focal sclerosis. Excised: 11/01/2012, margins free.   Basal cell carcinoma 02/19/2021   R preauricular - ED&C   Benign prostatic hyperplasia with urinary obstruction 06/17/2014   Benign prostatic hypertrophy without urinary obstruction 06/20/2013   Bladder calculi 04/26/2013   Calculi, ureter 03/29/2013   Calculus of kidney 02/25/2013   Cataract    Clinical depression 06/17/2014   Degenerative disc disease, lumbar    Frank hematuria 06/28/2013   HLD (hyperlipidemia) 06/17/2014   Hx of squamous cell carcinoma    multiple sites   Spondylolysis    Squamous cell carcinoma of skin 09/11/2012   Right lateral antecubital area. SCCis.    Squamous cell carcinoma of skin 03/10/2015   Right temple hair line. SCCis   Squamous cell carcinoma of skin 07/26/2016   Right temple hair line. SCCis, hypertrophic.    Objective:  Physical Exam General: Alert and oriented x3 in no acute distress  Dermatology: Hyperkeratotic lesion(s) present on the right sub-first MPJ. Pain on palpation with a central nucleated core noted. Skin is warm, dry and supple bilateral lower extremities. Negative for open lesions or macerations. Nails are tender, long, thickened and dystrophic with subungual debris, consistent with onychomycosis, 1-5 bilateral. No signs of  infection noted.  Vascular: Palpable pedal pulses bilaterally. No edema or erythema noted. Capillary refill within normal limits.  Neurological: Epicritic and protective threshold grossly intact bilaterally.   Musculoskeletal Exam: Pain on palpation at the keratotic lesion(s) noted. Range of motion within normal limits bilateral. Muscle strength 5/5 in all groups bilateral.  Assessment: 1. Onychodystrophic nails 1-5 bilateral with hyperkeratosis of nails.  2. Onychomycosis of nail due to dermatophyte bilateral 3. Pre-ulcerative callus lesion noted to the sub-1st MPJ b/l    Plan of Care:  1. Patient evaluated. 2. Excisional debridement of keratoic lesion(s) using a chisel blade was performed without incident.  3. Dressed with light dressing. 4. Mechanical debridement of nails 1-5 bilaterally performed using a nail nipper. Filed with dremel without incident.  5. Patient is to return to the clinic in 3 months.   Edrick Kins, DPM [facility]  Dr. Edrick Kins, DPM    [address], [place] [address]                Office [phone]  Fax [phone]

## 2021-04-07 ENCOUNTER — Other Ambulatory Visit: Payer: Self-pay | Admitting: Dermatology

## 2021-05-05 DIAGNOSIS — Z961 Presence of intraocular lens: Secondary | ICD-10-CM | POA: Diagnosis not present

## 2021-05-06 NOTE — Progress Notes (Signed)
? ?05/07/21 ?8:27 AM  ? ?Todd Bennett ?05/14/1932 ?[id number] ? ?Referring provider:  ?[name], MD ?[address] ?[place],  [place] [address] ?Chief Complaint  ?Patient presents with  ? Benign Prostatic Hypertrophy  ? ? ? ?HPI: ?Todd Bennett is a 88 y.o.male with a personal history of  BPH with BOO, and recurrent hematuria, who presents today for a 1 year follow-up.   ? ?He underwent CT urogram on 10/17/2017 which showed incidental bilateral nonobstructing stones. He was also noted to have massive prostamegaly with prostate measuring 5.5 x 5.7 x 7.4 cm with median lobe hypertrophy.  Incidental multiple renal cysts are also visualized, none of which were suspicious.  ? ?His last upper tract imagine was on 02/09/2018 in the form of CT abdomen and pelvis and it visualized simple left renal exophytic cyst. Right midpole nephrolithiasis measuring 4-5 millimeters. Punctate left nephrolithiasis.  ? ?We discussed as HoLEP for a 120 g prostate in the past he did not end up following through with.  ? ?He reports that his wife recently passed. Patient denies any modifying or aggravating factors.  Patient denies any gross hematuria, dysuria or suprapubic/flank pain.  Patient denies any fevers, chills, nausea or vomiting.  ? ? ? IPSS   ? ? Dexter Name 05/07/21 1100  ?  ?  ?  ? International Prostate Symptom Score  ? How often have you had the sensation of not emptying your bladder? Less than half the time    ? How often have you had to urinate less than every two hours? About half the time    ? How often have you found you stopped and started again several times when you urinated? Less than half the time    ? How often have you found it difficult to postpone urination? More than half the time    ? How often have you had a weak urinary stream? Less than half the time    ? How often have you had to strain to start urination? Less than half the time    ? How many times did you typically get up at night to urinate? 2 Times    ? Total  IPSS Score 17    ?  ? Quality of Life due to urinary symptoms  ? If you were to spend the rest of your life with your urinary condition just the way it is now how would you feel about that? Mixed    ? ?  ?  ? ?  ? ? ?Score:  ?1-7 Mild ?8-19 Moderate ?20-35 Severe ? ? ?PMH: ?Past Medical History:  ?Diagnosis Date  ? Actinic keratosis   ? Allergy   ? Anxiety 06/17/2014  ? Basal cell carcinoma 06/24/2009  ? Left nose sup. nasal alar crease.   ? Basal cell carcinoma 09/11/2012  ? Left lateral bicep. Focal sclerosis. Excised: 11/01/2012, margins free.  ? Basal cell carcinoma 02/19/2021  ? R preauricular - ED&C  ? Benign prostatic hyperplasia with urinary obstruction 06/17/2014  ? Benign prostatic hypertrophy without urinary obstruction 06/20/2013  ? Bladder calculi 04/26/2013  ? Calculi, ureter 03/29/2013  ? Calculus of kidney 02/25/2013  ? Cataract   ? Clinical depression 06/17/2014  ? Degenerative disc disease, lumbar   ? Pilar Plate hematuria 06/28/2013  ? HLD (hyperlipidemia) 06/17/2014  ? Hx of squamous cell carcinoma   ? multiple sites  ? Spondylolysis   ? Squamous cell carcinoma of skin 09/11/2012  ? Right lateral antecubital area.  SCCis.   ? Squamous cell carcinoma of skin 03/10/2015  ? Right temple hair line. SCCis  ? Squamous cell carcinoma of skin 07/26/2016  ? Right temple hair line. SCCis, hypertrophic.  ? ? ?Surgical History: ?Past Surgical History:  ?Procedure Laterality Date  ? CORONARY STENT INTERVENTION N/A 03/15/2017  ? Procedure: CORONARY STENT INTERVENTION;  Surgeon: Yolonda Kida, MD;  Location: [facility];  Service: Cardiovascular;  Laterality: N/A;  ? HERNIA REPAIR  1993  ? ILEOSTOMY    ? IR ANGIOGRAM EXTREMITY LEFT  02/10/2018  ? IR ANGIOGRAM SELECTIVE EACH ADDITIONAL VESSEL  02/10/2018  ? IR ANGIOGRAM SELECTIVE EACH ADDITIONAL VESSEL  02/10/2018  ? IR ANGIOGRAM SELECTIVE EACH ADDITIONAL VESSEL  02/10/2018  ? IR EMBO VENOUS NOT HEMORR HEMANG  INC GUIDE ROADMAPPING  02/10/2018  ? IR US GUIDE  VASC ACCESS RIGHT  02/10/2018  ? LEFT HEART CATH AND CORONARY ANGIOGRAPHY N/A 03/15/2017  ? Procedure: LEFT HEART CATH AND CORONARY ANGIOGRAPHY;  Surgeon: Teodoro Spray, MD;  Location: [facility];  Service: Cardiovascular;  Laterality: N/A;  ? LITHOTRIPSY  03/2013  ? ? ?Home Medications:  ?Allergies as of 05/07/2021   ? ?   Reactions  ? Ciprofloxacin Shortness Of Breath, Swelling  ? Tongue thickened  ? Clarithromycin Shortness Of Breath, Swelling  ? Tongue thickened  ? Elemental Sulfur Shortness Of Breath, Swelling  ? Tongue thicened  ? Oxycodone Shortness Of Breath, Swelling  ? Tongue thickened  ? Penicillins Shortness Of Breath, Swelling  ? Tongue thickened and knots on bottom of feet  ? ?  ? ?  ?Medication List  ?  ? ?  ? Accurate as of May 07, 2021 11:59 PM. If you have any questions, ask your nurse or doctor.  ?  ?  ? ?  ? ?aspirin EC 81 MG tablet ?Take 81 mg by mouth every morning. ?  ?atorvastatin 40 MG tablet ?Commonly known as: LIPITOR ?Take 1 tablet (40 mg total) by mouth daily at 6 PM. ?  ?atorvastatin 40 MG tablet ?Commonly known as: LIPITOR ?Take 1 tablet by mouth daily. ?  ?celecoxib 200 MG capsule ?Commonly known as: CELEBREX ?TAKE 1 CAPSULE BY MOUTH TWICE DAILY ?  ?Cholecalciferol 25 MCG (1000 UT) tablet ?Take 1,000 Units by mouth every morning. ?  ?diazepam 5 MG tablet ?Commonly known as: VALIUM ?Take 2.5 mg by mouth daily as needed for anxiety. ?  ?finasteride 5 MG tablet ?Commonly known as: PROSCAR ?TAKE ONE TABLET EVERY DAY ?  ?Fish Oil 1000 MG Caps ?Take 1,000 mg by mouth daily. ?  ?FLUoxetine 20 MG capsule ?Commonly known as: PROZAC ?Take 20 mg by mouth every morning. Take with '10mg'$  capsule to equal '30mg'$  ?  ?FLUoxetine 10 MG capsule ?Commonly known as: PROZAC ?Take 10 mg by mouth every morning. Take with '20mg'$  capsule to equal '30mg'$  ?  ?metoprolol tartrate 25 MG tablet ?Commonly known as: LOPRESSOR ?Take 0.5 tablets (12.5 mg total) by mouth 2 (two) times daily. ?  ?metroNIDAZOLE 1 %  gel ?Commonly known as: METROGEL ?Apply 1 application topically every morning. ?  ?metroNIDAZOLE 0.75 % gel ?Commonly known as: METROGEL ?APPLY TO AFFECTED AREAS AS DIRECTED AT BEDTIME ?  ?mirtazapine 7.5 MG tablet ?Commonly known as: REMERON ?Take 7.5 mg by mouth at bedtime. ?  ?Multi-Vitamins Tabs ?Take 1 tablet by mouth every morning. ?  ?omeprazole 20 MG capsule ?Commonly known as: PRILOSEC ?Take 20 mg by mouth daily. ?  ?prednisoLONE acetate 1 %  ophthalmic suspension ?Commonly known as: PRED FORTE ?Place 1 drop into the right eye daily. ?  ?Prosacea Gel ?Apply 1 application topically every morning. ?  ?tamsulosin 0.4 MG Caps capsule ?Commonly known as: FLOMAX ?Take 0.4 mg by mouth every evening. ?  ?venlafaxine XR 75 MG 24 hr capsule ?Commonly known as: EFFEXOR-XR ?Take 225 mg by mouth every morning. ?  ?vitamin C 1000 MG tablet ?Take 1,000 mg by mouth every morning. ?  ? ?  ? ? ?Allergies:  ?Allergies  ?Allergen Reactions  ? Ciprofloxacin Shortness Of Breath and Swelling  ?  Tongue thickened  ? Clarithromycin Shortness Of Breath and Swelling  ?  Tongue thickened  ? Elemental Sulfur Shortness Of Breath and Swelling  ?  Tongue thicened  ? Oxycodone Shortness Of Breath and Swelling  ?  Tongue thickened  ? Penicillins Shortness Of Breath and Swelling  ?  Tongue thickened and knots on bottom of feet  ? ? ?Family History: ?Family History  ?Problem Relation Age of Onset  ? Diabetes Mother   ? Prostate cancer Neg Hx   ? Bladder Cancer Neg Hx   ? ? ?Social History:  reports that he has quit smoking. He has never used smokeless tobacco. He reports that he does not drink alcohol and does not use drugs. ? ? ?Physical Exam: ?BP (!) 171/73   Pulse 73   Ht '5\' 11"'$  (1.803 m)   Wt 165 lb (74.8 kg)   BMI 23.01 kg/m?   ?Constitutional:  Alert and oriented, No acute distress. ?HEENT: [place] AT, moist mucus membranes.  Trachea midline, no masses. ?Cardiovascular: No clubbing, cyanosis, or edema. ?Respiratory: Normal respiratory  effort, no increased work of breathing. ?Skin: No rashes, bruises or suspicious lesions. ?Neurologic: Grossly intact, no focal deficits, moving all 4 extremities. ?Psychiatric: Normal mood and affect. ? ?[facility]

## 2021-05-07 ENCOUNTER — Ambulatory Visit: Payer: PPO | Admitting: Urology

## 2021-05-07 VITALS — BP 171/73 | HR 73 | Ht 71.0 in | Wt 165.0 lb

## 2021-05-07 DIAGNOSIS — N401 Enlarged prostate with lower urinary tract symptoms: Secondary | ICD-10-CM

## 2021-05-07 DIAGNOSIS — N138 Other obstructive and reflux uropathy: Secondary | ICD-10-CM | POA: Diagnosis not present

## 2021-05-07 DIAGNOSIS — N400 Enlarged prostate without lower urinary tract symptoms: Secondary | ICD-10-CM

## 2021-05-07 LAB — BLADDER SCAN AMB NON-IMAGING: Scan Result: 41

## 2021-05-19 ENCOUNTER — Ambulatory Visit: Payer: PPO | Admitting: Dermatology

## 2021-05-19 DIAGNOSIS — L820 Inflamed seborrheic keratosis: Secondary | ICD-10-CM | POA: Diagnosis not present

## 2021-05-19 DIAGNOSIS — L719 Rosacea, unspecified: Secondary | ICD-10-CM

## 2021-05-19 DIAGNOSIS — Z85828 Personal history of other malignant neoplasm of skin: Secondary | ICD-10-CM

## 2021-05-19 DIAGNOSIS — L570 Actinic keratosis: Secondary | ICD-10-CM

## 2021-05-19 MED ORDER — METRONIDAZOLE 0.75 % EX GEL: CUTANEOUS | 11 refills | Status: DC

## 2021-05-19 NOTE — Patient Instructions (Signed)

## 2021-05-19 NOTE — Progress Notes (Signed)
? ?Follow-Up Visit ?  ?Subjective  ?Todd Bennett is a 88 y.o. male who presents for the following: Follow-up (3 months f/u hx of Aks on the face, hx of BCC on the right preauricular removed and treated 02/19/2021). Refill Metro gel for Rosacea pt using with a good response.  ?The patient has spots, moles and lesions to be evaluated, some may be new or changing and the patient has concerns that these could be cancer. ? ?The following portions of the chart were reviewed this encounter and updated as appropriate:  ? Tobacco  Allergies  Meds  Problems  Med Hx  Surg Hx  Fam Hx   ?  ?Review of Systems:  No other skin or systemic complaints except as noted in HPI or Assessment and Plan. ? ?Objective  ?Well appearing patient in no apparent distress; mood and affect are within normal limits. ? ?A focused examination was performed including face,scalp,ears. Relevant physical exam findings are noted in the Assessment and Plan. ? ?scalp, face  (5) (5) ?Erythematous thin papules/macules with gritty scale.  ? ?right temle within hairline x 1, left post ear x 1  (2) (2) ?Stuck-on, waxy, tan-brown papules ? ?face ?Mid face erythema with telangiectasias +/- scattered inflammatory papules.  ? ? ?Assessment & Plan  ?AK (actinic keratosis) (5) ?scalp, face  (5) ?Actinic keratoses are precancerous spots that appear secondary to cumulative UV radiation exposure/sun exposure over time. They are chronic with expected duration over 1 year. A portion of actinic keratoses will progress to squamous cell carcinoma of the skin. It is not possible to reliably predict which spots will progress to skin cancer and so treatment is recommended to prevent development of skin cancer. ? ?Recommend daily broad spectrum sunscreen SPF 30+ to sun-exposed areas, reapply every 2 hours as needed.  ?Recommend staying in the shade or wearing long sleeves, sun glasses (UVA+UVB protection) and wide brim hats (4-inch brim around the entire circumference of the  hat). ?Call for new or changing lesions.  ? ?Destruction of lesion - scalp, face  (5) ?Complexity: simple   ?Destruction method: cryotherapy   ?Informed consent: discussed and consent obtained   ?Timeout:  patient name, date of birth, surgical site, and procedure verified ?Lesion destroyed using liquid nitrogen: Yes   ?Region frozen until ice ball extended beyond lesion: Yes   ?Outcome: patient tolerated procedure well with no complications   ?Post-procedure details: wound care instructions given   ? ?Inflamed seborrheic keratosis (2) ?right temle within hairline x 1, left post ear x 1  (2) ?Reassured benign age-related growth.  Recommend observation.  Discussed cryotherapy if spot(s) become irritated or inflamed.  ? ?ISK with hyperkeratosis on the left preauricular  ? ?Start sample  of otc CLN wash apply to left preauricular once a day  ?Start sample of otc CLN lotion apply to left preauricular once a day  ? ?Destruction of lesion - right temle within hairline x 1, left post ear x 1  (2) ?Complexity: simple   ?Destruction method: cryotherapy   ?Informed consent: discussed and consent obtained   ?Timeout:  patient name, date of birth, surgical site, and procedure verified ?Lesion destroyed using liquid nitrogen: Yes   ?Region frozen until ice ball extended beyond lesion: Yes   ?Outcome: patient tolerated procedure well with no complications   ?Post-procedure details: wound care instructions given   ? ?Rosacea ?face ?Rosacea is a chronic progressive skin condition usually affecting the face of adults, causing redness and/or acne bumps. It  is treatable but not curable. It sometimes affects the eyes (ocular rosacea) as well. It may respond to topical and/or systemic medication and can flare with stress, sun exposure, alcohol, exercise and some foods.  Daily application of broad spectrum spf 30+ sunscreen to face is recommended to reduce flares.  ? ?Cont Metro gel apply to face bid ? ?Related Medications ?metroNIDAZOLE  (METROGEL) 0.75 % gel ?Apply to face twice a day ? ?History of Basal Cell Carcinoma of the Skin ?Right preauricular 02/19/2021 ?- No evidence of recurrence today ?- Recommend regular full body skin exams ?- Recommend daily broad spectrum sunscreen SPF 30+ to sun-exposed areas, reapply every 2 hours as needed.  ?- Call if any new or changing lesions are noted between office visits  ? ?Return in about 6 months (around 11/18/2021) for TBSE, hx of BCC,hx of AKs. ? ?I, Todd Bennett, CMA, am acting as scribe for Sarina Ser, MD .  ?Documentation: I have reviewed the above documentation for accuracy and completeness, and I agree with the above. ? ?Sarina Ser, MD ? ?

## 2021-05-26 ENCOUNTER — Encounter: Payer: Self-pay | Admitting: Dermatology

## 2021-06-08 DIAGNOSIS — I2510 Atherosclerotic heart disease of native coronary artery without angina pectoris: Secondary | ICD-10-CM | POA: Diagnosis not present

## 2021-06-08 DIAGNOSIS — E785 Hyperlipidemia, unspecified: Secondary | ICD-10-CM | POA: Diagnosis not present

## 2021-06-26 ENCOUNTER — Ambulatory Visit: Payer: PPO | Admitting: Podiatry

## 2021-06-26 DIAGNOSIS — B351 Tinea unguium: Secondary | ICD-10-CM | POA: Diagnosis not present

## 2021-06-26 DIAGNOSIS — M79676 Pain in unspecified toe(s): Secondary | ICD-10-CM | POA: Diagnosis not present

## 2021-06-26 DIAGNOSIS — L989 Disorder of the skin and subcutaneous tissue, unspecified: Secondary | ICD-10-CM

## 2021-07-07 NOTE — Progress Notes (Signed)
    Subjective: Patient is a 88 y.o. male presenting to the office today for follow up evaluation of painful callus lesion(s) noted to the right foot. Walking and bearing weight increases the pain. He has not done anything at home for treatment.   Patient also complains of elongated, thickened nails that cause pain while ambulating in shoes. He is unable to trim his own nails. Patient presents today for further treatment and evaluation.  Past Medical History:  Diagnosis Date   Actinic keratosis    Allergy    Anxiety 06/17/2014   Basal cell carcinoma 06/24/2009   Left nose sup. nasal alar crease.    Basal cell carcinoma 09/11/2012   Left lateral bicep. Focal sclerosis. Excised: 11/01/2012, margins free.   Basal cell carcinoma 02/19/2021   R preauricular - ED&C   Benign prostatic hyperplasia with urinary obstruction 06/17/2014   Benign prostatic hypertrophy without urinary obstruction 06/20/2013   Bladder calculi 04/26/2013   Calculi, ureter 03/29/2013   Calculus of kidney 02/25/2013   Cataract    Clinical depression 06/17/2014   Degenerative disc disease, lumbar    Frank hematuria 06/28/2013   HLD (hyperlipidemia) 06/17/2014   Hx of squamous cell carcinoma    multiple sites   Spondylolysis    Squamous cell carcinoma of skin 09/11/2012   Right lateral antecubital area. SCCis.    Squamous cell carcinoma of skin 03/10/2015   Right temple hair line. SCCis   Squamous cell carcinoma of skin 07/26/2016   Right temple hair line. SCCis, hypertrophic.    Objective:  Physical Exam General: Alert and oriented x3 in no acute distress  Dermatology: Hyperkeratotic lesion(s) present on the right sub-first MPJ. Pain on palpation with a central nucleated core noted. Skin is warm, dry and supple bilateral lower extremities. Negative for open lesions or macerations. Nails are tender, long, thickened and dystrophic with subungual debris, consistent with onychomycosis, 1-5 bilateral. No signs of  infection noted.  Vascular: Palpable pedal pulses bilaterally. No edema or erythema noted. Capillary refill within normal limits.  Neurological: Epicritic and protective threshold grossly intact bilaterally.   Musculoskeletal Exam: Pain on palpation at the keratotic lesion(s) noted. Range of motion within normal limits bilateral. Muscle strength 5/5 in all groups bilateral.  Assessment: 1. Onychodystrophic nails 1-5 bilateral with hyperkeratosis of nails.  2. Onychomycosis of nail due to dermatophyte bilateral 3. Pre-ulcerative callus lesion noted to the sub-1st MPJ b/l    Plan of Care:  1. Patient evaluated. 2. Excisional debridement of keratoic lesion(s) using a chisel blade was performed without incident as a courtesy for the patient.  3. Dressed with light dressing. 4. Mechanical debridement of nails 1-5 bilaterally performed using a nail nipper. Filed with dremel without incident.  5. Patient is to return to the clinic in 3 months.   Edrick Kins, DPM [facility]  Dr. Edrick Kins, DPM    [address], [place] [address]                Office [phone]  Fax [phone]

## 2021-07-20 DIAGNOSIS — Z933 Colostomy status: Secondary | ICD-10-CM | POA: Diagnosis not present

## 2021-09-01 ENCOUNTER — Encounter: Payer: Self-pay | Admitting: Emergency Medicine

## 2021-09-01 ENCOUNTER — Inpatient Hospital Stay
Admission: EM | Admit: 2021-09-01 | Discharge: 2021-09-03 | DRG: 282 | Disposition: A | Discharge status: Home / Self Care | Payer: PPO | Attending: Osteopathic Medicine | Admitting: Osteopathic Medicine

## 2021-09-01 ENCOUNTER — Other Ambulatory Visit: Payer: Self-pay

## 2021-09-01 ENCOUNTER — Emergency Department: Payer: PPO

## 2021-09-01 DIAGNOSIS — J439 Emphysema, unspecified: Secondary | ICD-10-CM | POA: Diagnosis not present

## 2021-09-01 DIAGNOSIS — F419 Anxiety disorder, unspecified: Secondary | ICD-10-CM | POA: Diagnosis present

## 2021-09-01 DIAGNOSIS — F32A Depression, unspecified: Secondary | ICD-10-CM | POA: Diagnosis present

## 2021-09-01 DIAGNOSIS — Z79899 Other long term (current) drug therapy: Secondary | ICD-10-CM

## 2021-09-01 DIAGNOSIS — I214 Non-ST elevation (NSTEMI) myocardial infarction: Principal | ICD-10-CM | POA: Diagnosis present

## 2021-09-01 DIAGNOSIS — N400 Enlarged prostate without lower urinary tract symptoms: Secondary | ICD-10-CM | POA: Diagnosis present

## 2021-09-01 DIAGNOSIS — Z882 Allergy status to sulfonamides status: Secondary | ICD-10-CM

## 2021-09-01 DIAGNOSIS — Z885 Allergy status to narcotic agent status: Secondary | ICD-10-CM

## 2021-09-01 DIAGNOSIS — Z933 Colostomy status: Secondary | ICD-10-CM

## 2021-09-01 DIAGNOSIS — Z881 Allergy status to other antibiotic agents status: Secondary | ICD-10-CM | POA: Diagnosis not present

## 2021-09-01 DIAGNOSIS — R0789 Other chest pain: Secondary | ICD-10-CM | POA: Diagnosis not present

## 2021-09-01 DIAGNOSIS — Z791 Long term (current) use of non-steroidal anti-inflammatories (NSAID): Secondary | ICD-10-CM | POA: Diagnosis not present

## 2021-09-01 DIAGNOSIS — I252 Old myocardial infarction: Secondary | ICD-10-CM

## 2021-09-01 DIAGNOSIS — Z66 Do not resuscitate: Secondary | ICD-10-CM | POA: Diagnosis present

## 2021-09-01 DIAGNOSIS — Z880 Allergy status to penicillin: Secondary | ICD-10-CM

## 2021-09-01 DIAGNOSIS — L570 Actinic keratosis: Secondary | ICD-10-CM | POA: Diagnosis present

## 2021-09-01 DIAGNOSIS — R079 Chest pain, unspecified: Secondary | ICD-10-CM | POA: Diagnosis not present

## 2021-09-01 DIAGNOSIS — Z87891 Personal history of nicotine dependence: Secondary | ICD-10-CM | POA: Diagnosis not present

## 2021-09-01 DIAGNOSIS — Z955 Presence of coronary angioplasty implant and graft: Secondary | ICD-10-CM | POA: Diagnosis not present

## 2021-09-01 DIAGNOSIS — I200 Unstable angina: Secondary | ICD-10-CM | POA: Diagnosis not present

## 2021-09-01 DIAGNOSIS — I2510 Atherosclerotic heart disease of native coronary artery without angina pectoris: Secondary | ICD-10-CM | POA: Diagnosis present

## 2021-09-01 DIAGNOSIS — K219 Gastro-esophageal reflux disease without esophagitis: Secondary | ICD-10-CM | POA: Diagnosis present

## 2021-09-01 DIAGNOSIS — Z7982 Long term (current) use of aspirin: Secondary | ICD-10-CM | POA: Diagnosis not present

## 2021-09-01 DIAGNOSIS — E785 Hyperlipidemia, unspecified: Secondary | ICD-10-CM | POA: Diagnosis present

## 2021-09-01 DIAGNOSIS — I10 Essential (primary) hypertension: Secondary | ICD-10-CM | POA: Diagnosis present

## 2021-09-01 LAB — CBC
HCT: 41.7 % (ref 39.0–52.0)
Hemoglobin: 13.7 g/dL (ref 13.0–17.0)
MCH: 29.3 pg (ref 26.0–34.0)
MCHC: 32.9 g/dL (ref 30.0–36.0)
MCV: 89.1 fL (ref 80.0–100.0)
Platelets: 146 10*3/uL — ABNORMAL LOW (ref 150–400)
RBC: 4.68 MIL/uL (ref 4.22–5.81)
RDW: 12.7 % (ref 11.5–15.5)
WBC: 6.3 10*3/uL (ref 4.0–10.5)
nRBC: 0 % (ref 0.0–0.2)

## 2021-09-01 LAB — PROTIME-INR
INR: 1.1 (ref 0.8–1.2)
Prothrombin Time: 13.6 seconds (ref 11.4–15.2)

## 2021-09-01 LAB — BASIC METABOLIC PANEL
Anion gap: 5 (ref 5–15)
BUN: 25 mg/dL — ABNORMAL HIGH (ref 8–23)
CO2: 26 mmol/L (ref 22–32)
Calcium: 9.1 mg/dL (ref 8.9–10.3)
Chloride: 109 mmol/L (ref 98–111)
Creatinine, Ser: 1.1 mg/dL (ref 0.61–1.24)
GFR, Estimated: >60 mL/min (ref >60)
Glucose, Bld: 114 mg/dL — ABNORMAL HIGH (ref 70–99)
Potassium: 4.5 mmol/L (ref 3.5–5.1)
Sodium: 140 mmol/L (ref 135–145)

## 2021-09-01 LAB — TROPONIN I (HIGH SENSITIVITY)
Troponin I (High Sensitivity): 50 ng/L — ABNORMAL HIGH (ref <18)
Troponin I (High Sensitivity): 115 ng/L (ref <18)
Troponin I (High Sensitivity): 190 ng/L (ref <18)
Troponin I (High Sensitivity): 184 ng/L (ref <18)

## 2021-09-01 LAB — APTT: aPTT: 31 seconds (ref 24–36)

## 2021-09-01 MED ORDER — ONDANSETRON HCL 4 MG/2ML IJ SOLN: 4.0000 mg | Freq: Four times a day (QID) | INTRAMUSCULAR | Status: DC | PRN

## 2021-09-01 MED ORDER — FLUOXETINE HCL 10 MG PO CAPS
10.0000 mg | ORAL_CAPSULE | Freq: Every morning | ORAL | Status: DC
  Administered 2021-09-02 – 2021-09-03 (×2): 10 mg via ORAL
  Filled 2021-09-01 (×2): qty 1

## 2021-09-01 MED ORDER — DIAZEPAM 5 MG PO TABS: 2.5000 mg | ORAL_TABLET | Freq: Every day | ORAL | Status: DC | PRN

## 2021-09-01 MED ORDER — NITROGLYCERIN 0.4 MG SL SUBL: 0.4000 mg | SUBLINGUAL_TABLET | SUBLINGUAL | Status: DC | PRN

## 2021-09-01 MED ORDER — FINASTERIDE 5 MG PO TABS
5.0000 mg | ORAL_TABLET | Freq: Every day | ORAL | Status: DC
  Administered 2021-09-01 – 2021-09-02 (×2): 5 mg via ORAL
  Filled 2021-09-01 (×2): qty 1

## 2021-09-01 MED ORDER — OMEGA-3-ACID ETHYL ESTERS 1 G PO CAPS
1000.0000 mg | ORAL_CAPSULE | Freq: Every day | ORAL | Status: DC
  Administered 2021-09-02 – 2021-09-03 (×2): 1000 mg via ORAL
  Filled 2021-09-01 (×2): qty 1

## 2021-09-01 MED ORDER — METOPROLOL TARTRATE 25 MG PO TABS
12.5000 mg | ORAL_TABLET | Freq: Two times a day (BID) | ORAL | Status: DC
  Administered 2021-09-01 – 2021-09-03 (×4): 12.5 mg via ORAL
  Filled 2021-09-01 (×4): qty 1

## 2021-09-01 MED ORDER — FLUOXETINE HCL 20 MG PO CAPS
20.0000 mg | ORAL_CAPSULE | Freq: Every morning | ORAL | Status: DC
  Administered 2021-09-02 – 2021-09-03 (×2): 20 mg via ORAL
  Filled 2021-09-01 (×2): qty 1

## 2021-09-01 MED ORDER — ADULT MULTIVITAMIN W/MINERALS CH
1.0000 | ORAL_TABLET | Freq: Every day | ORAL | Status: DC
  Administered 2021-09-02 – 2021-09-03 (×2): 1 via ORAL
  Filled 2021-09-01 (×2): qty 1

## 2021-09-01 MED ORDER — VENLAFAXINE HCL ER 75 MG PO CP24
225.0000 mg | ORAL_CAPSULE | Freq: Every morning | ORAL | Status: DC
  Administered 2021-09-02 – 2021-09-03 (×2): 225 mg via ORAL
  Filled 2021-09-01: qty 1
  Filled 2021-09-01: qty 3

## 2021-09-01 MED ORDER — ATORVASTATIN CALCIUM 20 MG PO TABS
40.0000 mg | ORAL_TABLET | Freq: Every day | ORAL | Status: DC
  Administered 2021-09-01 – 2021-09-02 (×2): 40 mg via ORAL
  Filled 2021-09-01 (×2): qty 2

## 2021-09-01 MED ORDER — ACETAMINOPHEN 325 MG PO TABS: 650.0000 mg | ORAL_TABLET | ORAL | Status: DC | PRN

## 2021-09-01 MED ORDER — PANTOPRAZOLE SODIUM 40 MG PO TBEC
40.0000 mg | DELAYED_RELEASE_TABLET | Freq: Every day | ORAL | Status: DC
  Administered 2021-09-02 – 2021-09-03 (×2): 40 mg via ORAL
  Filled 2021-09-01 (×2): qty 1

## 2021-09-01 MED ORDER — ASPIRIN 81 MG PO TBEC
81.0000 mg | DELAYED_RELEASE_TABLET | Freq: Every day | ORAL | Status: DC
  Administered 2021-09-02 – 2021-09-03 (×2): 81 mg via ORAL
  Filled 2021-09-01 (×2): qty 1

## 2021-09-01 MED ORDER — ASPIRIN 81 MG PO TBEC: 81.0000 mg | DELAYED_RELEASE_TABLET | ORAL | Status: DC

## 2021-09-01 MED ORDER — TAMSULOSIN HCL 0.4 MG PO CAPS
0.4000 mg | ORAL_CAPSULE | Freq: Every evening | ORAL | Status: DC
Start: 2021-09-01 — End: 2021-09-02
  Administered 2021-09-01: 0.4 mg via ORAL
  Filled 2021-09-01 (×2): qty 1

## 2021-09-01 MED ORDER — HEPARIN (PORCINE) 25000 UT/250ML-% IV SOLN
900.0000 [IU]/h | INTRAVENOUS | Status: AC
  Administered 2021-09-01 – 2021-09-02 (×2): 900 [IU]/h via INTRAVENOUS
  Filled 2021-09-01 (×2): qty 250

## 2021-09-01 MED ORDER — MIRTAZAPINE 15 MG PO TABS
7.5000 mg | ORAL_TABLET | Freq: Every day | ORAL | Status: DC
Start: 2021-09-01 — End: 2021-09-03
  Administered 2021-09-01 – 2021-09-02 (×2): 7.5 mg via ORAL
  Filled 2021-09-01 (×2): qty 1

## 2021-09-01 MED ORDER — HEPARIN BOLUS VIA INFUSION
4000.0000 [IU] | Freq: Once | INTRAVENOUS | Status: AC
  Administered 2021-09-01: 4000 [IU] via INTRAVENOUS
  Filled 2021-09-01: qty 4000

## 2021-09-01 NOTE — Assessment & Plan Note (Signed)
Treatment as outlined in 1 

## 2021-09-01 NOTE — Consult Note (Signed)
ANTICOAGULATION CONSULT NOTE - Initial Consult  Pharmacy Consult for Heparin Indication: chest pain/ACS  Allergies  Allergen Reactions   Ciprofloxacin Shortness Of Breath and Swelling    Tongue thickened   Clarithromycin Shortness Of Breath and Swelling    Tongue thickened   Elemental Sulfur Shortness Of Breath and Swelling    Tongue thicened   Oxycodone Shortness Of Breath and Swelling    Tongue thickened   Penicillins Shortness Of Breath and Swelling    Tongue thickened and knots on bottom of feet    Patient Measurements: Height: '5\' 11"'$  (180.3 cm) Weight: 74.8 kg (165 lb) IBW/kg (Calculated) : 75.3 Heparin Dosing Weight: 74.8 kg  Vital Signs: Temp: 98.2 F (36.8 C) (08/08 1400) Temp Source: Oral (08/08 1054) BP: 168/71 (08/08 1400) Pulse Rate: 60 (08/08 1400)  Labs: Recent Labs    09/01/21 1103 09/01/21 1350 09/01/21 1446  HGB 13.7  --   --   HCT 41.7  --   --   PLT 146*  --   --   APTT  --   --  31  LABPROT  --   --  13.6  INR  --   --  1.1  CREATININE 1.10  --   --   TROPONINIHS 50* 115*  --     Estimated Creatinine Clearance: 51 mL/min (by C-G formula based on SCr of 1.1 mg/dL).   Medical History: Past Medical History:  Diagnosis Date   Actinic keratosis    Allergy    Anxiety 06/17/2014   Basal cell carcinoma 06/24/2009   Left nose sup. nasal alar crease.    Basal cell carcinoma 09/11/2012   Left lateral bicep. Focal sclerosis. Excised: 11/01/2012, margins free.   Basal cell carcinoma 02/19/2021   R preauricular - ED&C   Benign prostatic hyperplasia with urinary obstruction 06/17/2014   Benign prostatic hypertrophy without urinary obstruction 06/20/2013   Bladder calculi 04/26/2013   Calculi, ureter 03/29/2013   Calculus of kidney 02/25/2013   Cataract    Clinical depression 06/17/2014   Degenerative disc disease, lumbar    Frank hematuria 06/28/2013   HLD (hyperlipidemia) 06/17/2014   Hx of squamous cell carcinoma    multiple sites    Spondylolysis    Squamous cell carcinoma of skin 09/11/2012   Right lateral antecubital area. SCCis.    Squamous cell carcinoma of skin 03/10/2015   Right temple hair line. SCCis   Squamous cell carcinoma of skin 07/26/2016   Right temple hair line. SCCis, hypertrophic.    Medications:  No history of chronic AC use PTA  Assessment: Pharmacy has been consulted to initiate and monitor heparin infusion in 88yo male with history of CAD s/p PCI with post-stent angioplasty in the RCA presenting to the ED with chest pain that started around 7 AM today after getting out of the shower. Troponin levels of 50>115.   Baseline labs: aPTT 31 sec, INR 1.1, Hgb 13.7, Plts 146  Goal of Therapy:  Heparin level 0.3-0.7 units/ml Monitor platelets by anticoagulation protocol: Yes   Plan:  Give 4000 units bolus x 1 Start heparin infusion at 900 units/hr Check anti-Xa level in 8 hours and daily while on heparin Continue to monitor H&H and platelets  Pearla Dubonnet, PharmD Clinical Pharmacist 09/01/2021 3:51 PM

## 2021-09-01 NOTE — Assessment & Plan Note (Signed)
Stable  Med rec notes fluoxetine 30 mg, venlafaxine 225 mg, Remeron  Discharged home on lower dose fluoxetine

## 2021-09-01 NOTE — Consult Note (Signed)
CARDIOLOGY CONSULT NOTE               Patient ID: Todd Bennett MRN: [id number] DOB/AGE: Aug 31, 1932 88 y.o.  Admit date: 09/01/2021 Referring Physician Dr. Francine Graven Primary Physician [name] primary Primary Cardiologist Jordan Hawks Reason for Consultation chest pain possible non-STEMI borderline troponins  HPI: Patient is a 72-year-old male with known coronary disease previous PCI stent 2019 RCA patient's had hypertension depression anxiety BPH stated started having chest pain sudden onset midsternal radiating to both arms.  Patient states he had some mild shortness of breath with some nausea pain was 6/10 took a nitroglycerin pill that was expired but is still relieved the pain patient alerted to [facility] staff who then called EMS he was brought to the emergency room for further evaluation and cardiology consultation was not recommended  Review of systems complete and found to be negative unless listed above     Past Medical History:  Diagnosis Date   Actinic keratosis    Allergy    Anxiety 06/17/2014   Basal cell carcinoma 06/24/2009   Left nose sup. nasal alar crease.    Basal cell carcinoma 09/11/2012   Left lateral bicep. Focal sclerosis. Excised: 11/01/2012, margins free.   Basal cell carcinoma 02/19/2021   R preauricular - ED&C   Benign prostatic hyperplasia with urinary obstruction 06/17/2014   Benign prostatic hypertrophy without urinary obstruction 06/20/2013   Bladder calculi 04/26/2013   Calculi, ureter 03/29/2013   Calculus of kidney 02/25/2013   Cataract    Clinical depression 06/17/2014   Degenerative disc disease, lumbar    Frank hematuria 06/28/2013   HLD (hyperlipidemia) 06/17/2014   Hx of squamous cell carcinoma    multiple sites   Spondylolysis    Squamous cell carcinoma of skin 09/11/2012   Right lateral antecubital area. SCCis.    Squamous cell carcinoma of skin 03/10/2015   Right temple hair line. SCCis   Squamous cell carcinoma of skin  07/26/2016   Right temple hair line. SCCis, hypertrophic.    Past Surgical History:  Procedure Laterality Date   CORONARY STENT INTERVENTION N/A 03/15/2017   Procedure: CORONARY STENT INTERVENTION;  Surgeon: Yolonda Kida, MD;  Location: [place] [facility];  Service: Cardiovascular;  Laterality: N/A;   HERNIA REPAIR  1993   ILEOSTOMY     IR ANGIOGRAM EXTREMITY LEFT  02/10/2018   IR ANGIOGRAM SELECTIVE EACH ADDITIONAL VESSEL  02/10/2018   IR ANGIOGRAM SELECTIVE EACH ADDITIONAL VESSEL  02/10/2018   IR ANGIOGRAM SELECTIVE EACH ADDITIONAL VESSEL  02/10/2018   IR EMBO VENOUS NOT HEMORR HEMANG  INC GUIDE ROADMAPPING  02/10/2018   IR US GUIDE VASC ACCESS RIGHT  02/10/2018   LEFT HEART CATH AND CORONARY ANGIOGRAPHY N/A 03/15/2017   Procedure: LEFT HEART CATH AND CORONARY ANGIOGRAPHY;  Surgeon: Teodoro Spray, MD;  Location: [place] [facility];  Service: Cardiovascular;  Laterality: N/A;   LITHOTRIPSY  03/2013    (Not in a hospital admission)  Social History   Socioeconomic History   Marital status: Married    Spouse name: Not on file   Number of children: Not on file   Years of education: Not on file   Highest education level: Not on file  Occupational History   Not on file  Tobacco Use   Smoking status: Former   Smokeless tobacco: Never  Vaping Use   Vaping Use: Never used  Substance and Sexual Activity   Alcohol use: No    Alcohol/week: 0.0  standard drinks of alcohol   Drug use: No   Sexual activity: Yes  Other Topics Concern   Not on file  Social History Narrative   Not on file   Social Determinants of Health   Financial Resource Strain: Not on file  Food Insecurity: Not on file  Transportation Needs: Not on file  Physical Activity: Not on file  Stress: Not on file  Social Connections: Not on file  Intimate Partner Violence: Not on file    Family History  Problem Relation Age of Onset   Diabetes Mother    Prostate cancer Neg Hx    Bladder Cancer Neg Hx        Review of systems complete and found to be negative unless listed above      PHYSICAL EXAM  General: Well developed, well nourished, in no acute distress HEENT:  Normocephalic and atramatic Neck:  No JVD.  Lungs: Clear bilaterally to auscultation and percussion. Heart: HRRR . Normal S1 and S2 without gallops or murmurs.  Abdomen: Bowel sounds are positive, abdomen soft and non-tender  Msk:  Back normal, normal gait. Normal strength and tone for age. Extremities: No clubbing, cyanosis or edema.   Neuro: Alert and oriented X 3. Psych:  Good affect, responds appropriately  Labs:   Lab Results  Component Value Date   WBC 6.3 09/01/2021   HGB 13.7 09/01/2021   HCT 41.7 09/01/2021   MCV 89.1 09/01/2021   PLT 146 (L) 09/01/2021    Recent Labs  Lab 09/01/21 1103  NA 140  K 4.5  CL 109  CO2 26  BUN 25*  CREATININE 1.10  CALCIUM 9.1  GLUCOSE 114*   Lab Results  Component Value Date   TROPONINI 0.37 (HH) 03/14/2017    Lab Results  Component Value Date   CHOL 181 03/14/2017   Lab Results  Component Value Date   HDL 43 03/14/2017   Lab Results  Component Value Date   LDLCALC 120 (H) 03/14/2017   Lab Results  Component Value Date   TRIG 90 03/14/2017   Lab Results  Component Value Date   CHOLHDL 4.2 03/14/2017   No results found for: "LDLDIRECT"    Radiology: DG Chest 2 View  Result Date: 09/01/2021 CLINICAL DATA:  Chest pain radiating to both arms. EXAM: CHEST - 2 VIEW COMPARISON:  02/09/2018 FINDINGS: Heart size is normal. Chronic aortic atherosclerosis. Advanced emphysema, upper lobe predominant, with bullous disease more extensive on the right than the left. No sign of active infiltrate, collapse or effusion. No pneumothorax. No pulmonary edema. IMPRESSION: Widespread and advanced emphysema, generally more severe in the right lung than the left. No acute process visible. Aortic atherosclerosis. Electronically Signed   By: Nelson Chimes M.D.   On:  09/01/2021 11:24    EKG: Normal sinus rhythm rate of 60 nonspecific ST-T wave changes  ASSESSMENT AND PLAN:  Non-STEMI Coronary artery disease History of PCI and stent Acute coronary syndrome Hyperlipidemia Former smoker BPH left microwave from this morning I left it in the microwave from this morning Depression  Plan Agreed admit follow-up EKGs troponins Recommend heparin therapy for anticoagulation for possible non-STEMI Echocardiogram for assessment left ventricular function Elevated troponins may be demand ischemia rather than a true non-STEMI Statin therapy for hyperlipidemia Known coronary disease previous PCI and stent now with chest pain and anginal type symptoms recommend noninvasive work-up for now unless troponins continue to rise after significant amount   Signed: Yolonda Kida MD 09/01/2021, 4:31  PM

## 2021-09-01 NOTE — ED Provider Notes (Signed)
[facility] Provider Note    Event Date/Time   First MD Initiated Contact with Patient 09/01/21 1401     (approximate)  History   Chief Complaint: Chest Pain  HPI  Todd Bennett is a 88 y.o. male with a past medical history as CAD, hyperlipidemia, anxiety, prior MI with stent presents to the emergency department for chest pain.  According to the patient he was getting ready today when he got out of shower and developed acute onset of chest pain along with nausea and some mild shortness of breath.  Patient states he took an expired nitroglycerin tablet that he had available and he states chest pain had resolved.  Patient denies any recurrence of chest pain.  Patient states he never gets chest pain and this was very atypical for him.  No leg pain or swelling.  No recent illness cough or congestion.  Physical Exam   Triage Vital Signs: ED Triage Vitals  Enc Vitals Group     BP 09/01/21 1054 (!) 133/57     Pulse Rate 09/01/21 1054 65     Resp 09/01/21 1054 19     Temp 09/01/21 1054 98.3 F (36.8 C)     Temp Source 09/01/21 1054 Oral     SpO2 09/01/21 1054 96 %     Weight 09/01/21 1059 165 lb (74.8 kg)     Height 09/01/21 1059 '5\' 11"'$  (1.803 m)     Head Circumference --      Peak Flow --      Pain Score --      Pain Loc --      Pain Edu? --      Excl. in [place]? --     Most recent vital signs: Vitals:   09/01/21 1054 09/01/21 1400  BP: (!) 133/57 (!) 168/71  Pulse: 65 60  Resp: 19 15  Temp: 98.3 F (36.8 C) 98.2 F (36.8 C)  SpO2: 96% 98%    General: Awake, no distress.  CV:  Good peripheral perfusion.  Regular rate and rhythm  Resp:  Normal effort.  Equal breath sounds bilaterally.  Abd:  No distention.  Soft, nontender.  No rebound or guarding.   ED Results / Procedures / Treatments   EKG  EKG viewed and interpreted by myself shows a normal sinus rhythm at 61 bpm with a narrow QRS, normal axis, normal intervals, nonspecific ST changes without  ST elevation.  RADIOLOGY  I have viewed and interpreted the x-ray images.  I do not see any obvious lung abnormality on my evaluation. Radiology has read the chest x-ray is widespread emphysema no acute process.   MEDICATIONS ORDERED IN ED: Medications - No data to display   IMPRESSION / MDM / [place] / ED COURSE  I reviewed the triage vital signs and the nursing notes.  Patient's presentation is most consistent with acute presentation with potential threat to life or bodily function.  Patient presents emergency department for chest pain that occurred just prior to arrival.  Patient states a history of CAD with 1 prior stent years ago follows up with Dr. Clayborn Bigness.  Patient's chest pain came along with nausea and some mild shortness of breath resolved after nitroglycerin use at home.  Here the patient's work-up shows a stable chest x-ray, no concerning findings on EKG.  Patient CBC is reassuring, chemistry reassuring.  Patient's initial troponin of 50 however has now increased to 115 on recheck concerning for possible ACS.  Given the patient's worries some presentation of chest pain with his history and age we will start the patient on heparin and admit to the hospitalist service.  Patient and family agreeable to plan of care.  CRITICAL CARE Performed by: Harvest Dark   Total critical care time: 30 minutes  Critical care time was exclusive of separately billable procedures and treating other patients.  Critical care was necessary to treat or prevent imminent or life-threatening deterioration.  Critical care was time spent personally by me on the following activities: development of treatment plan with patient and/or surrogate as well as nursing, discussions with consultants, evaluation of patient's response to treatment, examination of patient, obtaining history from patient or surrogate, ordering and performing treatments and interventions, ordering and review of  laboratory studies, ordering and review of radiographic studies, pulse oximetry and re-evaluation of patient's condition.   FINAL CLINICAL IMPRESSION(S) / ED DIAGNOSES   NSTEMI  Note:  This document was prepared using Dragon voice recognition software and may include unintentional dictation errors.   Harvest Dark, MD 09/01/21 1430

## 2021-09-01 NOTE — Assessment & Plan Note (Signed)
Stable  Continue Flomax and finasteride

## 2021-09-01 NOTE — Progress Notes (Signed)
Brief Cardiology Consult  Impression Angina Chest Pain CAD Hx PCI stent GERD  Plan ROMI Heparin IV ASA/Metoprolol/Lipitor/ARB Lexiscan Myoview in am Continue Omeprazole for reflux

## 2021-09-01 NOTE — ED Notes (Signed)
Pt assisted with emptying ileostomy bag, and using urinal.  Pt changed into clean gown and evening meds given at this time.  Pt denies any other needs at this time.

## 2021-09-01 NOTE — ED Triage Notes (Signed)
First Nurse: Pt here via [facility] from home with cp that started at 0700 today. Pt took 1 baby aspirin and an expired nitro, given 324 with ems. Pain was a 7 initially but is now a 4. Pt lives alone and needed assistance to ems. Pt has a hx of MI.  62 94% RA 148/64

## 2021-09-01 NOTE — H&P (Addendum)
History and Physical    Patient: Todd Bennett FBP:[id number] DOB: [birth year] DOA: 09/01/2021 DOS: the patient was seen and examined on 09/01/2021 PCP: [name], MD  Patient coming from: ALF   Chief Complaint:  Chief Complaint  Patient presents with   Chest Pain   HPI: Todd Bennett is a 88 y.o. male with medical history significant for coronary artery disease status PCI with post stent angioplasty in the RCA, depression, anxiety, BPH who presents to the ER via EMS for evaluation of chest pain that started at about 7 AM after getting out of the shower. Patient states that he had sudden onset chest pain mostly midsternal radiating to both arms and associated with shortness of breath and nausea.  He rated his pain a 6 x 10 in intensity at its worst.  He took a nitroglycerin pill that had expired with complete resolution of his pain. He denied having any emesis, no palpitation or diaphoresis. He called his daughter who recommended that he notify the staff at [facility], [facility] where he resides and he did that and EMS arrived. He was transported to the ER for further evaluation. At the time of my evaluation he is chest pain-free. He denies having any fever, no chills, no cough, no dizziness, no lightheadedness, no blurred vision, no changes in his bowel habits, no urinary symptoms, no focal deficit. Labs show an uptrending troponin level 50 >> 115  Review of Systems: As mentioned in the history of present illness. All other systems reviewed and are negative. Past Medical History:  Diagnosis Date   Actinic keratosis    Allergy    Anxiety 06/17/2014   Basal cell carcinoma 06/24/2009   Left nose sup. nasal alar crease.    Basal cell carcinoma 09/11/2012   Left lateral bicep. Focal sclerosis. Excised: 11/01/2012, margins free.   Basal cell carcinoma 02/19/2021   R preauricular - ED&C   Benign prostatic hyperplasia with urinary obstruction 06/17/2014   Benign prostatic hypertrophy without urinary  obstruction 06/20/2013   Bladder calculi 04/26/2013   Calculi, ureter 03/29/2013   Calculus of kidney 02/25/2013   Cataract    Clinical depression 06/17/2014   Degenerative disc disease, lumbar    Frank hematuria 06/28/2013   HLD (hyperlipidemia) 06/17/2014   Hx of squamous cell carcinoma    multiple sites   Spondylolysis    Squamous cell carcinoma of skin 09/11/2012   Right lateral antecubital area. SCCis.    Squamous cell carcinoma of skin 03/10/2015   Right temple hair line. SCCis   Squamous cell carcinoma of skin 07/26/2016   Right temple hair line. SCCis, hypertrophic.   Past Surgical History:  Procedure Laterality Date   CORONARY STENT INTERVENTION N/A 03/15/2017   Procedure: CORONARY STENT INTERVENTION;  Surgeon: Yolonda Kida, MD;  Location: [place] [facility];  Service: Cardiovascular;  Laterality: N/A;   HERNIA REPAIR  1993   ILEOSTOMY     IR ANGIOGRAM EXTREMITY LEFT  02/10/2018   IR ANGIOGRAM SELECTIVE EACH ADDITIONAL VESSEL  02/10/2018   IR ANGIOGRAM SELECTIVE EACH ADDITIONAL VESSEL  02/10/2018   IR ANGIOGRAM SELECTIVE EACH ADDITIONAL VESSEL  02/10/2018   IR EMBO VENOUS NOT HEMORR HEMANG  INC GUIDE ROADMAPPING  02/10/2018   IR US GUIDE VASC ACCESS RIGHT  02/10/2018   LEFT HEART CATH AND CORONARY ANGIOGRAPHY N/A 03/15/2017   Procedure: LEFT HEART CATH AND CORONARY ANGIOGRAPHY;  Surgeon: Teodoro Spray, MD;  Location: [place] [facility];  Service: Cardiovascular;  Laterality: N/A;  LITHOTRIPSY  03/2013   Social History:  reports that he has quit smoking. He has never used smokeless tobacco. He reports that he does not drink alcohol and does not use drugs.  Allergies  Allergen Reactions   Ciprofloxacin Shortness Of Breath and Swelling    Tongue thickened   Clarithromycin Shortness Of Breath and Swelling    Tongue thickened   Elemental Sulfur Shortness Of Breath and Swelling    Tongue thicened   Oxycodone Shortness Of Breath and Swelling    Tongue thickened    Penicillins Shortness Of Breath and Swelling    Tongue thickened and knots on bottom of feet    Family History  Problem Relation Age of Onset   Diabetes Mother    Prostate cancer Neg Hx    Bladder Cancer Neg Hx     Prior to Admission medications   Medication Sig Start Date End Date Taking? Authorizing Provider  Ascorbic Acid (VITAMIN C) 1000 MG tablet Take 1,000 mg by mouth every morning.     [provider]  aspirin EC 81 MG tablet Take 81 mg by mouth every morning.     [provider]  atorvastatin (LIPITOR) 40 MG tablet Take 1 tablet (40 mg total) by mouth daily at 6 PM. 03/16/17   Epifanio Lesches, MD  atorvastatin (LIPITOR) 40 MG tablet Take 1 tablet by mouth daily. 04/02/21   [provider]  celecoxib (CELEBREX) 200 MG capsule TAKE 1 CAPSULE BY MOUTH TWICE DAILY 12/07/19   Edrick Kins, DPM  Cholecalciferol 25 MCG (1000 UT) tablet Take 1,000 Units by mouth every morning.     [provider]  diazepam (VALIUM) 5 MG tablet Take 2.5 mg by mouth daily as needed for anxiety.     [provider]  finasteride (PROSCAR) 5 MG tablet TAKE ONE TABLET EVERY DAY 08/03/18   Hollice Espy, MD  FLUoxetine (PROZAC) 10 MG capsule Take 10 mg by mouth every morning. Take with '20mg'$  capsule to equal '30mg'$     [provider]  FLUoxetine (PROZAC) 20 MG capsule Take 20 mg by mouth every morning. Take with '10mg'$  capsule to equal '30mg'$     [provider]  Homeopathic Products (PROSACEA) GEL Apply 1 application topically every morning.    [provider]  metoprolol tartrate (LOPRESSOR) 25 MG tablet Take 0.5 tablets (12.5 mg total) by mouth 2 (two) times daily. 03/16/17   Epifanio Lesches, MD  metroNIDAZOLE (METROGEL) 0.75 % gel Apply to face twice a day 05/19/21   Ralene Bathe, MD  metroNIDAZOLE (METROGEL) 1 % gel Apply 1 application topically every morning.     [provider]  mirtazapine (REMERON) 7.5 MG tablet Take 7.5  mg by mouth at bedtime. 03/02/21   [provider]  Multiple Vitamin (MULTI-VITAMINS) TABS Take 1 tablet by mouth every morning.     [provider]  Omega-3 Fatty Acids (FISH OIL) 1000 MG CAPS Take 1,000 mg by mouth daily.     [provider]  omeprazole (PRILOSEC) 20 MG capsule Take 20 mg by mouth daily.    [provider]  prednisoLONE acetate (PRED FORTE) 1 % ophthalmic suspension Place 1 drop into the right eye daily.    [provider]  tamsulosin (FLOMAX) 0.4 MG CAPS capsule Take 0.4 mg by mouth every evening.     [provider]  venlafaxine XR (EFFEXOR-XR) 75 MG 24 hr capsule Take 225 mg by mouth every morning. 02/09/18   [provider]    Physical Exam: Vitals:   09/01/21 1054 09/01/21 1059 09/01/21 1400  BP: (!) 133/57  (!) 168/71  Pulse: 65  60  Resp: 19  15  Temp: 98.3 F (36.8 C)  98.2 F (36.8 C)  TempSrc: Oral    SpO2: 96%  98%  Weight:  74.8 kg   Height:  '5\' 11"'$  (1.803 m)    Physical Exam Vitals reviewed.  Constitutional:      Appearance: He is well-developed.  HENT:     Head: Normocephalic and atraumatic.  Eyes:     Pupils: Pupils are equal, round, and reactive to light.  Cardiovascular:     Rate and Rhythm: Normal rate and regular rhythm.     Heart sounds: Normal heart sounds.  Pulmonary:     Effort: Pulmonary effort is normal.  Abdominal:     General: Bowel sounds are normal.     Palpations: Abdomen is soft.     Comments: Ileostomy  Musculoskeletal:        General: Normal range of motion.     Cervical back: Normal range of motion and neck supple.  Skin:    General: Skin is warm and dry.  Neurological:     General: No focal deficit present.     Mental Status: He is alert and oriented to person, place, and time.  Psychiatric:        Mood and Affect: Mood normal.        Behavior: Behavior normal.     Data Reviewed: Relevant notes from primary care and specialist visits, past discharge  summaries as available in EHR, including Care Everywhere. Prior diagnostic testing as pertinent to current admission diagnoses Updated medications and problem lists for reconciliation ED course, including vitals, labs, imaging, treatment and response to treatment Triage notes, nursing and pharmacy notes and ED provider's notes Notable results as noted in HPI Labs reviewed.  PT 13.6, INR 1.1, troponin 50 >> 115, sodium 140, potassium 4.5, chloride 109, bicarb 26, glucose 114, BUN 25, creatinine 1.10, calcium 9.1, white count 6.3, hemoglobin 13.7, hematocrit 41.7, platelet count 146 Chest x-ray reviewed by me shows widespread and advanced emphysema, generally more severe in the right lung than the left. No acute process visible. Aortic atherosclerosis. Twelve-lead EKG reviewed by me shows normal sinus rhythm.  Nonspecific T wave changes. There are no new results to review at this time.  Assessment and Plan: * NSTEMI (non-ST elevated myocardial infarction) [facility]) Patient with a history of coronary artery disease status post PCI with stent angioplasty to the RCA who presents for evaluation of sudden onset chest pain with radiation to both arms associated with nausea and shortness of breath that was relieved by sublingual nitroglycerin. Patient noted to have an uptrending troponin level Obtain 2D echocardiogram to assess LVEF and rule out regional wall motion abnormality Continue heparin drip initiated in the ER Continue aspirin, statins and metoprolol Consult cardiology  CAD (coronary artery disease) Treatment as outlined in 1  Clinical depression Stable Continue venlafaxine, Remeron and fluoxetine  Benign prostatic hyperplasia without urinary obstruction Stable Continue Flomax and finasteride      Advance Care Planning:   Code Status: DNR   Consults: Cardiology  Family Communication: Greater than 50% of time was spent discussing patient's condition and plan of care with him and his  daughter at the bedside.  All questions and concerns have been addressed.  They verbalized understanding and agree with the plan.  CODE STATUS was discussed and he  wishes to be a DO NOT RESUSCITATE. He lists his daughters as his healthcare power of attorney.  Severity of Illness: The appropriate patient status for this patient is INPATIENT. Inpatient status is judged to be reasonable and necessary in order to provide the required intensity of service to ensure the patient's safety. The patient's presenting symptoms, physical exam findings, and initial radiographic and laboratory data in the context of their chronic comorbidities is felt to place them at high risk for further clinical deterioration. Furthermore, it is not anticipated that the patient will be medically stable for discharge from the hospital within 2 midnights of admission.   * I certify that at the point of admission it is my clinical judgment that the patient will require inpatient hospital care spanning beyond 2 midnights from the point of admission due to high intensity of service, high risk for further deterioration and high frequency of surveillance required.*  Author: Collier Bullock, MD 09/01/2021 3:30 PM  For on call review [email or web address].

## 2021-09-01 NOTE — ED Triage Notes (Signed)
Patient to ED via [facility] from [facility] for Chest Pain. Patient states pain started after getting out of shower- took Nitro at home PTA. Patient denies any pain at this time but feels weak.

## 2021-09-01 NOTE — Assessment & Plan Note (Addendum)
Patient with a history of coronary artery disease status post PCI with stent angioplasty to the RCA who presents for evaluation of sudden onset chest pain with radiation to both arms associated with nausea and shortness of breath that was relieved by sublingual nitroglycerin. Patient noted to have an uptrending troponin level  Obtain 2D echocardiogram to assess LVEF and rule out regional wall motion abnormality --> no concerns per cardiology, official read pending in Epic   Continue heparin drip initiated in the ER for 48 hours --> finish 09/03/21 around 15:00  Continue aspirin, statin, ACE/ARB, metoprolol  Add Plavix x 6 mos (01/2022)  Cardiology following --> outpatient   Stress test --> no concerns   Lipid panel: LDL above goal --> adding Zetia  A1c: 5.5

## 2021-09-02 ENCOUNTER — Inpatient Hospital Stay
Admit: 2021-09-02 | Discharge: 2021-09-02 | Disposition: A | Discharge status: Home / Self Care | Payer: PPO | Attending: Internal Medicine | Admitting: Internal Medicine

## 2021-09-02 ENCOUNTER — Encounter: Payer: Self-pay | Admitting: Internal Medicine

## 2021-09-02 DIAGNOSIS — I214 Non-ST elevation (NSTEMI) myocardial infarction: Secondary | ICD-10-CM | POA: Diagnosis not present

## 2021-09-02 LAB — HEPARIN LEVEL (UNFRACTIONATED)
Heparin Unfractionated: 0.5 IU/mL (ref 0.30–0.70)
Heparin Unfractionated: 0.57 IU/mL (ref 0.30–0.70)

## 2021-09-02 LAB — HEMOGLOBIN A1C
Hgb A1c MFr Bld: 5.5 % (ref 4.8–5.6)
Mean Plasma Glucose: 111.15 mg/dL

## 2021-09-02 LAB — LIPID PANEL
Cholesterol: 119 mg/dL (ref 0–200)
HDL: 43 mg/dL (ref >40)
LDL Cholesterol: 60 mg/dL (ref 0–99)
Total CHOL/HDL Ratio: 2.8 RATIO
Triglycerides: 78 mg/dL (ref <150)
VLDL: 16 mg/dL (ref 0–40)

## 2021-09-02 LAB — CBC
HCT: 40.1 % (ref 39.0–52.0)
Hemoglobin: 13.5 g/dL (ref 13.0–17.0)
MCH: 30.3 pg (ref 26.0–34.0)
MCHC: 33.7 g/dL (ref 30.0–36.0)
MCV: 90.1 fL (ref 80.0–100.0)
Platelets: 142 10*3/uL — ABNORMAL LOW (ref 150–400)
RBC: 4.45 MIL/uL (ref 4.22–5.81)
RDW: 12.7 % (ref 11.5–15.5)
WBC: 6.5 10*3/uL (ref 4.0–10.5)
nRBC: 0 % (ref 0.0–0.2)

## 2021-09-02 LAB — BASIC METABOLIC PANEL
Anion gap: 3 — ABNORMAL LOW (ref 5–15)
BUN: 21 mg/dL (ref 8–23)
CO2: 27 mmol/L (ref 22–32)
Calcium: 8.7 mg/dL — ABNORMAL LOW (ref 8.9–10.3)
Chloride: 108 mmol/L (ref 98–111)
Creatinine, Ser: 0.86 mg/dL (ref 0.61–1.24)
GFR, Estimated: >60 mL/min (ref >60)
Glucose, Bld: 119 mg/dL — ABNORMAL HIGH (ref 70–99)
Potassium: 4.6 mmol/L (ref 3.5–5.1)
Sodium: 138 mmol/L (ref 135–145)

## 2021-09-02 MED ORDER — TAMSULOSIN HCL 0.4 MG PO CAPS
0.4000 mg | ORAL_CAPSULE | Freq: Every evening | ORAL | Status: DC
  Administered 2021-09-02: 0.4 mg via ORAL
  Filled 2021-09-02: qty 1

## 2021-09-02 NOTE — Consult Note (Signed)
ANTICOAGULATION CONSULT NOTE   Pharmacy Consult for Heparin Indication: chest pain/ACS  Allergies  Allergen Reactions   Ciprofloxacin Shortness Of Breath and Swelling    Tongue thickened   Clarithromycin Shortness Of Breath and Swelling    Tongue thickened   Elemental Sulfur Shortness Of Breath and Swelling    Tongue thicened   Oxycodone Shortness Of Breath and Swelling    Tongue thickened   Penicillins Shortness Of Breath and Swelling    Tongue thickened and knots on bottom of feet    Patient Measurements: Height: '5\' 11"'$  (180.3 cm) Weight: 74.8 kg (165 lb) IBW/kg (Calculated) : 75.3 Heparin Dosing Weight: 74.8 kg  Vital Signs: Temp: 98.4 F (36.9 C) (08/08 1850) Temp Source: Oral (08/08 1745) BP: 157/86 (08/08 2156) Pulse Rate: 68 (08/08 2156)  Labs: Recent Labs    09/01/21 1103 09/01/21 1350 09/01/21 1446 09/01/21 1630 09/01/21 1830 09/01/21 2308  HGB 13.7  --   --   --   --   --   HCT 41.7  --   --   --   --   --   PLT 146*  --   --   --   --   --   APTT  --   --  31  --   --   --   LABPROT  --   --  13.6  --   --   --   INR  --   --  1.1  --   --   --   HEPARINUNFRC  --   --   --   --   --  0.50  CREATININE 1.10  --   --   --   --   --   TROPONINIHS 50* 115*  --  190* 184*  --      Estimated Creatinine Clearance: 51 mL/min (by C-G formula based on SCr of 1.1 mg/dL).   Medical History: Past Medical History:  Diagnosis Date   Actinic keratosis    Allergy    Anxiety 06/17/2014   Basal cell carcinoma 06/24/2009   Left nose sup. nasal alar crease.    Basal cell carcinoma 09/11/2012   Left lateral bicep. Focal sclerosis. Excised: 11/01/2012, margins free.   Basal cell carcinoma 02/19/2021   R preauricular - ED&C   Benign prostatic hyperplasia with urinary obstruction 06/17/2014   Benign prostatic hypertrophy without urinary obstruction 06/20/2013   Bladder calculi 04/26/2013   Calculi, ureter 03/29/2013   Calculus of kidney 02/25/2013   Cataract     Clinical depression 06/17/2014   Degenerative disc disease, lumbar    Frank hematuria 06/28/2013   HLD (hyperlipidemia) 06/17/2014   Hx of squamous cell carcinoma    multiple sites   Spondylolysis    Squamous cell carcinoma of skin 09/11/2012   Right lateral antecubital area. SCCis.    Squamous cell carcinoma of skin 03/10/2015   Right temple hair line. SCCis   Squamous cell carcinoma of skin 07/26/2016   Right temple hair line. SCCis, hypertrophic.    Medications:  No history of chronic AC use PTA  Assessment: Pharmacy has been consulted to initiate and monitor heparin infusion in 88yo male with history of CAD s/p PCI with post-stent angioplasty in the RCA presenting to the ED with chest pain that started around 7 AM today after getting out of the shower. Troponin levels of 50>115.   Baseline labs: aPTT 31 sec, INR 1.1, Hgb 13.7, Plts 146  Goal of Therapy:  Heparin level 0.3-0.7 units/ml Monitor platelets by anticoagulation protocol: Yes   8/08 2308 HL 0.50, therapeutic x 1  Plan:  Continue heparin infusion at 900 units/hr Recheck w/ AM labs to confirm Continue to monitor H&H and platelets  Renda Rolls, PharmD, [facility] 09/02/2021 12:11 AM

## 2021-09-02 NOTE — Consult Note (Signed)
ANTICOAGULATION CONSULT NOTE   Pharmacy Consult for Heparin Indication: chest pain/ACS  Allergies  Allergen Reactions   Ciprofloxacin Shortness Of Breath and Swelling    Tongue thickened   Clarithromycin Shortness Of Breath and Swelling    Tongue thickened   Elemental Sulfur Shortness Of Breath and Swelling    Tongue thicened   Oxycodone Shortness Of Breath and Swelling    Tongue thickened   Penicillins Shortness Of Breath and Swelling    Tongue thickened and knots on bottom of feet    Patient Measurements: Height: '5\' 11"'$  (180.3 cm) Weight: 74.8 kg (165 lb) IBW/kg (Calculated) : 75.3 Heparin Dosing Weight: 74.8 kg  Vital Signs: Temp: 98.3 F (36.8 C) (08/09 0526) Temp Source: Oral (08/09 0526) BP: 165/63 (08/09 0500) Pulse Rate: 54 (08/09 0500)  Labs: Recent Labs    09/01/21 1103 09/01/21 1350 09/01/21 1446 09/01/21 1630 09/01/21 1830 09/01/21 2308 09/02/21 0620  HGB 13.7  --   --   --   --   --  13.5  HCT 41.7  --   --   --   --   --  40.1  PLT 146*  --   --   --   --   --  142*  APTT  --   --  31  --   --   --   --   LABPROT  --   --  13.6  --   --   --   --   INR  --   --  1.1  --   --   --   --   HEPARINUNFRC  --   --   --   --   --  0.50 0.57  CREATININE 1.10  --   --   --   --   --   --   TROPONINIHS 50* 115*  --  190* 184*  --   --      Estimated Creatinine Clearance: 51 mL/min (by C-G formula based on SCr of 1.1 mg/dL).   Medical History: Past Medical History:  Diagnosis Date   Actinic keratosis    Allergy    Anxiety 06/17/2014   Basal cell carcinoma 06/24/2009   Left nose sup. nasal alar crease.    Basal cell carcinoma 09/11/2012   Left lateral bicep. Focal sclerosis. Excised: 11/01/2012, margins free.   Basal cell carcinoma 02/19/2021   R preauricular - ED&C   Benign prostatic hyperplasia with urinary obstruction 06/17/2014   Benign prostatic hypertrophy without urinary obstruction 06/20/2013   Bladder calculi 04/26/2013   Calculi,  ureter 03/29/2013   Calculus of kidney 02/25/2013   Cataract    Clinical depression 06/17/2014   Degenerative disc disease, lumbar    Frank hematuria 06/28/2013   HLD (hyperlipidemia) 06/17/2014   Hx of squamous cell carcinoma    multiple sites   Spondylolysis    Squamous cell carcinoma of skin 09/11/2012   Right lateral antecubital area. SCCis.    Squamous cell carcinoma of skin 03/10/2015   Right temple hair line. SCCis   Squamous cell carcinoma of skin 07/26/2016   Right temple hair line. SCCis, hypertrophic.    Medications:  No history of chronic AC use PTA  Assessment: Pharmacy has been consulted to initiate and monitor heparin infusion in 88yo male with history of CAD s/p PCI with post-stent angioplasty in the RCA presenting to the ED with chest pain that started around 7 AM today after getting out of the shower.  Troponin levels of 50>115.   Baseline labs: aPTT 31 sec, INR 1.1, Hgb 13.7, Plts 146  Goal of Therapy:  Heparin level 0.3-0.7 units/ml Monitor platelets by anticoagulation protocol: Yes   8/08 2308 HL 0.50, therapeutic x 1 8/09 0620 HL 0.57, therapeutic x 2  Plan:  Continue heparin infusion at 900 units/hr Recheck w/ AM labs daily while therapeutic on heparin Continue to monitor H&H and platelets  Renda Rolls, PharmD, [facility] 09/02/2021 7:02 AM

## 2021-09-02 NOTE — Progress Notes (Signed)
PROGRESS NOTE    Todd Bennett   VVO:[id number] DOB: May 18, 1932  DOA: 09/01/2021 Date of Service: 09/02/21 PCP: [name], MD     Brief Narrative / Hospital Course:  Todd Bennett is a 88 y.o. male with medical history significant for coronary artery disease status PCI with post stent angioplasty in the RCA, depression, anxiety, BPH who presents to the ER 09/01/2021 via EMS for evaluation of chest pain that started at about 7 AM after getting out of the shower. 08/08: uptrending troponin level 50 >> 115, admitted for NSTEMI.  Heparin drip initiated in the ED, to continue ASA statin and beta-blocker, echocardiogram pending.  Cardiology consulted: Plan for [facility] in the morning 08/09: Chest pain has resolved.  Lexiscan had to be delayed until tomorrow.  Cardiology following. Troponin 50 on 08/08 --> 115 --> 190 --> 184 today at 18:30.  Continue heparin drip per cardiology    Consultants:  Cardiology [facility])   Procedures: none    Subjective: Patient reports doing well this morning, no chest pain or shortness of breath.  Had breakfast this morning and equipment is not available this afternoon so he is a bit disappointed that we are unable to do stress test today, but he is amenable to staying overnight and no additional questions/concerns.     ASSESSMENT & PLAN:   Principal Problem:   NSTEMI (non-ST elevated myocardial infarction) ([place]) Active Problems:   CAD (coronary artery disease)   Clinical depression   Benign prostatic hyperplasia without urinary obstruction   NSTEMI (non-ST elevated myocardial infarction) ([place]) Patient with a history of coronary artery disease status post PCI with stent angioplasty to the RCA who presents for evaluation of sudden onset chest pain with radiation to both arms associated with nausea and shortness of breath that was relieved by sublingual nitroglycerin. Patient noted to have an uptrending troponin level Obtain 2D  echocardiogram to assess LVEF and rule out regional wall motion abnormality Continue heparin drip initiated in the ER Continue aspirin, statins and metoprolol Cardiology following Stress test pending for tomorrow  Lipid panel: LDL at goal at 60 A1c: Pending  Clinical depression Stable Continue venlafaxine, Remeron and fluoxetine  CAD (coronary artery disease) Treatment as outlined in 1  Benign prostatic hyperplasia without urinary obstruction Stable Continue Flomax and finasteride    DVT prophylaxis: heparin Code Status: DNR Family Communication: none at this time, patient declines call, states he is in communication with his daughters. Disposition Plan / TOC needs: remains inpatient Barriers to discharge / significant pending items:              Objective: Vitals:   09/02/21 0838 09/02/21 1100 09/02/21 1309 09/02/21 1313  BP: (!) 158/72 (!) 177/78 136/80   Pulse: 64 (!) 58 (!) 55 72  Resp: (!) 22 12 (!) 23 20  Temp: (!) 97.4 F (36.3 C) 98.2 F (36.8 C)    TempSrc: Oral Oral    SpO2: 98% 99% 96% 98%  Weight:      Height:        Intake/Output Summary (Last 24 hours) at 09/02/2021 1334 Last data filed at 09/02/2021 0745 Gross per 24 hour  Intake 186.95 ml  Output 100 ml  Net 86.95 ml   Filed Weights   09/01/21 1059  Weight: 74.8 kg    Examination:  Constitutional:  VS as above General Appearance: alert, well-developed, well-nourished, NAD Eyes: Normal lids and conjunctive, non-icteric sclera Ears, Nose, Mouth, Throat: Normal external appearance MMM Neck:  No masses, trachea midline Respiratory: Normal respiratory effort No wheeze No rhonchi No rales Cardiovascular: S1/S2 normal No murmur No JVD No lower extremity edema Gastrointestinal: No tenderness No masses No hernia appreciated Musculoskeletal:  No clubbing/cyanosis of digits Symmetrical movement in all extremities Neurological: No cranial nerve deficit on limited  exam Alert Psychiatric: Normal judgment/insight Normal mood and affect       Scheduled Medications:   aspirin EC  81 mg Oral Daily   atorvastatin  40 mg Oral q1800   finasteride  5 mg Oral Daily   FLUoxetine  10 mg Oral q morning   FLUoxetine  20 mg Oral q morning   metoprolol tartrate  12.5 mg Oral BID   mirtazapine  7.5 mg Oral QHS   multivitamin with minerals  1 tablet Oral Q1200   omega-3 acid ethyl esters  1,000 mg Oral Q1200   pantoprazole  40 mg Oral Daily   tamsulosin  0.4 mg Oral QPM   venlafaxine XR  225 mg Oral q morning    Continuous Infusions:  heparin 900 Units/hr (09/02/21 0745)    PRN Medications:  acetaminophen, diazepam, nitroGLYCERIN, ondansetron (ZOFRAN) IV  Antimicrobials:  Anti-infectives (From admission, onward)    None       Data Reviewed: I have personally reviewed following labs and imaging studies  CBC: Recent Labs  Lab 09/01/21 1103 09/02/21 0620  WBC 6.3 6.5  HGB 13.7 13.5  HCT 41.7 40.1  MCV 89.1 90.1  PLT 146* 275*   Basic Metabolic Panel: Recent Labs  Lab 09/01/21 1103 09/02/21 0620  NA 140 138  K 4.5 4.6  CL 109 108  CO2 26 27  GLUCOSE 114* 119*  BUN 25* 21  CREATININE 1.10 0.86  CALCIUM 9.1 8.7*   GFR: Estimated Creatinine Clearance: 65.2 mL/min (by C-G formula based on SCr of 0.86 mg/dL). Liver Function Tests: No results for input(s): "AST", "ALT", "ALKPHOS", "BILITOT", "PROT", "ALBUMIN" in the last 168 hours. No results for input(s): "LIPASE", "AMYLASE" in the last 168 hours. No results for input(s): "AMMONIA" in the last 168 hours. Coagulation Profile: Recent Labs  Lab 09/01/21 1446  INR 1.1   Cardiac Enzymes: No results for input(s): "CKTOTAL", "CKMB", "CKMBINDEX", "TROPONINI" in the last 168 hours. BNP (last 3 results) No results for input(s): "PROBNP" in the last 8760 hours. HbA1C: No results for input(s): "HGBA1C" in the last 72 hours. CBG: No results for input(s): "GLUCAP" in the last 168  hours. Lipid Profile: Recent Labs    09/02/21 0620  CHOL 119  HDL 43  LDLCALC 60  TRIG 78  CHOLHDL 2.8   Thyroid Function Tests: No results for input(s): "TSH", "T4TOTAL", "FREET4", "T3FREE", "THYROIDAB" in the last 72 hours. Anemia Panel: No results for input(s): "VITAMINB12", "FOLATE", "FERRITIN", "TIBC", "IRON", "RETICCTPCT" in the last 72 hours. Urine analysis:    Component Value Date/Time   COLORURINE YELLOW 02/09/2018 2115   APPEARANCEUR CLEAR 02/09/2018 2115   APPEARANCEUR Cloudy (A) 10/13/2017 1346   LABSPEC 1.011 02/09/2018 2115   LABSPEC 1.018 02/20/2013 0949   PHURINE 5.0 02/09/2018 2115   GLUCOSEU NEGATIVE 02/09/2018 2115   GLUCOSEU see comment 02/20/2013 0949   HGBUR MODERATE (A) 02/09/2018 2115   BILIRUBINUR NEGATIVE 02/09/2018 2115   BILIRUBINUR Negative 10/13/2017 1346   BILIRUBINUR see comment 02/20/2013 [place] 02/09/2018 2115   PROTEINUR NEGATIVE 02/09/2018 2115   NITRITE NEGATIVE 02/09/2018 2115   LEUKOCYTESUR NEGATIVE 02/09/2018 2115   LEUKOCYTESUR Negative 10/13/2017 1346   LEUKOCYTESUR  see comment 02/20/2013 0949   Sepsis Labs: '@LABRCNTIP'$ (procalcitonin:4,lacticidven:4)  No results found for this or any previous visit (from the past 240 hour(s)).       Radiology Studies: DG Chest 2 View  Result Date: 09/01/2021 CLINICAL DATA:  Chest pain radiating to both arms. EXAM: CHEST - 2 VIEW COMPARISON:  02/09/2018 FINDINGS: Heart size is normal. Chronic aortic atherosclerosis. Advanced emphysema, upper lobe predominant, with bullous disease more extensive on the right than the left. No sign of active infiltrate, collapse or effusion. No pneumothorax. No pulmonary edema. IMPRESSION: Widespread and advanced emphysema, generally more severe in the right lung than the left. No acute process visible. Aortic atherosclerosis. Electronically Signed   By: Nelson Chimes M.D.   On: 09/01/2021 11:24            LOS: 1 day       [name], DO [facility] 09/02/2021, 1:34 PM   Staff may message me via secure chat in [place]  but this may not receive immediate response,  please page for urgent matters!  If 7PM-7AM, please contact night-coverage [email or web address]  Dictation software was used to generate the above note. Typos may occur and escape review, as with typed/written notes. Please contact Dr Sheppard Coil directly for clarity if needed.

## 2021-09-02 NOTE — ED Notes (Signed)
Patient resting comfortably in bed with eyes closed. NAD noted. Respirations even and unlabored.

## 2021-09-02 NOTE — Progress Notes (Signed)
[facility] NOTE       Patient ID: Todd Bennett MRN: [id number] DOB/AGE: Jul 18, 1932 88 y.o.  Admit date: 09/01/2021 Referring Physician Dr. Francine Graven Primary Physician Dr. Lovie Macadamia Primary Cardiologist Dr. Ubaldo Glassing --> Dr. Clayborn Bigness Reason for Consultation chest pain  HPI: Todd Bennett is an 68yoM with a PMH of CAD s/p DES to RCA 2019, residual 50% distal LMCA, hyperlipidemia, UC s/p colostomy 1970s hypertension, who presented to [facility] ED 09/01/21 with chest pain. Cardiology is consulted for further assistance.   Interval History:  -feels well without further chest pain since admission - says his previous episode of chest pain occurred after getting out of the shower yesterday and described it as  "outrageous" radiating to both his arms and associated with nausea and diaphoresis, fortunately relieved with one expired SL NTG -no shortness of breath, nausea -unfortunately due to time constraints in the nuc lab, stress test will be performed tomorrow morning   Review of systems complete and found to be negative unless listed above     Past Medical History:  Diagnosis Date   Actinic keratosis    Allergy    Anxiety 06/17/2014   Basal cell carcinoma 06/24/2009   Left nose sup. nasal alar crease.    Basal cell carcinoma 09/11/2012   Left lateral bicep. Focal sclerosis. Excised: 11/01/2012, margins free.   Basal cell carcinoma 02/19/2021   R preauricular - ED&C   Benign prostatic hyperplasia with urinary obstruction 06/17/2014   Benign prostatic hypertrophy without urinary obstruction 06/20/2013   Bladder calculi 04/26/2013   Calculi, ureter 03/29/2013   Calculus of kidney 02/25/2013   Cataract    Clinical depression 06/17/2014   Degenerative disc disease, lumbar    Frank hematuria 06/28/2013   HLD (hyperlipidemia) 06/17/2014   Hx of squamous cell carcinoma    multiple sites   Spondylolysis    Squamous cell carcinoma of skin 09/11/2012   Right lateral antecubital  area. SCCis.    Squamous cell carcinoma of skin 03/10/2015   Right temple hair line. SCCis   Squamous cell carcinoma of skin 07/26/2016   Right temple hair line. SCCis, hypertrophic.    Past Surgical History:  Procedure Laterality Date   CORONARY STENT INTERVENTION N/A 03/15/2017   Procedure: CORONARY STENT INTERVENTION;  Surgeon: Yolonda Kida, MD;  Location: [facility];  Service: Cardiovascular;  Laterality: N/A;   HERNIA REPAIR  1993   ILEOSTOMY     IR ANGIOGRAM EXTREMITY LEFT  02/10/2018   IR ANGIOGRAM SELECTIVE EACH ADDITIONAL VESSEL  02/10/2018   IR ANGIOGRAM SELECTIVE EACH ADDITIONAL VESSEL  02/10/2018   IR ANGIOGRAM SELECTIVE EACH ADDITIONAL VESSEL  02/10/2018   IR EMBO VENOUS NOT HEMORR HEMANG  INC GUIDE ROADMAPPING  02/10/2018   IR US GUIDE VASC ACCESS RIGHT  02/10/2018   LEFT HEART CATH AND CORONARY ANGIOGRAPHY N/A 03/15/2017   Procedure: LEFT HEART CATH AND CORONARY ANGIOGRAPHY;  Surgeon: Teodoro Spray, MD;  Location: [facility];  Service: Cardiovascular;  Laterality: N/A;   LITHOTRIPSY  03/2013    (Not in a hospital admission)  Social History   Socioeconomic History   Marital status: Married    Spouse name: Not on file   Number of children: Not on file   Years of education: Not on file   Highest education level: Not on file  Occupational History   Not on file  Tobacco Use   Smoking status: Former   Smokeless tobacco: Never  Vaping Use  Vaping Use: Never used  Substance and Sexual Activity   Alcohol use: No    Alcohol/week: 0.0 standard drinks of alcohol   Drug use: No   Sexual activity: Yes  Other Topics Concern   Not on file  Social History Narrative   Not on file   Social Determinants of Health   Financial Resource Strain: Not on file  Food Insecurity: Not on file  Transportation Needs: Not on file  Physical Activity: Not on file  Stress: Not on file  Social Connections: Not on file  Intimate Partner Violence: Not on file     Family History  Problem Relation Age of Onset   Diabetes Mother    Prostate cancer Neg Hx    Bladder Cancer Neg Hx      PHYSICAL EXAM General: very pleasant elderly caucasian male , well nourished, in no acute distress.laying flat in hospital bed.  HEENT:  Normocephalic and atraumatic. Neck:  No JVD.  Lungs: Normal respiratory effort on room air. Clear bilaterally to auscultation. No wheezes, crackles, rhonchi.  Heart: HRRR . Normal S1 and S2 without gallops or murmurs. Radial & DP pulses 2+ bilaterally. Abdomen: Non-distended appearing.  Msk: Normal strength and tone for age. Extremities: Warm and well perfused. No clubbing, cyanosis. No peripheral edema.  Neuro: Alert and oriented X 3. Psych:  Answers questions appropriately.   Labs:   Lab Results  Component Value Date   WBC 6.5 09/02/2021   HGB 13.5 09/02/2021   HCT 40.1 09/02/2021   MCV 90.1 09/02/2021   PLT 142 (L) 09/02/2021    Recent Labs  Lab 09/02/21 0620  NA 138  K 4.6  CL 108  CO2 27  BUN 21  CREATININE 0.86  CALCIUM 8.7*  GLUCOSE 119*   Lab Results  Component Value Date   TROPONINI 0.37 (HH) 03/14/2017    Lab Results  Component Value Date   CHOL 119 09/02/2021   CHOL 181 03/14/2017   Lab Results  Component Value Date   HDL 43 09/02/2021   HDL 43 03/14/2017   Lab Results  Component Value Date   LDLCALC 60 09/02/2021   LDLCALC 120 (H) 03/14/2017   Lab Results  Component Value Date   TRIG 78 09/02/2021   TRIG 90 03/14/2017   Lab Results  Component Value Date   CHOLHDL 2.8 09/02/2021   CHOLHDL 4.2 03/14/2017   No results found for: "LDLDIRECT"    Radiology: DG Chest 2 View  Result Date: 09/01/2021 CLINICAL DATA:  Chest pain radiating to both arms. EXAM: CHEST - 2 VIEW COMPARISON:  02/09/2018 FINDINGS: Heart size is normal. Chronic aortic atherosclerosis. Advanced emphysema, upper lobe predominant, with bullous disease more extensive on the right than the left. No sign of active  infiltrate, collapse or effusion. No pneumothorax. No pulmonary edema. IMPRESSION: Widespread and advanced emphysema, generally more severe in the right lung than the left. No acute process visible. Aortic atherosclerosis. Electronically Signed   By: Nelson Chimes M.D.   On: 09/01/2021 11:24    ECHO 03/14/2017 -------------------------------------------------------------------  LV EF: 60% -   65%   -------------------------------------------------------------------  Indications:      Abnormal ECG 794.31.   -------------------------------------------------------------------  History:   PMH:  Anxiety, clinical depression , Hyperlipidemia.   -------------------------------------------------------------------  Study Conclusions   - Left ventricle: Wall thickness was increased in a pattern of mild    LVH. Systolic function was normal. The estimated ejection    fraction was in the range of 60%  to 65%. Doppler parameters are    consistent with abnormal left ventricular relaxation (grade 1    diastolic dysfunction).  - Aortic valve: Valve area (Vmax): 3.37 cm^2.  - Right ventricle: The cavity size was mildly dilated.  - Atrial septum: There was increased thickness of the septum,    consistent with lipomatous hypertrophy.  - Pulmonic valve: Peak gradient (S): 11 mm Hg.   TELEMETRY reviewed by me: sinus rhythm   EKG reviewed by me: sinus rhythm with septal and lateral T wave flattening   ASSESSMENT AND PLAN:  Todd Bennett is an 37yoM with a PMH of CAD s/p DES to RCA 2019, residual 50% distal LMCA, hyperlipidemia, UC s/p colostomy 1970s hypertension, who presented to [facility] ED 09/01/21 with chest pain. Cardiology is consulted for further assistance.   #NSTEMI - s/p '325mg'$  Aspirin, continue '81mg'$  aspirin daily  - continue heparin drip for 48 hours (ending 8/10 at 1500)  - continue metoprolol tartrate 12.'5mg'$  BID  - continue atorvastatin '40mg'$  daily - PRN SL nitroglycerin for further chest discomfort   - echocardiogram complete  - plan for lexiscan myoview 8/10, further recommendation based on results   This patient's plan of care was discussed and created with Dr. Nehemiah Massed and he is in agreement.  Signed: Tristan Schroeder , PA-C 09/02/2021, 10:16 AM [facility]

## 2021-09-02 NOTE — Hospital Course (Addendum)
Todd Bennett is a 88 y.o. male with medical history significant for coronary artery disease status PCI with post stent angioplasty in the RCA, depression, anxiety, BPH who presents to the ER 09/01/2021 via EMS for evaluation of chest pain that started at about 7 AM after getting out of the shower. 08/08: uptrending troponin level 50 >> 115, admitted for NSTEMI.  Heparin drip initiated in the ED, to continue ASA statin and beta-blocker, echocardiogram pending.  Cardiology consulted: Plan for [facility] in the morning 08/09: Chest pain has resolved.  Lexiscan had to be delayed until tomorrow.  Cardiology following. Troponin 50 on 08/08 --> 115 --> 190 --> 184 today at 18:30.  Continue heparin drip per cardiology 08/10: Lexiscan no concerns / low risk, EF WNL. Cardiology recs: start on Plavix this afternoon in addition to aspirin which he will continue for at least 6 months to medically manage his NSTEMI, finish 48 hours of heparin around 15:00 today, follow-up with Dr. Clayborn Bigness in 1 to 2 weeks.

## 2021-09-03 ENCOUNTER — Telehealth (HOSPITAL_COMMUNITY): Payer: Self-pay | Admitting: Pharmacy Technician

## 2021-09-03 ENCOUNTER — Inpatient Hospital Stay: Payer: PPO

## 2021-09-03 ENCOUNTER — Other Ambulatory Visit (HOSPITAL_COMMUNITY): Payer: Self-pay

## 2021-09-03 DIAGNOSIS — I214 Non-ST elevation (NSTEMI) myocardial infarction: Secondary | ICD-10-CM | POA: Diagnosis not present

## 2021-09-03 LAB — CBC
HCT: 39.5 % (ref 39.0–52.0)
Hemoglobin: 13.3 g/dL (ref 13.0–17.0)
MCH: 29.6 pg (ref 26.0–34.0)
MCHC: 33.7 g/dL (ref 30.0–36.0)
MCV: 88 fL (ref 80.0–100.0)
Platelets: 148 10*3/uL — ABNORMAL LOW (ref 150–400)
RBC: 4.49 MIL/uL (ref 4.22–5.81)
RDW: 12.7 % (ref 11.5–15.5)
WBC: 7.5 10*3/uL (ref 4.0–10.5)
nRBC: 0 % (ref 0.0–0.2)

## 2021-09-03 LAB — BASIC METABOLIC PANEL
Anion gap: 4 — ABNORMAL LOW (ref 5–15)
BUN: 20 mg/dL (ref 8–23)
CO2: 26 mmol/L (ref 22–32)
Calcium: 9.1 mg/dL (ref 8.9–10.3)
Chloride: 109 mmol/L (ref 98–111)
Creatinine, Ser: 0.9 mg/dL (ref 0.61–1.24)
GFR, Estimated: >60 mL/min (ref >60)
Glucose, Bld: 112 mg/dL — ABNORMAL HIGH (ref 70–99)
Potassium: 4 mmol/L (ref 3.5–5.1)
Sodium: 139 mmol/L (ref 135–145)

## 2021-09-03 LAB — NM MYOCAR MULTI W/SPECT W/WALL MOTION / EF
Base ST Depression (mm): 0 mm
LV dias vol: 64 mL (ref 62–150)
LV sys vol: 27 mL
Nuc Stress EF: 58 %
Rest Nuclear Isotope Dose: 10.3 mCi
SDS: 0
SRS: 0
SSS: 0
ST Depression (mm): 0 mm
Stress Nuclear Isotope Dose: 29.1 mCi
TID: 0.85

## 2021-09-03 LAB — ECHOCARDIOGRAM COMPLETE
Area-P 1/2: 2.8 cm2
Height: 71 in
S' Lateral: 2.7 cm
Weight: 2640 oz

## 2021-09-03 LAB — HEPARIN LEVEL (UNFRACTIONATED): Heparin Unfractionated: 0.5 IU/mL (ref 0.30–0.70)

## 2021-09-03 MED ORDER — REGADENOSON 0.4 MG/5ML IV SOLN
0.4000 mg | Freq: Once | INTRAVENOUS | Status: AC
Start: 2021-09-03 — End: 2021-09-03
  Administered 2021-09-03: 0.4 mg via INTRAVENOUS

## 2021-09-03 MED ORDER — LOSARTAN POTASSIUM 25 MG PO TABS
25.0000 mg | ORAL_TABLET | Freq: Every day | ORAL | Status: DC
Start: 2021-09-03 — End: 2021-09-03
  Administered 2021-09-03: 25 mg via ORAL
  Filled 2021-09-03: qty 1

## 2021-09-03 MED ORDER — LOSARTAN POTASSIUM 25 MG PO TABS: 25.0000 mg | ORAL_TABLET | Freq: Every day | ORAL | 0 refills | Status: AC

## 2021-09-03 MED ORDER — CLOPIDOGREL BISULFATE 75 MG PO TABS
75.0000 mg | ORAL_TABLET | Freq: Every day | ORAL | Status: DC
  Administered 2021-09-03: 75 mg via ORAL
  Filled 2021-09-03: qty 1

## 2021-09-03 MED ORDER — TECHNETIUM TC 99M TETROFOSMIN IV KIT
29.1300 | PACK | Freq: Once | INTRAVENOUS | Status: AC | PRN
  Administered 2021-09-03: 29.13 via INTRAVENOUS

## 2021-09-03 MED ORDER — TECHNETIUM TC 99M TETROFOSMIN IV KIT
10.3200 | PACK | Freq: Once | INTRAVENOUS | Status: AC | PRN
  Administered 2021-09-03: 10.32 via INTRAVENOUS

## 2021-09-03 MED ORDER — METOPROLOL TARTRATE 25 MG PO TABS: 12.5000 mg | ORAL_TABLET | Freq: Two times a day (BID) | ORAL | 0 refills | Status: AC

## 2021-09-03 MED ORDER — EZETIMIBE 10 MG PO TABS: 10.0000 mg | ORAL_TABLET | Freq: Every day | ORAL | Status: DC

## 2021-09-03 MED ORDER — NITROGLYCERIN 0.4 MG SL SUBL: 0.4000 mg | SUBLINGUAL_TABLET | SUBLINGUAL | 0 refills | Status: AC | PRN

## 2021-09-03 MED ORDER — CLOPIDOGREL BISULFATE 75 MG PO TABS: 75.0000 mg | ORAL_TABLET | Freq: Every day | ORAL | 0 refills | Status: DC

## 2021-09-03 NOTE — Progress Notes (Signed)
Patient is currently NPO for stress test scheduled in the morning.  Informed consent signed. Copy placed in patient's chart.

## 2021-09-03 NOTE — Progress Notes (Addendum)
  Transition of Care [facility]) Screening Note   Patient Details  Name: Todd Bennett Date of Birth: 1932/01/30   Transition of Care [facility]) CM/SW Contact:    Alberteen Sam, LCSW Phone Number: 09/03/2021, 1:42 PM  From [place] - they report no TOC needs due to it being ILF.   Transition of Care Department [facility]) has reviewed patient and no TOC needs have been identified at this time. We will continue to monitor patient advancement through interdisciplinary progression rounds. If new patient transition needs arise, please place a TOC consult.  Todd Bennett, [place]

## 2021-09-03 NOTE — TOC Benefit Eligibility Note (Signed)
Patient Teacher, English as a foreign language completed.    The patient is currently admitted and upon discharge could be taking ezetimibe 10 mg tablets.  The current 30 day co-pay is $15.00.   The patient is insured through [place], [place] Patient Advocate Specialist [place] Patient Advocate Team Direct Number: [phone]  Fax: [phone]

## 2021-09-03 NOTE — Discharge Summary (Signed)
Physician Discharge Summary   Patient: Todd Bennett MRN: [id number]  DOB: [birth year]   Admit:     Date of Admission: 09/01/2021 Admitted from: home   Discharge: Date of discharge: 09/03/21 Disposition: Home Condition at discharge: good  CODE STATUS: DNR     Discharge Physician: [name], DO [facility]     PCP: [name], MD  Recommendations for Outpatient Follow-up:  Follow up with PCP [name], MD in 2-4 weeks Follow-up with cardiology in 1 to 2 weeks Please obtain labs/tests: CBC, CMP, lipid panel in 4-6 weeks Please follow up on the following pending results: none    Discharge Instructions     Amb Referral to Cardiac Rehabilitation   Complete by: As directed    Diagnosis: NSTEMI   After initial evaluation and assessments completed: Virtual Based Care may be provided alone or in conjunction with Phase 2 Cardiac Rehab based on patient barriers.: Yes   Diet - low sodium heart healthy   Complete by: As directed    Increase activity slowly   Complete by: As directed           Hospital Course: ODAS Bennett is a 88 y.o. male with medical history significant for coronary artery disease status PCI with post stent angioplasty in the RCA, depression, anxiety, BPH who presents to the ER 09/01/2021 via EMS for evaluation of chest pain that started at about 7 AM after getting out of the shower. 08/08: uptrending troponin level 50 >> 115, admitted for NSTEMI.  Heparin drip initiated in the ED, to continue ASA statin and beta-blocker, echocardiogram pending.  Cardiology consulted: Plan for [facility] in the morning 08/09: Chest pain has resolved.  Lexiscan had to be delayed until tomorrow.  Cardiology following. Troponin 50 on 08/08 --> 115 --> 190 --> 184 today at 18:30.  Continue heparin drip per cardiology 08/10: Lexiscan no concerns / low risk, EF WNL. Cardiology recs: start on Plavix this afternoon in addition to aspirin which he will  continue for at least 6 months to medically manage his NSTEMI, finish 48 hours of heparin around 15:00 today, follow-up with Dr. Clayborn Bigness in 1 to 2 weeks.   Consultants:  Cardiology  Procedures:  Lexiscan stress test 09/03/2021      Discharge Diagnoses: Principal Problem:   NSTEMI (non-ST elevated myocardial infarction) [facility]) Active Problems:   CAD (coronary artery disease)   Clinical depression   Benign prostatic hyperplasia without urinary obstruction    Assessment & Plan:  NSTEMI (non-ST elevated myocardial infarction) ([place]) Patient with a history of coronary artery disease status post PCI with stent angioplasty to the RCA who presents for evaluation of sudden onset chest pain with radiation to both arms associated with nausea and shortness of breath that was relieved by sublingual nitroglycerin. Patient noted to have an uptrending troponin level Obtain 2D echocardiogram to assess LVEF and rule out regional wall motion abnormality --> no concerns per cardiology, official read pending in Epic  Continue heparin drip initiated in the ER for 48 hours --> finish 09/03/21 around 15:00 Continue aspirin, statin, ACE/ARB, metoprolol Add Plavix x 6 mos (01/2022) Cardiology following --> outpatient  Stress test --> no concerns  Lipid panel: LDL above goal --> adding Zetia A1c: 5.5  Clinical depression Stable Med rec notes fluoxetine 30 mg, venlafaxine 225 mg, Remeron Discharged home on lower dose fluoxetine   CAD (coronary artery disease) Treatment as outlined in 1  Benign prostatic hyperplasia without urinary obstruction Stable  Continue Flomax and finasteride      Discharge Instructions  Allergies as of 09/03/2021       Reactions   Ciprofloxacin Shortness Of Breath, Swelling   Tongue thickened   Clarithromycin Shortness Of Breath, Swelling   Tongue thickened   Elemental Sulfur Shortness Of Breath, Swelling   Tongue thicened   Oxycodone Shortness Of Breath,  Swelling   Tongue thickened   Penicillins Shortness Of Breath, Swelling   Tongue thickened and knots on bottom of feet        Medication List     STOP taking these medications    celecoxib 200 MG capsule Commonly known as: CELEBREX   metroNIDAZOLE 0.75 % gel Commonly known as: METROGEL   metroNIDAZOLE 1 % gel Commonly known as: METROGEL   prednisoLONE acetate 1 % ophthalmic suspension Commonly known as: PRED FORTE   Prosacea Gel   venlafaxine XR 75 MG 24 hr capsule Commonly known as: EFFEXOR-XR       TAKE these medications    aspirin EC 81 MG tablet Take 81 mg by mouth every morning.   atorvastatin 40 MG tablet Commonly known as: LIPITOR Take 1 tablet (40 mg total) by mouth daily at 6 PM. What changed: Another medication with the same name was removed. Continue taking this medication, and follow the directions you see here.   Cholecalciferol 25 MCG (1000 UT) tablet Take 1,000 Units by mouth every morning.   clopidogrel 75 MG tablet Commonly known as: PLAVIX Take 1 tablet (75 mg total) by mouth daily. Note: plan continue Plavix + Aspirin for 6 months (through 01/2022) Start taking on: September 04, 2021   diazepam 5 MG tablet Commonly known as: VALIUM Take 2.5 mg by mouth daily as needed for anxiety.   finasteride 5 MG tablet Commonly known as: PROSCAR TAKE ONE TABLET EVERY DAY   Fish Oil 1000 MG Caps Take 1,000 mg by mouth daily.   FLUoxetine 20 MG capsule Commonly known as: PROZAC Take 20 mg by mouth every morning. Take with '10mg'$  capsule to equal '30mg'$  What changed: Another medication with the same name was removed. Continue taking this medication, and follow the directions you see here.   losartan 25 MG tablet Commonly known as: COZAAR Take 1 tablet (25 mg total) by mouth daily. Start taking on: September 04, 2021   metoprolol tartrate 25 MG tablet Commonly known as: LOPRESSOR Take 0.5 tablets (12.5 mg total) by mouth 2 (two) times daily.    mirtazapine 7.5 MG tablet Commonly known as: REMERON Take 7.5 mg by mouth at bedtime.   Multi-Vitamins Tabs Take 1 tablet by mouth every morning.   nitroGLYCERIN 0.4 MG SL tablet Commonly known as: NITROSTAT Place 1 tablet (0.4 mg total) under the tongue every 5 (five) minutes x 3 doses as needed for chest pain.   omeprazole 20 MG capsule Commonly known as: PRILOSEC Take 20 mg by mouth daily.   tamsulosin 0.4 MG Caps capsule Commonly known as: FLOMAX Take 0.4 mg by mouth every evening.   vitamin C 1000 MG tablet Take 1,000 mg by mouth every morning.         Follow-up Information     Yolonda Kida, MD. Go in 1 week(s).   Specialties: Cardiology, Internal Medicine Contact information: [place] [address] [phone]                 Allergies  Allergen Reactions   Ciprofloxacin Shortness Of Breath and Swelling  Tongue thickened   Clarithromycin Shortness Of Breath and Swelling    Tongue thickened   Elemental Sulfur Shortness Of Breath and Swelling    Tongue thicened   Oxycodone Shortness Of Breath and Swelling    Tongue thickened   Penicillins Shortness Of Breath and Swelling    Tongue thickened and knots on bottom of feet     Subjective: Patient feeling well this morning, no chest pain/shortness of breath.   Discharge Exam: BP (!) 152/59 (BP Location: Left Arm)   Pulse 61   Temp 97.9 F (36.6 C)   Resp 19   Ht '5\' 11"'$  (1.803 m)   Wt 74.8 kg   SpO2 100%   BMI 23.01 kg/m  General: Pt is alert, awake, not in acute distress Cardiovascular: RRR, S1/S2 +, no rubs, no gallops Respiratory: CTA bilaterally, no wheezing, no rhonchi Abdominal: Soft, NT, ND, bowel sounds + Extremities: no edema, no cyanosis     The results of significant diagnostics from this hospitalization (including imaging, microbiology, ancillary and laboratory) are listed below for reference.     Microbiology: No results found for this or  any previous visit (from the past 240 hour(s)).   Labs: BNP (last 3 results) No results for input(s): "BNP" in the last 8760 hours. Basic Metabolic Panel: Recent Labs  Lab 09/01/21 1103 09/02/21 0620 09/03/21 0543  NA 140 138 139  K 4.5 4.6 4.0  CL 109 108 109  CO2 '26 27 26  '$ GLUCOSE 114* 119* 112*  BUN 25* 21 20  CREATININE 1.10 0.86 0.90  CALCIUM 9.1 8.7* 9.1   Liver Function Tests: No results for input(s): "AST", "ALT", "ALKPHOS", "BILITOT", "PROT", "ALBUMIN" in the last 168 hours. No results for input(s): "LIPASE", "AMYLASE" in the last 168 hours. No results for input(s): "AMMONIA" in the last 168 hours. CBC: Recent Labs  Lab 09/01/21 1103 09/02/21 0620 09/03/21 0543  WBC 6.3 6.5 7.5  HGB 13.7 13.5 13.3  HCT 41.7 40.1 39.5  MCV 89.1 90.1 88.0  PLT 146* 142* 148*   Cardiac Enzymes: No results for input(s): "CKTOTAL", "CKMB", "CKMBINDEX", "TROPONINI" in the last 168 hours. BNP: Invalid input(s): "POCBNP" CBG: No results for input(s): "GLUCAP" in the last 168 hours. D-Dimer No results for input(s): "DDIMER" in the last 72 hours. Hgb A1c Recent Labs    09/02/21 0620  HGBA1C 5.5   Lipid Profile Recent Labs    09/02/21 0620  CHOL 119  HDL 43  LDLCALC 60  TRIG 78  CHOLHDL 2.8   Thyroid function studies No results for input(s): "TSH", "T4TOTAL", "T3FREE", "THYROIDAB" in the last 72 hours.  Invalid input(s): "FREET3" Anemia work up No results for input(s): "VITAMINB12", "FOLATE", "FERRITIN", "TIBC", "IRON", "RETICCTPCT" in the last 72 hours. Urinalysis    Component Value Date/Time   COLORURINE YELLOW 02/09/2018 2115   APPEARANCEUR CLEAR 02/09/2018 2115   APPEARANCEUR Cloudy (A) 10/13/2017 1346   LABSPEC 1.011 02/09/2018 2115   LABSPEC 1.018 02/20/2013 0949   PHURINE 5.0 02/09/2018 2115   GLUCOSEU NEGATIVE 02/09/2018 2115   GLUCOSEU see comment 02/20/2013 0949   HGBUR MODERATE (A) 02/09/2018 2115   BILIRUBINUR NEGATIVE 02/09/2018 2115    BILIRUBINUR Negative 10/13/2017 1346   BILIRUBINUR see comment 02/20/2013 [place] 02/09/2018 2115   PROTEINUR NEGATIVE 02/09/2018 2115   NITRITE NEGATIVE 02/09/2018 2115   LEUKOCYTESUR NEGATIVE 02/09/2018 2115   LEUKOCYTESUR Negative 10/13/2017 1346   LEUKOCYTESUR see comment 02/20/2013 0949   Sepsis Labs Recent Labs  Lab  09/01/21 1103 09/02/21 0620 09/03/21 0543  WBC 6.3 6.5 7.5   Microbiology No results found for this or any previous visit (from the past 240 hour(s)). Imaging NM Myocar Multi W/Spect W/Wall Motion / EF  Result Date: 09/03/2021   The study is normal. The study is low risk.   No ST deviation was noted.   LV perfusion is normal. There is no evidence of ischemia. There is no evidence of infarction.   Left ventricular function is normal. End diastolic cavity size is normal.   Prior study not available for comparison.      Time coordinating discharge: Over 30 minutes  SIGNED:  [name] DO [facility]

## 2021-09-03 NOTE — Progress Notes (Addendum)
Todd Bennett NOTE       Patient ID: Todd Bennett MRN: [id number] DOB/AGE: 01-Nov-1932 88 y.o.  Admit date: 09/01/2021 Referring Physician Dr. Francine Graven Primary Physician Dr. Lovie Macadamia Primary Cardiologist Dr. Ubaldo Glassing --> Dr. Clayborn Bigness Reason for Consultation chest pain  HPI: Todd Bennett is an 88yoM with a PMH of CAD s/p DES to RCA 2019, residual 50% distal LMCA, hyperlipidemia, UC s/p colostomy 1970s hypertension, who presented to [facility] ED 09/01/21 with chest pain with a troponin peak at 190, and some lateral T wave flattening on EKG concerning for NSTEMI.  Cardiology is consulted for further assistance.   Interval History:  -no acute events -no further chest pain or other complaints -remains hypertensive with SBP mostly 150-160 -Lexiscan Myoview performed this morning, read by Dr. Nehemiah Massed as a normal, low risk study  Review of systems complete and found to be negative unless listed above     Past Medical History:  Diagnosis Date   Actinic keratosis    Allergy    Anxiety 06/17/2014   Basal cell carcinoma 06/24/2009   Left nose sup. nasal alar crease.    Basal cell carcinoma 09/11/2012   Left lateral bicep. Focal sclerosis. Excised: 11/01/2012, margins free.   Basal cell carcinoma 02/19/2021   R preauricular - ED&C   Benign prostatic hyperplasia with urinary obstruction 06/17/2014   Benign prostatic hypertrophy without urinary obstruction 06/20/2013   Bladder calculi 04/26/2013   Calculi, ureter 03/29/2013   Calculus of kidney 02/25/2013   Cataract    Clinical depression 06/17/2014   Degenerative disc disease, lumbar    Frank hematuria 06/28/2013   HLD (hyperlipidemia) 06/17/2014   Hx of squamous cell carcinoma    multiple sites   Spondylolysis    Squamous cell carcinoma of skin 09/11/2012   Right lateral antecubital area. SCCis.    Squamous cell carcinoma of skin 03/10/2015   Right temple hair line. SCCis   Squamous cell carcinoma of skin 07/26/2016    Right temple hair line. SCCis, hypertrophic.    Past Surgical History:  Procedure Laterality Date   CORONARY STENT INTERVENTION N/A 03/15/2017   Procedure: CORONARY STENT INTERVENTION;  Surgeon: Yolonda Kida, MD;  Location: [facility];  Service: Cardiovascular;  Laterality: N/A;   HERNIA REPAIR  1993   ILEOSTOMY     IR ANGIOGRAM EXTREMITY LEFT  02/10/2018   IR ANGIOGRAM SELECTIVE EACH ADDITIONAL VESSEL  02/10/2018   IR ANGIOGRAM SELECTIVE EACH ADDITIONAL VESSEL  02/10/2018   IR ANGIOGRAM SELECTIVE EACH ADDITIONAL VESSEL  02/10/2018   IR EMBO VENOUS NOT HEMORR HEMANG  INC GUIDE ROADMAPPING  02/10/2018   IR US GUIDE VASC ACCESS RIGHT  02/10/2018   LEFT HEART CATH AND CORONARY ANGIOGRAPHY N/A 03/15/2017   Procedure: LEFT HEART CATH AND CORONARY ANGIOGRAPHY;  Surgeon: Teodoro Spray, MD;  Location: [facility];  Service: Cardiovascular;  Laterality: N/A;   LITHOTRIPSY  03/2013    Medications Prior to Admission  Medication Sig Dispense Refill Last Dose   Ascorbic Acid (VITAMIN C) 1000 MG tablet Take 1,000 mg by mouth every morning.    08/31/2021 at [facility]   aspirin EC 81 MG tablet Take 81 mg by mouth every morning.    09/01/2021 at AM   atorvastatin (LIPITOR) 40 MG tablet Take 1 tablet (40 mg total) by mouth daily at 6 PM. 30 tablet 0 08/31/2021 at [facility]   celecoxib (CELEBREX) 200 MG capsule TAKE 1 CAPSULE BY MOUTH TWICE DAILY 60 capsule 2  09/01/2021 at AM   Cholecalciferol 25 MCG (1000 UT) tablet Take 1,000 Units by mouth every morning.    08/31/2021 at [facility]   finasteride (PROSCAR) 5 MG tablet TAKE ONE TABLET EVERY DAY 90 tablet 2 08/31/2021   FLUoxetine (PROZAC) 20 MG capsule Take 20 mg by mouth every morning. Take with '10mg'$  capsule to equal '30mg'$    08/31/2021   mirtazapine (REMERON) 7.5 MG tablet Take 7.5 mg by mouth at bedtime.   08/31/2021 at EVENING   Multiple Vitamin (MULTI-VITAMINS) TABS Take 1 tablet by mouth every morning.    08/31/2021 at [place] (FISH OIL) 1000 MG  CAPS Take 1,000 mg by mouth daily.    08/31/2021 at [facility]   omeprazole (PRILOSEC) 20 MG capsule Take 20 mg by mouth daily.   09/01/2021 at AM   tamsulosin (FLOMAX) 0.4 MG CAPS capsule Take 0.4 mg by mouth every evening.    08/31/2021 at [facility]   venlafaxine XR (EFFEXOR-XR) 75 MG 24 hr capsule Take 225 mg by mouth every morning.   09/01/2021 at AM   atorvastatin (LIPITOR) 40 MG tablet Take 1 tablet by mouth daily.      diazepam (VALIUM) 5 MG tablet Take 2.5 mg by mouth daily as needed for anxiety.  (Patient not taking: Reported on 09/01/2021)   Not Taking   FLUoxetine (PROZAC) 10 MG capsule Take 10 mg by mouth every morning. Take with '20mg'$  capsule to equal '30mg'$  (Patient not taking: Reported on 09/01/2021)   Not Taking   Homeopathic Products (PROSACEA) GEL Apply 1 application topically every morning. (Patient not taking: Reported on 09/01/2021)   Not Taking   metoprolol tartrate (LOPRESSOR) 25 MG tablet Take 0.5 tablets (12.5 mg total) by mouth 2 (two) times daily. (Patient not taking: Reported on 09/01/2021) 30 tablet 0 Not Taking   metroNIDAZOLE (METROGEL) 0.75 % gel Apply to face twice a day (Patient not taking: Reported on 09/01/2021) 45 g 11 Not Taking   metroNIDAZOLE (METROGEL) 1 % gel Apply 1 application topically every morning.  (Patient not taking: Reported on 09/01/2021)   Not Taking   prednisoLONE acetate (PRED FORTE) 1 % ophthalmic suspension Place 1 drop into the right eye daily. (Patient not taking: Reported on 09/01/2021)   Not Taking    Social History   Socioeconomic History   Marital status: Married    Spouse name: Not on file   Number of children: Not on file   Years of education: Not on file   Highest education level: Not on file  Occupational History   Not on file  Tobacco Use   Smoking status: Former   Smokeless tobacco: Never  Vaping Use   Vaping Use: Never used  Substance and Sexual Activity   Alcohol use: No    Alcohol/week: 0.0 standard drinks of alcohol   Drug use: No   Sexual  activity: Yes  Other Topics Concern   Not on file  Social History Narrative   Not on file   Social Determinants of Health   Financial Resource Strain: Not on file  Food Insecurity: Not on file  Transportation Needs: Not on file  Physical Activity: Not on file  Stress: Not on file  Social Connections: Not on file  Intimate Partner Violence: Not on file    Family History  Problem Relation Age of Onset   Diabetes Mother    Prostate cancer Neg Hx    Bladder Cancer Neg Hx      PHYSICAL EXAM  General: very pleasant elderly caucasian male , well nourished, in no acute distress. laying flat in hospital bed.  HEENT:  Normocephalic and atraumatic. Neck:  No JVD.  Lungs: Normal respiratory effort on room air. Clear bilaterally to auscultation. No wheezes, crackles, rhonchi.  Heart: HRRR . Normal S1 and S2 without gallops or murmurs. Radial & DP pulses 2+ bilaterally. Abdomen: Non-distended appearing.  Msk: Normal strength and tone for age. Extremities: Warm and well perfused. No clubbing, cyanosis. No peripheral edema.  Neuro: Alert and oriented X 3. Psych:  Answers questions appropriately.   Labs:   Lab Results  Component Value Date   WBC 7.5 09/03/2021   HGB 13.3 09/03/2021   HCT 39.5 09/03/2021   MCV 88.0 09/03/2021   PLT 148 (L) 09/03/2021    Recent Labs  Lab 09/03/21 0543  NA 139  K 4.0  CL 109  CO2 26  BUN 20  CREATININE 0.90  CALCIUM 9.1  GLUCOSE 112*    Lab Results  Component Value Date   TROPONINI 0.37 (HH) 03/14/2017     Lab Results  Component Value Date   CHOL 119 09/02/2021   CHOL 181 03/14/2017   Lab Results  Component Value Date   HDL 43 09/02/2021   HDL 43 03/14/2017   Lab Results  Component Value Date   LDLCALC 60 09/02/2021   LDLCALC 120 (H) 03/14/2017   Lab Results  Component Value Date   TRIG 78 09/02/2021   TRIG 90 03/14/2017   Lab Results  Component Value Date   CHOLHDL 2.8 09/02/2021   CHOLHDL 4.2 03/14/2017   No  results found for: "LDLDIRECT"    Radiology: DG Chest 2 View  Result Date: 09/01/2021 CLINICAL DATA:  Chest pain radiating to both arms. EXAM: CHEST - 2 VIEW COMPARISON:  02/09/2018 FINDINGS: Heart size is normal. Chronic aortic atherosclerosis. Advanced emphysema, upper lobe predominant, with bullous disease more extensive on the right than the left. No sign of active infiltrate, collapse or effusion. No pneumothorax. No pulmonary edema. IMPRESSION: Widespread and advanced emphysema, generally more severe in the right lung than the left. No acute process visible. Aortic atherosclerosis. Electronically Signed   By: Nelson Chimes M.D.   On: 09/01/2021 11:24    ECHO 03/14/2017 -------------------------------------------------------------------  LV EF: 60% -   65%   -------------------------------------------------------------------  Indications:      Abnormal ECG 794.31.   -------------------------------------------------------------------  History:   PMH:  Anxiety, clinical depression , Hyperlipidemia.   -------------------------------------------------------------------  Study Conclusions   - Left ventricle: Wall thickness was increased in a pattern of mild    LVH. Systolic function was normal. The estimated ejection    fraction was in the range of 60% to 65%. Doppler parameters are    consistent with abnormal left ventricular relaxation (grade 1    diastolic dysfunction).  - Aortic valve: Valve area (Vmax): 3.37 cm^2.  - Right ventricle: The cavity size was mildly dilated.  - Atrial septum: There was increased thickness of the septum,    consistent with lipomatous hypertrophy.  - Pulmonic valve: Peak gradient (S): 11 mm Hg.   TELEMETRY reviewed by me: sinus bradycardia rate 50s.  EKG reviewed by me: sinus rhythm with septal and lateral T wave flattening   ASSESSMENT AND PLAN:  Todd Bennett is an 54yoM with a PMH of CAD s/p DES to RCA 2019, residual 50% distal LMCA, hyperlipidemia, UC  s/p colostomy 1970s hypertension, who presented to [facility] ED 09/01/21 with chest pain with a  troponin peak at 190, and some lateral T wave flattening on EKG concerning for NSTEMI.  Cardiology is consulted for further assistance.   #NSTEMI - s/p '325mg'$  Aspirin, continue '81mg'$  aspirin daily indefinitely - Add clopidogrel 75 mg once daily to continue for at least 6 months - continue heparin drip for 48 hours (ending 8/10 at 1500)  - continue metoprolol tartrate 12.'5mg'$  BID  - add low dose losartan '25mg'$  daily for better BP control  - continue atorvastatin '40mg'$  daily - PRN SL nitroglycerin for further chest discomfort  - echocardiogram complete performed 8/9, pending read - lexiscan myoview today which resulted as a normal, low risk study - No further cardiac diagnostics necessary, referral to cardiac rehab at discharge.  He is okay for discharge later this afternoon from a cardiac standpoint after completion of 48 hours of heparin at 1500 and if he ambulates without further chest discomfort. - Follow-up with Dr. Clayborn Bigness in 1 to 2 weeks.  This patient's plan of care was discussed and created with Dr. Nehemiah Massed and he is in agreement.  Signed: Tristan Schroeder , PA-C 09/03/2021, 9:30 AM [facility]  The patient has had no evidence of significant new onset of symptoms today.  No chest pain or other concerns.  Stress test showed normal myocardial perfusion and echocardiogram shows normal LV systolic function with ejection fraction 55% and no evidence of significant valvular heart disease.  I have personally reviewed both of these assessments.  Telemetry has shown normal sinus rhythm.  We will continue ambulation today and further treatment options as listed above.  The patient has been interviewed and examined. I agree with assessment and plan above. Serafina Royals MD [facility]

## 2021-09-03 NOTE — Telephone Encounter (Signed)
Pharmacy Patient Advocate Encounter  Insurance verification completed.    The patient is insured through [employer] Part [employer]   The patient is currently admitted and ran test claims for the following: ezetimibe.  Copays and coinsurance results were relayed to Inpatient clinical team.

## 2021-09-03 NOTE — Consult Note (Signed)
ANTICOAGULATION CONSULT NOTE   Pharmacy Consult for Heparin Indication: chest pain/ACS  Allergies  Allergen Reactions   Ciprofloxacin Shortness Of Breath and Swelling    Tongue thickened   Clarithromycin Shortness Of Breath and Swelling    Tongue thickened   Elemental Sulfur Shortness Of Breath and Swelling    Tongue thicened   Oxycodone Shortness Of Breath and Swelling    Tongue thickened   Penicillins Shortness Of Breath and Swelling    Tongue thickened and knots on bottom of feet    Patient Measurements: Height: '5\' 11"'$  (180.3 cm) Weight: 74.8 kg (165 lb) IBW/kg (Calculated) : 75.3 Heparin Dosing Weight: 74.8 kg  Vital Signs: Temp: 97.6 F (36.4 C) (08/10 0348) Temp Source: Oral (08/10 0348) BP: 136/70 (08/10 0348) Pulse Rate: 52 (08/10 0348)  Labs: Recent Labs    09/01/21 1103 09/01/21 1350 09/01/21 1446 09/01/21 1630 09/01/21 1830 09/01/21 2308 09/02/21 0620 09/03/21 0543  HGB 13.7  --   --   --   --   --  13.5 13.3  HCT 41.7  --   --   --   --   --  40.1 39.5  PLT 146*  --   --   --   --   --  142* 148*  APTT  --   --  31  --   --   --   --   --   LABPROT  --   --  13.6  --   --   --   --   --   INR  --   --  1.1  --   --   --   --   --   HEPARINUNFRC  --   --   --   --   --  0.50 0.57 0.50  CREATININE 1.10  --   --   --   --   --  0.86  --   TROPONINIHS 50* 115*  --  190* 184*  --   --   --      Estimated Creatinine Clearance: 65.2 mL/min (by C-G formula based on SCr of 0.86 mg/dL).   Medical History: Past Medical History:  Diagnosis Date   Actinic keratosis    Allergy    Anxiety 06/17/2014   Basal cell carcinoma 06/24/2009   Left nose sup. nasal alar crease.    Basal cell carcinoma 09/11/2012   Left lateral bicep. Focal sclerosis. Excised: 11/01/2012, margins free.   Basal cell carcinoma 02/19/2021   R preauricular - ED&C   Benign prostatic hyperplasia with urinary obstruction 06/17/2014   Benign prostatic hypertrophy without urinary  obstruction 06/20/2013   Bladder calculi 04/26/2013   Calculi, ureter 03/29/2013   Calculus of kidney 02/25/2013   Cataract    Clinical depression 06/17/2014   Degenerative disc disease, lumbar    Frank hematuria 06/28/2013   HLD (hyperlipidemia) 06/17/2014   Hx of squamous cell carcinoma    multiple sites   Spondylolysis    Squamous cell carcinoma of skin 09/11/2012   Right lateral antecubital area. SCCis.    Squamous cell carcinoma of skin 03/10/2015   Right temple hair line. SCCis   Squamous cell carcinoma of skin 07/26/2016   Right temple hair line. SCCis, hypertrophic.    Medications:  No history of chronic AC use PTA  Assessment: Pharmacy has been consulted to initiate and monitor heparin infusion in 88yo male with history of CAD s/p PCI with post-stent angioplasty in the RCA  presenting to the ED with chest pain that started around 7 AM today after getting out of the shower. Troponin levels of 50>115.   Baseline labs: aPTT 31 sec, INR 1.1, Hgb 13.7, Plts 146  Goal of Therapy:  Heparin level 0.3-0.7 units/ml Monitor platelets by anticoagulation protocol: Yes   8/08 2308 HL 0.50, therapeutic x 1 8/09 0620 HL 0.57, therapeutic x 2 8/10 0543 HL 0.50, therapeutic x 3  Plan:  Continue heparin infusion at 900 units/hr Recheck w/ AM labs daily while therapeutic on heparin Continue to monitor H&H and platelets  Renda Rolls, PharmD, [facility] 09/03/2021 6:45 AM

## 2021-09-15 ENCOUNTER — Ambulatory Visit: Payer: PPO | Admitting: Nurse Practitioner

## 2021-09-15 VITALS — BP 110/58 | HR 52 | Resp 20

## 2021-09-15 DIAGNOSIS — H9113 Presbycusis, bilateral: Secondary | ICD-10-CM | POA: Diagnosis not present

## 2021-09-15 DIAGNOSIS — R0981 Nasal congestion: Secondary | ICD-10-CM | POA: Diagnosis not present

## 2021-09-15 NOTE — Patient Instructions (Signed)
To use debrox once weekly to help keep ears clean  For nasal congestion To use nasal saline wash/rinse daily to clean on sinuses Avoid blowing nose If needed can use flonase 1 spray in both nares twice daily (use after rinse) -

## 2021-09-15 NOTE — Progress Notes (Signed)
Careteam: Patient Care Team: [name], MD as PCP - General (Family Medicine)  Advanced Directive information    Allergies  Allergen Reactions  . Ciprofloxacin Shortness Of Breath and Swelling    Tongue thickened  . Clarithromycin Shortness Of Breath and Swelling    Tongue thickened  . Elemental Sulfur Shortness Of Breath and Swelling    Tongue thicened  . Oxycodone Shortness Of Breath and Swelling    Tongue thickened  . Penicillins Shortness Of Breath and Swelling    Tongue thickened and knots on bottom of feet    Chief Complaint  Patient presents with  . Acute Visit    Hearing loss in left ear- possible wax     HPI: Patient is a 88 y.o. male seen in today at the [facility] for ongoing impaction of ear wax.  Reports yesterday evening could not hear anything out of his left ear and right ear was minimal.  No pain or fullness.  He uses debrox once a month   Just had nurse clean out his ears a few weeks ago.   Reports nasal congestion, runny nose. Blowing his nose frequently.   Review of Systems:  ROS***  Past Medical History:  Diagnosis Date  . Actinic keratosis   . Allergy   . Anxiety 06/17/2014  . Basal cell carcinoma 06/24/2009   Left nose sup. nasal alar crease.   . Basal cell carcinoma 09/11/2012   Left lateral bicep. Focal sclerosis. Excised: 11/01/2012, margins free.  . Basal cell carcinoma 02/19/2021   R preauricular - ED&C  . Benign prostatic hyperplasia with urinary obstruction 06/17/2014  . Benign prostatic hypertrophy without urinary obstruction 06/20/2013  . Bladder calculi 04/26/2013  . Calculi, ureter 03/29/2013  . Calculus of kidney 02/25/2013  . Cataract   . Clinical depression 06/17/2014  . Degenerative disc disease, lumbar   . Frank hematuria 06/28/2013  . HLD (hyperlipidemia) 06/17/2014  . Hx of squamous cell carcinoma    multiple sites  . Spondylolysis   . Squamous cell carcinoma of skin 09/11/2012   Right lateral  antecubital area. SCCis.   . Squamous cell carcinoma of skin 03/10/2015   Right temple hair line. SCCis  . Squamous cell carcinoma of skin 07/26/2016   Right temple hair line. SCCis, hypertrophic.   Past Surgical History:  Procedure Laterality Date  . CORONARY STENT INTERVENTION N/A 03/15/2017   Procedure: CORONARY STENT INTERVENTION;  Surgeon: Yolonda Kida, MD;  Location: [facility];  Service: Cardiovascular;  Laterality: N/A;  . HERNIA REPAIR  1993  . ILEOSTOMY    . IR ANGIOGRAM EXTREMITY LEFT  02/10/2018  . IR ANGIOGRAM SELECTIVE EACH ADDITIONAL VESSEL  02/10/2018  . IR ANGIOGRAM SELECTIVE EACH ADDITIONAL VESSEL  02/10/2018  . IR ANGIOGRAM SELECTIVE EACH ADDITIONAL VESSEL  02/10/2018  . IR EMBO VENOUS NOT HEMORR HEMANG  INC GUIDE ROADMAPPING  02/10/2018  . IR US GUIDE VASC ACCESS RIGHT  02/10/2018  . LEFT HEART CATH AND CORONARY ANGIOGRAPHY N/A 03/15/2017   Procedure: LEFT HEART CATH AND CORONARY ANGIOGRAPHY;  Surgeon: Teodoro Spray, MD;  Location: [facility];  Service: Cardiovascular;  Laterality: N/A;  . LITHOTRIPSY  03/2013   Social History:   reports that he has quit smoking. He has never used smokeless tobacco. He reports that he does not drink alcohol and does not use drugs.  Family History  Problem Relation Age of Onset  . Diabetes Mother   . Prostate cancer Neg  Hx   . Bladder Cancer Neg Hx     Medications: Patient's Medications  New Prescriptions   No medications on file  Previous Medications   ASCORBIC ACID (VITAMIN C) 1000 MG TABLET    Take 1,000 mg by mouth every morning.    ASPIRIN EC 81 MG TABLET    Take 81 mg by mouth every morning.    ATORVASTATIN (LIPITOR) 40 MG TABLET    Take 1 tablet (40 mg total) by mouth daily at 6 PM.   CHOLECALCIFEROL 25 MCG (1000 UT) TABLET    Take 1,000 Units by mouth every morning.    CLOPIDOGREL (PLAVIX) 75 MG TABLET    Take 1 tablet (75 mg total) by mouth daily. Note: plan continue Plavix + Aspirin for 6 months  (through 01/2022)   DIAZEPAM (VALIUM) 5 MG TABLET    Take 2.5 mg by mouth daily as needed for anxiety.    FINASTERIDE (PROSCAR) 5 MG TABLET    TAKE ONE TABLET EVERY DAY   FLUOXETINE (PROZAC) 20 MG CAPSULE    Take 20 mg by mouth every morning. Take with '10mg'$  capsule to equal '30mg'$    LOSARTAN (COZAAR) 25 MG TABLET    Take 1 tablet (25 mg total) by mouth daily.   METOPROLOL TARTRATE (LOPRESSOR) 25 MG TABLET    Take 0.5 tablets (12.5 mg total) by mouth 2 (two) times daily.   MIRTAZAPINE (REMERON) 7.5 MG TABLET    Take 7.5 mg by mouth at bedtime.   MULTIPLE VITAMIN (MULTI-VITAMINS) TABS    Take 1 tablet by mouth every morning.    NITROGLYCERIN (NITROSTAT) 0.4 MG SL TABLET    Place 1 tablet (0.4 mg total) under the tongue every 5 (five) minutes x 3 doses as needed for chest pain.   OMEGA-3 FATTY ACIDS (FISH OIL) 1000 MG CAPS    Take 1,000 mg by mouth daily.    OMEPRAZOLE (PRILOSEC) 20 MG CAPSULE    Take 20 mg by mouth daily.   TAMSULOSIN (FLOMAX) 0.4 MG CAPS CAPSULE    Take 0.4 mg by mouth every evening.   Modified Medications   No medications on file  Discontinued Medications   No medications on file    Physical Exam:  Vitals:   09/15/21 1036  BP: (!) 110/58  Pulse: (!) 52  Resp: 20  SpO2: 94%   There is no height or weight on file to calculate BMI. Wt Readings from Last 3 Encounters:  09/01/21 165 lb (74.8 kg)  05/07/21 165 lb (74.8 kg)  02/09/18 166 lb (75.3 kg)    Physical Exam***  Labs reviewed: Basic Metabolic Panel: Recent Labs    09/01/21 1103 09/02/21 0620 09/03/21 0543  NA 140 138 139  K 4.5 4.6 4.0  CL 109 108 109  CO2 '26 27 26  '$ GLUCOSE 114* 119* 112*  BUN 25* 21 20  CREATININE 1.10 0.86 0.90  CALCIUM 9.1 8.7* 9.1   Liver Function Tests: No results for input(s): "AST", "ALT", "ALKPHOS", "BILITOT", "PROT", "ALBUMIN" in the last 8760 hours. No results for input(s): "LIPASE", "AMYLASE" in the last 8760 hours. No results for input(s): "AMMONIA" in the last 8760  hours. CBC: Recent Labs    09/01/21 1103 09/02/21 0620 09/03/21 0543  WBC 6.3 6.5 7.5  HGB 13.7 13.5 13.3  HCT 41.7 40.1 39.5  MCV 89.1 90.1 88.0  PLT 146* 142* 148*   Lipid Panel: Recent Labs    09/02/21 0620  CHOL 119  HDL 43  LDLCALC  60  TRIG 78  CHOLHDL 2.8   TSH: No results for input(s): "TSH" in the last 8760 hours. A1C: Lab Results  Component Value Date   HGBA1C 5.5 09/02/2021     Assessment/Plan There are no diagnoses linked to this encounter.  Next appt: *** Ernestene Coover K. [place], [facility] [phone]

## 2021-09-16 DIAGNOSIS — I214 Non-ST elevation (NSTEMI) myocardial infarction: Secondary | ICD-10-CM | POA: Diagnosis not present

## 2021-09-16 DIAGNOSIS — Z955 Presence of coronary angioplasty implant and graft: Secondary | ICD-10-CM | POA: Diagnosis not present

## 2021-09-16 DIAGNOSIS — E782 Mixed hyperlipidemia: Secondary | ICD-10-CM | POA: Diagnosis not present

## 2021-09-16 DIAGNOSIS — I2510 Atherosclerotic heart disease of native coronary artery without angina pectoris: Secondary | ICD-10-CM | POA: Diagnosis not present

## 2021-09-16 DIAGNOSIS — I10 Essential (primary) hypertension: Secondary | ICD-10-CM | POA: Diagnosis not present

## 2021-09-21 DIAGNOSIS — K219 Gastro-esophageal reflux disease without esophagitis: Secondary | ICD-10-CM | POA: Diagnosis not present

## 2021-09-21 DIAGNOSIS — F32A Depression, unspecified: Secondary | ICD-10-CM | POA: Diagnosis not present

## 2021-09-21 DIAGNOSIS — M5441 Lumbago with sciatica, right side: Secondary | ICD-10-CM | POA: Diagnosis not present

## 2021-09-21 DIAGNOSIS — I2510 Atherosclerotic heart disease of native coronary artery without angina pectoris: Secondary | ICD-10-CM | POA: Diagnosis not present

## 2021-09-21 DIAGNOSIS — M5442 Lumbago with sciatica, left side: Secondary | ICD-10-CM | POA: Diagnosis not present

## 2021-09-21 DIAGNOSIS — E785 Hyperlipidemia, unspecified: Secondary | ICD-10-CM | POA: Diagnosis not present

## 2021-09-21 DIAGNOSIS — L719 Rosacea, unspecified: Secondary | ICD-10-CM | POA: Diagnosis not present

## 2021-09-21 DIAGNOSIS — N401 Enlarged prostate with lower urinary tract symptoms: Secondary | ICD-10-CM | POA: Diagnosis not present

## 2021-09-21 DIAGNOSIS — Z0000 Encounter for general adult medical examination without abnormal findings: Secondary | ICD-10-CM | POA: Diagnosis not present

## 2021-10-01 ENCOUNTER — Other Ambulatory Visit (HOSPITAL_BASED_OUTPATIENT_CLINIC_OR_DEPARTMENT_OTHER): Payer: Self-pay | Admitting: Osteopathic Medicine

## 2021-10-02 ENCOUNTER — Ambulatory Visit: Payer: PPO | Admitting: Podiatry

## 2021-10-02 DIAGNOSIS — R52 Pain, unspecified: Secondary | ICD-10-CM | POA: Diagnosis not present

## 2021-10-02 DIAGNOSIS — M79674 Pain in right toe(s): Secondary | ICD-10-CM

## 2021-10-02 DIAGNOSIS — B351 Tinea unguium: Secondary | ICD-10-CM

## 2021-10-02 DIAGNOSIS — M79675 Pain in left toe(s): Secondary | ICD-10-CM | POA: Diagnosis not present

## 2021-10-02 DIAGNOSIS — S99922A Unspecified injury of left foot, initial encounter: Secondary | ICD-10-CM | POA: Diagnosis not present

## 2021-10-02 NOTE — Progress Notes (Signed)
Chief Complaint  Patient presents with   foot care    Patient is here for routine foot care, the pat 2nd toe bruised, patient  hit his left great toe this morning an scrapped it.    SUBJECTIVE Patient presents to office today for an injury that was sustained this morning when he got out of the shower.  Patient states that he was barefoot and he tripped on his left great toe.  He says that he injured his toenail and he needs it evaluated.  It was bleeding this morning and he placed Neosporin and a Band-Aid over the area  Patient also complaining of elongated, thickened nails that cause pain while ambulating in shoes.  Patient is unable to trim their own nails. Patient is here for further evaluation and treatment.  Past Medical History:  Diagnosis Date   Actinic keratosis    Allergy    Anxiety 06/17/2014   Basal cell carcinoma 06/24/2009   Left nose sup. nasal alar crease.    Basal cell carcinoma 09/11/2012   Left lateral bicep. Focal sclerosis. Excised: 11/01/2012, margins free.   Basal cell carcinoma 02/19/2021   R preauricular - ED&C   Benign prostatic hyperplasia with urinary obstruction 06/17/2014   Benign prostatic hypertrophy without urinary obstruction 06/20/2013   Bladder calculi 04/26/2013   Calculi, ureter 03/29/2013   Calculus of kidney 02/25/2013   Cataract    Clinical depression 06/17/2014   Degenerative disc disease, lumbar    Frank hematuria 06/28/2013   HLD (hyperlipidemia) 06/17/2014   Hx of squamous cell carcinoma    multiple sites   Spondylolysis    Squamous cell carcinoma of skin 09/11/2012   Right lateral antecubital area. SCCis.    Squamous cell carcinoma of skin 03/10/2015   Right temple hair line. SCCis   Squamous cell carcinoma of skin 07/26/2016   Right temple hair line. SCCis, hypertrophic.    OBJECTIVE General Patient is awake, alert, and oriented x 3 and in no acute distress. Derm Skin is dry and supple bilateral. Negative open lesions or  macerations. Remaining integument unremarkable. Nails are tender, long, thickened and dystrophic with subungual debris, consistent with onychomycosis, 1-5 bilateral. No signs of infection noted. Loosely adhered nail plate noted to the left hallux with injury and minimal bleeding around the base of the nail plate. Vasc  DP and PT pedal pulses palpable bilaterally. Temperature gradient within normal limits.  Neuro Epicritic and protective threshold sensation grossly intact bilaterally.  Musculoskeletal Exam No symptomatic pedal deformities noted bilateral. Muscular strength within normal limits.  ASSESSMENT 1.  Pain due to onychomycosis of toenails both 2.  Traumatic injury left hallux nail plate  PLAN OF CARE 1. Patient evaluated today.  2. Instructed to maintain good pedal hygiene and foot care.  3. Mechanical debridement of nails 1-5 bilaterally performed using a nail nipper. Filed with dremel without incident.  4.  In regards to the injury of the left hallux nail plate, decision was made to perform a total temporary nail avulsion of the left hallux nail plate.  Patient agrees.  The area is prepped aseptically and digital block performed using 3 mL of 2% lidocaine plain.  The nail was avulsed in its entirety with light dressing applied.  Post care instructions provided  5.  Return to clinic in 3 mos.    Edrick Kins, DPM [facility]  Dr. Edrick Kins, DPM    [address]  [place], [place] [address]                Office [phone]  Fax [phone]

## 2021-10-22 DIAGNOSIS — I10 Essential (primary) hypertension: Secondary | ICD-10-CM | POA: Diagnosis not present

## 2021-10-22 DIAGNOSIS — I2510 Atherosclerotic heart disease of native coronary artery without angina pectoris: Secondary | ICD-10-CM | POA: Diagnosis not present

## 2021-10-22 DIAGNOSIS — E782 Mixed hyperlipidemia: Secondary | ICD-10-CM | POA: Diagnosis not present

## 2021-10-22 DIAGNOSIS — Z955 Presence of coronary angioplasty implant and graft: Secondary | ICD-10-CM | POA: Diagnosis not present

## 2021-10-22 DIAGNOSIS — E785 Hyperlipidemia, unspecified: Secondary | ICD-10-CM | POA: Diagnosis not present

## 2021-10-22 DIAGNOSIS — I214 Non-ST elevation (NSTEMI) myocardial infarction: Secondary | ICD-10-CM | POA: Diagnosis not present

## 2021-10-29 DIAGNOSIS — R2689 Other abnormalities of gait and mobility: Secondary | ICD-10-CM | POA: Diagnosis not present

## 2021-10-29 DIAGNOSIS — Z48817 Encounter for surgical aftercare following surgery on the skin and subcutaneous tissue: Secondary | ICD-10-CM | POA: Diagnosis not present

## 2021-10-29 DIAGNOSIS — I214 Non-ST elevation (NSTEMI) myocardial infarction: Secondary | ICD-10-CM | POA: Diagnosis not present

## 2021-11-02 DIAGNOSIS — H18421 Band keratopathy, right eye: Secondary | ICD-10-CM | POA: Diagnosis not present

## 2021-11-18 ENCOUNTER — Ambulatory Visit: Payer: PPO | Admitting: Dermatology

## 2021-11-18 DIAGNOSIS — L570 Actinic keratosis: Secondary | ICD-10-CM | POA: Diagnosis not present

## 2021-11-18 DIAGNOSIS — L820 Inflamed seborrheic keratosis: Secondary | ICD-10-CM | POA: Diagnosis not present

## 2021-11-18 DIAGNOSIS — Z79899 Other long term (current) drug therapy: Secondary | ICD-10-CM | POA: Diagnosis not present

## 2021-11-18 DIAGNOSIS — Z85828 Personal history of other malignant neoplasm of skin: Secondary | ICD-10-CM

## 2021-11-18 DIAGNOSIS — L578 Other skin changes due to chronic exposure to nonionizing radiation: Secondary | ICD-10-CM

## 2021-11-18 DIAGNOSIS — Z5111 Encounter for antineoplastic chemotherapy: Secondary | ICD-10-CM | POA: Diagnosis not present

## 2021-11-18 DIAGNOSIS — Z8589 Personal history of malignant neoplasm of other organs and systems: Secondary | ICD-10-CM

## 2021-11-18 MED ORDER — CALCIPOTRIENE 0.005 % EX CREA: TOPICAL_CREAM | CUTANEOUS | 0 refills | Status: DC

## 2021-11-18 MED ORDER — FLUOROURACIL 5 % EX CREA: TOPICAL_CREAM | CUTANEOUS | 0 refills | Status: DC

## 2021-11-18 NOTE — Patient Instructions (Addendum)
5-Fluorouracil/Calcipotriene Patient Education   Actinic keratoses are the dry, red scaly spots on the skin caused by sun damage. A portion of these spots can turn into skin cancer with time, and treating them can help prevent development of skin cancer.   Treatment of these spots requires removal of the defective skin cells. There are various ways to remove actinic keratoses, including freezing with liquid nitrogen, treatment with creams, or treatment with a blue light procedure in the office.   5-fluorouracil cream is a topical cream used to treat actinic keratoses. It works by interfering with the growth of abnormal fast-growing skin cells, such as actinic keratoses. These cells peel off and are replaced by healthy ones. THIS CREAM SHOULD BE KEPT OUT OF REACH OF CHILDREN AND PETS AND SHOULD NOT BE USED BY PREGNANT WOMEN.  5-fluorouracil/calcipotriene is a combination of the 5-fluorouracil cream with a vitamin D analog cream called calcipotriene. The calcipotriene alone does not treat actinic keratoses. However, when it is combined with 5-fluorouracil, it helps the 5-fluorouracil treat the actinic keratoses much faster so that the same results can be achieved with a much shorter treatment time.  INSTRUCTIONS FOR 5-FLUOROURACIL/CALCIPOTRIENE CREAM:   5-fluorouracil/calcipotriene cream typically only needs to be used for 4-7 days. A thin layer should be applied twice a day to the treatment areas recommended by your physician.   If your physician prescribed you separate tubes of 5-fluourouracil and calcipotriene, apply a thin layer of 5-fluorouracil followed by a thin layer of calcipotriene.   Avoid contact with your eyes or nostrils. Avoid applying the cream to your eyelids or lips unless directed to apply there by your physician. Do not use 5-fluorouracil/calcipotriene cream on infected or open wounds.   You will develop redness, irritation and some crusting at areas where you have pre-cancer  damage/actinic keratoses. IF YOU DEVELOP PAIN, BLEEDING, OR SIGNIFICANT CRUSTING, STOP THE TREATMENT EARLY - you have already gotten a good response and the actinic keratoses should clear up well.  Wash your hands after applying 5-fluorouracil 5% cream on your skin.   A moisturizer or sunscreen with a minimum SPF 30 should be applied each morning.   Once you have finished the treatment, you can apply a thin layer of Vaseline twice a day to irritated areas to soothe and calm the areas more quickly. If you experience significant discomfort, contact your physician.  For some patients it is necessary to repeat the treatment for best results.  SIDE EFFECTS: When using 5-fluorouracil/calcipotriene cream, you may have mild irritation, such as redness, dryness, swelling, or a mild burning sensation. This usually resolves within 2 weeks. The more actinic keratoses you have, the more redness and inflammation you can expect during treatment. Eye irritation has been reported rarely. If this occurs, please let us know.   If you have any trouble using this cream, please send us a MyChart message or call the office. If you have any other questions about this information, please do not hesitate to ask me before you leave the office or contact me on MyChart or by phone.   Due to recent changes in healthcare laws, you may see results of your pathology and/or laboratory studies on MyChart before the doctors have had a chance to review them. We understand that in some cases there may be results that are confusing or concerning to you. Please understand that not all results are received at the same time and often the doctors may need to interpret multiple results in order to provide   you with the best plan of care or course of treatment. Therefore, we ask that you please give us 2 business days to thoroughly review all your results before contacting the office for clarification. Should we see a critical lab result, you  will be contacted sooner.   If You Need Anything After Your Visit  If you have any questions or concerns for your doctor, please call our main line at [phone] and press option 4 to reach your doctor's medical assistant. If no one answers, please leave a voicemail as directed and we will return your call as soon as possible. Messages left after 4 pm will be answered the following business day.   You may also send us a message via MyChart. We typically respond to MyChart messages within 1-2 business days.  For prescription refills, please ask your pharmacy to contact our office. Our fax number is [phone].  If you have an urgent issue when the clinic is closed that cannot wait until the next business day, you can page your doctor at the number below.    Please note that while we do our best to be available for urgent issues outside of office hours, we are not available 24/7.   If you have an urgent issue and are unable to reach us, you may choose to seek medical care at your doctor's office, retail clinic, urgent care center, or emergency room.  If you have a medical emergency, please immediately call 911 or go to the emergency department.  Pager Numbers  - Dr. Kowalski: [phone]  - Dr. Moye: [phone]  - Dr. Stewart: [phone]  In the event of inclement weather, please call our main line at [phone] for an update on the status of any delays or closures.  Dermatology Medication Tips: Please keep the boxes that topical medications come in in order to help keep track of the instructions about where and how to use these. Pharmacies typically print the medication instructions only on the boxes and not directly on the medication tubes.   If your medication is too expensive, please contact our office at [phone] option 4 or send us a message through MyChart.   We are unable to tell what your co-pay for medications will be in advance as this is different depending  on your insurance coverage. However, we may be able to find a substitute medication at lower cost or fill out paperwork to get insurance to cover a needed medication.   If a prior authorization is required to get your medication covered by your insurance company, please allow us 1-2 business days to complete this process.  Drug prices often vary depending on where the prescription is filled and some pharmacies may offer cheaper prices.  The website www.goodrx.com contains coupons for medications through different pharmacies. The prices here do not account for what the cost may be with help from insurance (it may be cheaper with your insurance), but the website can give you the price if you did not use any insurance.  - You can print the associated coupon and take it with your prescription to the pharmacy.  - You may also stop by our office during regular business hours and pick up a GoodRx coupon card.  - If you need your prescription sent electronically to a different pharmacy, notify our office through [place] [facility] MyChart or by phone at [phone] option 4.     Si Usted Necesita Algo Despus de Su Visita  Tambin puede enviarnos un mensaje a   travs de MyChart. Por lo general respondemos a los mensajes de MyChart en el transcurso de 1 a 2 das hbiles.  Para renovar recetas, por favor pida a su farmacia que se ponga en contacto con nuestra oficina. Nuestro nmero de fax es el [phone].  Si tiene un asunto urgente cuando la clnica est cerrada y que no puede esperar hasta el siguiente da hbil, puede llamar/localizar a su doctor(a) al nmero que aparece a continuacin.   Por favor, tenga en cuenta que aunque hacemos todo lo posible para estar disponibles para asuntos urgentes fuera del horario de oficina, no estamos disponibles las 24 horas del da, los 7 das de la semana.   Si tiene un problema urgente y no puede comunicarse con nosotros, puede optar por buscar atencin mdica  en  el consultorio de su doctor(a), en una clnica privada, en un centro de atencin urgente o en una sala de emergencias.  Si tiene una emergencia mdica, por favor llame inmediatamente al 911 o vaya a la sala de emergencias.  Nmeros de bper  - Dr. Kowalski: [phone]  - Dra. Moye: [phone]  - Dra. Stewart: [phone]  En caso de inclemencias del tiempo, por favor llame a nuestra lnea principal al [phone] para una actualizacin sobre el estado de cualquier retraso o cierre.  Consejos para la medicacin en dermatologa: Por favor, guarde las cajas en las que vienen los medicamentos de uso tpico para ayudarle a seguir las instrucciones sobre dnde y cmo usarlos. Las farmacias generalmente imprimen las instrucciones del medicamento slo en las cajas y no directamente en los tubos del medicamento.   Si su medicamento es muy caro, por favor, pngase en contacto con nuestra oficina llamando al [phone] y presione la opcin 4 o envenos un mensaje a travs de MyChart.   No podemos decirle cul ser su copago por los medicamentos por adelantado ya que esto es diferente dependiendo de la cobertura de su seguro. Sin embargo, es posible que podamos encontrar un medicamento sustituto a menor costo o llenar un formulario para que el seguro cubra el medicamento que se considera necesario.   Si se requiere una autorizacin previa para que su compaa de seguros cubra su medicamento, por favor permtanos de 1 a 2 das hbiles para completar este proceso.  Los precios de los medicamentos varan con frecuencia dependiendo del lugar de dnde se surte la receta y alguna farmacias pueden ofrecer precios ms baratos.  El sitio web www.goodrx.com tiene cupones para medicamentos de diferentes farmacias. Los precios aqu no tienen en cuenta lo que podra costar con la ayuda del seguro (puede ser ms barato con su seguro), pero el sitio web puede darle el precio si no utiliz ningn seguro.  -  Puede imprimir el cupn correspondiente y llevarlo con su receta a la farmacia.  - Tambin puede pasar por nuestra oficina durante el horario de atencin regular y recoger una tarjeta de cupones de GoodRx.  - Si necesita que su receta se enve electrnicamente a una farmacia diferente, informe a nuestra oficina a travs de MyChart de [place] o por telfono llamando al [phone] y presione la opcin 4.  

## 2021-11-18 NOTE — Progress Notes (Signed)
Follow-Up Visit   Subjective  Todd Bennett is a 88 y.o. male who presents for the following: Actinic Keratosis (Patient is here today for check of sun exposed areas for precancerous skin lesions). The patient has spots, moles and lesions to be evaluated, some may be new or changing.  The following portions of the chart were reviewed this encounter and updated as appropriate:   Tobacco  Allergies  Meds  Problems  Med Hx  Surg Hx  Fam Hx     Review of Systems:  No other skin or systemic complaints except as noted in HPI or Assessment and Plan.  Objective  Well appearing patient in no apparent distress; mood and affect are within normal limits.  A focused examination was performed including the face, scalp, and hands. Relevant physical exam findings are noted in the Assessment and Plan.  scalp x 10, L ear x 1 (11) Erythematous thin papules/macules with gritty scale.   Forehead, scalp, R infra orbital x 5 (5) Erythematous stuck-on, waxy papule or plaque  Left Lower Eyelid Margin x 1 Erythematous stuck-on, waxy papule or plaque   Assessment & Plan   AK (actinic keratosis) (11) scalp x 10, L ear x 1 Destruction of lesion - scalp x 10, L ear x 1 Complexity: simple   Destruction method: cryotherapy   Informed consent: discussed and consent obtained   Timeout:  patient name, date of birth, surgical site, and procedure verified Lesion destroyed using liquid nitrogen: Yes   Region frozen until ice ball extended beyond lesion: Yes   Outcome: patient tolerated procedure well with no complications   Post-procedure details: wound care instructions given    fluorouracil (EFUDEX) 5 % cream - scalp x 10, L ear x 1 In three weeks (Nov 15th) apply to the scalp BID x 7 days  calcipotriene (DOVONOX) 0.005 % cream - scalp x 10, L ear x 1 In three weeks (Nov 15th) apply to scalp BID x 1 week.  Inflamed seborrheic keratosis Forehead, scalp, R infra orbital x 5 Symptomatic, irritating,  patient would like treated. Destruction of lesion - Forehead, scalp, R infra orbital x 5 Complexity: simple   Destruction method: cryotherapy   Informed consent: discussed and consent obtained   Timeout:  patient name, date of birth, surgical site, and procedure verified Lesion destroyed using liquid nitrogen: Yes   Region frozen until ice ball extended beyond lesion: Yes   Outcome: patient tolerated procedure well with no complications   Post-procedure details: wound care instructions given    Seborrheic keratosis, inflamed Left Lower Eyelid Margin x 1 Symptomatic, irritating, patient would like treated. Destruction of lesion - Left Lower Eyelid Margin x 1 Complexity: simple   Destruction method: cryotherapy   Informed consent: discussed and consent obtained   Timeout:  patient name, date of birth, surgical site, and procedure verified Lesion destroyed using liquid nitrogen: Yes   Region frozen until ice ball extended beyond lesion: Yes   Outcome: patient tolerated procedure well with no complications   Post-procedure details: wound care instructions given    Actinic Damage - Severe, confluent actinic changes with pre-cancerous actinic keratoses  - Severe, chronic, not at goal, secondary to cumulative UV radiation exposure over time - diffuse scaly erythematous macules and papules with underlying dyspigmentation - Discussed Prescription "Field Treatment" for Severe, Chronic Confluent Actinic Changes with Pre-Cancerous Actinic Keratoses Field treatment involves treatment of an entire area of skin that has confluent Actinic Changes (Sun/ Ultraviolet light damage) and PreCancerous  Actinic Keratoses by method of PhotoDynamic Therapy (PDT) and/or prescription Topical Chemotherapy agents such as 5-fluorouracil, 5-fluorouracil/calcipotriene, and/or imiquimod.  The purpose is to decrease the number of clinically evident and subclinical PreCancerous lesions to prevent progression to development  of skin cancer by chemically destroying early precancer changes that may or may not be visible.  It has been shown to reduce the risk of developing skin cancer in the treated area. As a result of treatment, redness, scaling, crusting, and open sores may occur during treatment course. One or more than one of these methods may be used and may have to be used several times to control, suppress and eliminate the PreCancerous changes. Discussed treatment course, expected reaction, and possible side effects. - Recommend daily broad spectrum sunscreen SPF 30+ to sun-exposed areas, reapply every 2 hours as needed.  - Staying in the shade or wearing long sleeves, sun glasses (UVA+UVB protection) and wide brim hats (4-inch brim around the entire circumference of the hat) are also recommended. - Call for new or changing lesions. - In three weeks start 36f/Calcipotriene cream BID on the scalp x 7 days.  History of Squamous Cell Carcinoma of the Skin - No evidence of recurrence today - No lymphadenopathy - Recommend regular full body skin exams - Recommend daily broad spectrum sunscreen SPF 30+ to sun-exposed areas, reapply every 2 hours as needed.  - Call if any new or changing lesions are noted between office visits  History of Basal Cell Carcinoma of the Skin - No evidence of recurrence today - Recommend regular full body skin exams - Recommend daily broad spectrum sunscreen SPF 30+ to sun-exposed areas, reapply every 2 hours as needed.  - Call if any new or changing lesions are noted between office visits  Return in about 6 months (around 05/20/2022) for UBSE.  ILuther Redo CMA, am acting as scribe for DSarina Ser MD . Documentation: I have reviewed the above documentation for accuracy and completeness, and I agree with the above.  DSarina Ser MD

## 2021-11-23 ENCOUNTER — Encounter: Payer: Self-pay | Admitting: Dermatology

## 2021-11-27 DIAGNOSIS — Z48817 Encounter for surgical aftercare following surgery on the skin and subcutaneous tissue: Secondary | ICD-10-CM | POA: Diagnosis not present

## 2021-11-27 DIAGNOSIS — R2689 Other abnormalities of gait and mobility: Secondary | ICD-10-CM | POA: Diagnosis not present

## 2021-11-27 DIAGNOSIS — I214 Non-ST elevation (NSTEMI) myocardial infarction: Secondary | ICD-10-CM | POA: Diagnosis not present

## 2021-12-25 ENCOUNTER — Ambulatory Visit: Payer: PPO | Admitting: Podiatry

## 2021-12-25 DIAGNOSIS — M79674 Pain in right toe(s): Secondary | ICD-10-CM

## 2021-12-25 DIAGNOSIS — M79675 Pain in left toe(s): Secondary | ICD-10-CM

## 2021-12-25 DIAGNOSIS — B351 Tinea unguium: Secondary | ICD-10-CM

## 2021-12-25 DIAGNOSIS — I214 Non-ST elevation (NSTEMI) myocardial infarction: Secondary | ICD-10-CM | POA: Diagnosis not present

## 2021-12-25 DIAGNOSIS — M6281 Muscle weakness (generalized): Secondary | ICD-10-CM | POA: Diagnosis not present

## 2021-12-25 DIAGNOSIS — R2689 Other abnormalities of gait and mobility: Secondary | ICD-10-CM | POA: Diagnosis not present

## 2021-12-25 NOTE — Progress Notes (Signed)
Chief Complaint  Patient presents with   foot care    Patient is here for routine foot care.    SUBJECTIVE Patient presents to office today for an injury that was sustained this morning when he got out of the shower.  Patient states that he was barefoot and he tripped on his left great toe.  He says that he injured his toenail and he needs it evaluated.  It was bleeding this morning and he placed Neosporin and a Band-Aid over the area  Patient also complaining of elongated, thickened nails that cause pain while ambulating in shoes.  Patient is unable to trim their own nails. Patient is here for further evaluation and treatment.  Past Medical History:  Diagnosis Date   Actinic keratosis    Allergy    Anxiety 06/17/2014   Basal cell carcinoma 06/24/2009   Left nose sup. nasal alar crease.    Basal cell carcinoma 09/11/2012   Left lateral bicep. Focal sclerosis. Excised: 11/01/2012, margins free.   Basal cell carcinoma 02/19/2021   R preauricular - ED&C   Benign prostatic hyperplasia with urinary obstruction 06/17/2014   Benign prostatic hypertrophy without urinary obstruction 06/20/2013   Bladder calculi 04/26/2013   Calculi, ureter 03/29/2013   Calculus of kidney 02/25/2013   Cataract    Clinical depression 06/17/2014   Degenerative disc disease, lumbar    Frank hematuria 06/28/2013   HLD (hyperlipidemia) 06/17/2014   Hx of squamous cell carcinoma    multiple sites   Spondylolysis    Squamous cell carcinoma of skin 09/11/2012   Right lateral antecubital area. SCCis.    Squamous cell carcinoma of skin 03/10/2015   Right temple hair line. SCCis   Squamous cell carcinoma of skin 07/26/2016   Right temple hair line. SCCis, hypertrophic.    OBJECTIVE General Patient is awake, alert, and oriented x 3 and in no acute distress. Derm Skin is dry and supple bilateral. Negative open lesions or macerations. Remaining integument unremarkable. Nails are tender, long, thickened and  dystrophic with subungual debris, consistent with onychomycosis, 1-5 bilateral. No signs of infection noted. Loosely adhered nail plate noted to the left hallux with injury and minimal bleeding around the base of the nail plate. Vasc  DP and PT pedal pulses palpable bilaterally. Temperature gradient within normal limits.  Neuro Epicritic and protective threshold sensation grossly intact bilaterally.  Musculoskeletal Exam No symptomatic pedal deformities noted bilateral. Muscular strength within normal limits.  ASSESSMENT 1.  Pain due to onychomycosis of toenails both 2.  Traumatic injury left hallux nail plate  PLAN OF CARE 1. Patient evaluated today.  2. Instructed to maintain good pedal hygiene and foot care.  3. Mechanical debridement of nails 1-5 bilaterally performed using a nail nipper. Filed with dremel without incident.  4.  In regards to the injury of the left hallux nail plate, decision was made to perform a total temporary nail avulsion of the left hallux nail plate.  Patient agrees.  The area is prepped aseptically and digital block performed using 3 mL of 2% lidocaine plain.  The nail was avulsed in its entirety with light dressing applied.  Post care instructions provided  5.  Return to clinic in 3 mos.    Edrick Kins, DPM [facility]  Dr. Edrick Kins, DPM    [address]  [place], [place] [address]                Office [phone]  Fax [phone]

## 2022-01-01 DIAGNOSIS — Z933 Colostomy status: Secondary | ICD-10-CM | POA: Diagnosis not present

## 2022-01-27 DIAGNOSIS — M6281 Muscle weakness (generalized): Secondary | ICD-10-CM | POA: Diagnosis not present

## 2022-01-27 DIAGNOSIS — R2689 Other abnormalities of gait and mobility: Secondary | ICD-10-CM | POA: Diagnosis not present

## 2022-01-27 DIAGNOSIS — I214 Non-ST elevation (NSTEMI) myocardial infarction: Secondary | ICD-10-CM | POA: Diagnosis not present

## 2022-02-26 DIAGNOSIS — M6281 Muscle weakness (generalized): Secondary | ICD-10-CM | POA: Diagnosis not present

## 2022-02-26 DIAGNOSIS — I214 Non-ST elevation (NSTEMI) myocardial infarction: Secondary | ICD-10-CM | POA: Diagnosis not present

## 2022-02-26 DIAGNOSIS — R2689 Other abnormalities of gait and mobility: Secondary | ICD-10-CM | POA: Diagnosis not present

## 2022-03-24 DIAGNOSIS — M5442 Lumbago with sciatica, left side: Secondary | ICD-10-CM | POA: Diagnosis not present

## 2022-03-24 DIAGNOSIS — I2510 Atherosclerotic heart disease of native coronary artery without angina pectoris: Secondary | ICD-10-CM | POA: Diagnosis not present

## 2022-03-24 DIAGNOSIS — M5441 Lumbago with sciatica, right side: Secondary | ICD-10-CM | POA: Diagnosis not present

## 2022-03-24 DIAGNOSIS — F32A Depression, unspecified: Secondary | ICD-10-CM | POA: Diagnosis not present

## 2022-03-24 DIAGNOSIS — E785 Hyperlipidemia, unspecified: Secondary | ICD-10-CM | POA: Diagnosis not present

## 2022-03-24 DIAGNOSIS — N401 Enlarged prostate with lower urinary tract symptoms: Secondary | ICD-10-CM | POA: Diagnosis not present

## 2022-03-26 DIAGNOSIS — R2689 Other abnormalities of gait and mobility: Secondary | ICD-10-CM | POA: Diagnosis not present

## 2022-03-26 DIAGNOSIS — M25511 Pain in right shoulder: Secondary | ICD-10-CM | POA: Diagnosis not present

## 2022-03-26 DIAGNOSIS — M12811 Other specific arthropathies, not elsewhere classified, right shoulder: Secondary | ICD-10-CM | POA: Diagnosis not present

## 2022-03-26 DIAGNOSIS — M19011 Primary osteoarthritis, right shoulder: Secondary | ICD-10-CM | POA: Diagnosis not present

## 2022-03-26 DIAGNOSIS — M6281 Muscle weakness (generalized): Secondary | ICD-10-CM | POA: Diagnosis not present

## 2022-03-26 DIAGNOSIS — I214 Non-ST elevation (NSTEMI) myocardial infarction: Secondary | ICD-10-CM | POA: Diagnosis not present

## 2022-03-26 DIAGNOSIS — S46211A Strain of muscle, fascia and tendon of other parts of biceps, right arm, initial encounter: Secondary | ICD-10-CM | POA: Diagnosis not present

## 2022-03-30 ENCOUNTER — Ambulatory Visit: Payer: PPO | Admitting: Podiatry

## 2022-03-30 ENCOUNTER — Encounter: Payer: Self-pay | Admitting: Podiatry

## 2022-03-30 VITALS — BP 163/61 | HR 62

## 2022-03-30 DIAGNOSIS — M79675 Pain in left toe(s): Secondary | ICD-10-CM

## 2022-03-30 DIAGNOSIS — B351 Tinea unguium: Secondary | ICD-10-CM | POA: Diagnosis not present

## 2022-03-30 DIAGNOSIS — M79674 Pain in right toe(s): Secondary | ICD-10-CM | POA: Diagnosis not present

## 2022-03-30 NOTE — Progress Notes (Signed)
   Chief Complaint  Patient presents with   Nail Problem    "Trim my toenails."    SUBJECTIVE Patient presents to office today complaining of elongated, thickened nails that cause pain while ambulating in shoes.  Patient is unable to trim their own nails. Patient is here for further evaluation and treatment.  Past Medical History:  Diagnosis Date   Actinic keratosis    Allergy    Anxiety 06/17/2014   Basal cell carcinoma 06/24/2009   Left nose sup. nasal alar crease.    Basal cell carcinoma 09/11/2012   Left lateral bicep. Focal sclerosis. Excised: 11/01/2012, margins free.   Basal cell carcinoma 02/19/2021   R preauricular - ED&C   Benign prostatic hyperplasia with urinary obstruction 06/17/2014   Benign prostatic hypertrophy without urinary obstruction 06/20/2013   Bladder calculi 04/26/2013   Calculi, ureter 03/29/2013   Calculus of kidney 02/25/2013   Cataract    Clinical depression 06/17/2014   Degenerative disc disease, lumbar    Frank hematuria 06/28/2013   HLD (hyperlipidemia) 06/17/2014   Hx of squamous cell carcinoma    multiple sites   Spondylolysis    Squamous cell carcinoma of skin 09/11/2012   Right lateral antecubital area. SCCis.    Squamous cell carcinoma of skin 03/10/2015   Right temple hair line. SCCis   Squamous cell carcinoma of skin 07/26/2016   Right temple hair line. SCCis, hypertrophic.    Allergies  Allergen Reactions   Ciprofloxacin Shortness Of Breath and Swelling    Tongue thickened   Clarithromycin Shortness Of Breath and Swelling    Tongue thickened   Elemental Sulfur Shortness Of Breath and Swelling    Tongue thicened   Oxycodone Shortness Of Breath and Swelling    Tongue thickened   Penicillins Shortness Of Breath and Swelling    Tongue thickened and knots on bottom of feet     OBJECTIVE General Patient is awake, alert, and oriented x 3 and in no acute distress. Derm Skin is dry and supple bilateral. Negative open lesions or  macerations. Remaining integument unremarkable. Nails are tender, long, thickened and dystrophic with subungual debris, consistent with onychomycosis, 1-5 bilateral. No signs of infection noted. Vasc  DP and PT pedal pulses palpable bilaterally. Temperature gradient within normal limits.  Neuro Epicritic and protective threshold sensation grossly intact bilaterally.  Musculoskeletal Exam No symptomatic pedal deformities noted bilateral. Muscular strength within normal limits.  ASSESSMENT 1.  Pain due to onychomycosis of toenails both  PLAN OF CARE 1. Patient evaluated today.  2. Instructed to maintain good pedal hygiene and foot care.  3. Mechanical debridement of nails 1-5 bilaterally performed using a nail nipper. Filed with dremel without incident.  4. Return to clinic in 3 mos.    Edrick Kins, DPM [facility]  Dr. Edrick Kins, DPM    [address], [place] [address]                Office [phone]  Fax [phone]

## 2022-04-02 DIAGNOSIS — M4316 Spondylolisthesis, lumbar region: Secondary | ICD-10-CM | POA: Diagnosis not present

## 2022-04-02 DIAGNOSIS — M5441 Lumbago with sciatica, right side: Secondary | ICD-10-CM | POA: Diagnosis not present

## 2022-04-02 DIAGNOSIS — M1611 Unilateral primary osteoarthritis, right hip: Secondary | ICD-10-CM | POA: Diagnosis not present

## 2022-04-02 DIAGNOSIS — M5136 Other intervertebral disc degeneration, lumbar region: Secondary | ICD-10-CM | POA: Diagnosis not present

## 2022-04-02 DIAGNOSIS — M47818 Spondylosis without myelopathy or radiculopathy, sacral and sacrococcygeal region: Secondary | ICD-10-CM | POA: Diagnosis not present

## 2022-04-02 DIAGNOSIS — M76891 Other specified enthesopathies of right lower limb, excluding foot: Secondary | ICD-10-CM | POA: Diagnosis not present

## 2022-04-02 DIAGNOSIS — M5442 Lumbago with sciatica, left side: Secondary | ICD-10-CM | POA: Diagnosis not present

## 2022-04-02 DIAGNOSIS — M5137 Other intervertebral disc degeneration, lumbosacral region: Secondary | ICD-10-CM | POA: Diagnosis not present

## 2022-04-02 DIAGNOSIS — M7061 Trochanteric bursitis, right hip: Secondary | ICD-10-CM | POA: Diagnosis not present

## 2022-04-02 DIAGNOSIS — G8929 Other chronic pain: Secondary | ICD-10-CM | POA: Diagnosis not present

## 2022-04-08 DIAGNOSIS — D649 Anemia, unspecified: Secondary | ICD-10-CM | POA: Diagnosis not present

## 2022-04-20 DIAGNOSIS — E785 Hyperlipidemia, unspecified: Secondary | ICD-10-CM | POA: Diagnosis not present

## 2022-04-20 DIAGNOSIS — I2510 Atherosclerotic heart disease of native coronary artery without angina pectoris: Secondary | ICD-10-CM | POA: Diagnosis not present

## 2022-04-20 DIAGNOSIS — K219 Gastro-esophageal reflux disease without esophagitis: Secondary | ICD-10-CM | POA: Diagnosis not present

## 2022-04-20 DIAGNOSIS — I10 Essential (primary) hypertension: Secondary | ICD-10-CM | POA: Diagnosis not present

## 2022-04-20 DIAGNOSIS — E782 Mixed hyperlipidemia: Secondary | ICD-10-CM | POA: Diagnosis not present

## 2022-04-20 DIAGNOSIS — I214 Non-ST elevation (NSTEMI) myocardial infarction: Secondary | ICD-10-CM | POA: Diagnosis not present

## 2022-04-20 DIAGNOSIS — Z955 Presence of coronary angioplasty implant and graft: Secondary | ICD-10-CM | POA: Diagnosis not present

## 2022-04-26 DIAGNOSIS — M6281 Muscle weakness (generalized): Secondary | ICD-10-CM | POA: Diagnosis not present

## 2022-04-26 DIAGNOSIS — I214 Non-ST elevation (NSTEMI) myocardial infarction: Secondary | ICD-10-CM | POA: Diagnosis not present

## 2022-04-26 DIAGNOSIS — R2689 Other abnormalities of gait and mobility: Secondary | ICD-10-CM | POA: Diagnosis not present

## 2022-05-04 DIAGNOSIS — Z933 Colostomy status: Secondary | ICD-10-CM | POA: Diagnosis not present

## 2022-05-07 DIAGNOSIS — M12811 Other specific arthropathies, not elsewhere classified, right shoulder: Secondary | ICD-10-CM | POA: Diagnosis not present

## 2022-05-07 DIAGNOSIS — M25511 Pain in right shoulder: Secondary | ICD-10-CM | POA: Diagnosis not present

## 2022-05-07 DIAGNOSIS — S46211D Strain of muscle, fascia and tendon of other parts of biceps, right arm, subsequent encounter: Secondary | ICD-10-CM | POA: Diagnosis not present

## 2022-05-07 DIAGNOSIS — M19011 Primary osteoarthritis, right shoulder: Secondary | ICD-10-CM | POA: Diagnosis not present

## 2022-05-10 DIAGNOSIS — H18421 Band keratopathy, right eye: Secondary | ICD-10-CM | POA: Diagnosis not present

## 2022-05-10 DIAGNOSIS — H5213 Myopia, bilateral: Secondary | ICD-10-CM | POA: Diagnosis not present

## 2022-05-11 ENCOUNTER — Encounter: Payer: Self-pay | Admitting: Urology

## 2022-05-11 ENCOUNTER — Ambulatory Visit: Payer: PPO | Admitting: Urology

## 2022-05-11 VITALS — BP 114/68 | HR 60 | Ht 71.0 in | Wt 160.0 lb

## 2022-05-11 DIAGNOSIS — N400 Enlarged prostate without lower urinary tract symptoms: Secondary | ICD-10-CM | POA: Diagnosis not present

## 2022-05-11 LAB — BLADDER SCAN AMB NON-IMAGING

## 2022-05-11 NOTE — Progress Notes (Signed)
I, Amy L Pierron,acting as a scribe for Vanna Scotland, MD.,have documented all relevant documentation on the behalf of Vanna Scotland, MD,as directed by  Vanna Scotland, MD while in the presence of Vanna Scotland, MD.  05/11/2022 11:49 AM   Todd Bennett 1932/08/07 [id number]  Referring provider: Dorothey Baseman, MD [phone] [place],  [place] [address]  Chief Complaint  Patient presents with   Follow-up   Benign Prostatic Hypertrophy    HPI: 89 year-old male with a personal history of BPH with BOO 120 gram prostate, presents today for follow-up.  He previously considered HoLep but had multiple issues in the interim including the death of his wife. Therefore he was managed conservatively with Finasteride and Flomax until now.   He reports having arthritis and he does PT for that. He has hip pain and a couple pinched nerves. Otherwise he is doing well overall.  He said after urinating it feels like he needs to go again but when he does it is minimal. He only has an accident once in a while if he is in the car for a long time. He can tell the difference if he misses a dose of the Finasteride or Flomax.    IPSS     Row Name 05/11/22 1100         International Prostate Symptom Score   How often have you had the sensation of not emptying your bladder? Less than 1 in 5     How often have you had to urinate less than every two hours? Less than 1 in 5 times     How often have you found you stopped and started again several times when you urinated? Not at All     How often have you found it difficult to postpone urination? Almost always     How often have you had a weak urinary stream? Not at All     How often have you had to strain to start urination? Not at All     How many times did you typically get up at night to urinate? 1 Time     Total IPSS Score 8       Quality of Life due to urinary symptoms   If you were to spend the rest of your life with your urinary condition just  the way it is now how would you feel about that? Pleased            Score:  1-7 Mild 8-19 Moderate 20-35 Severe Results for orders placed or performed in visit on 05/11/22  Bladder Scan (Post Void Residual) in office  Result Value Ref Range   Scan Result 18ml     PMH: Past Medical History:  Diagnosis Date   Actinic keratosis    Allergy    Anxiety 06/17/2014   Basal cell carcinoma 06/24/2009   Left nose sup. nasal alar crease.    Basal cell carcinoma 09/11/2012   Left lateral bicep. Focal sclerosis. Excised: 11/01/2012, margins free.   Basal cell carcinoma 02/19/2021   R preauricular - ED&C   Benign prostatic hyperplasia with urinary obstruction 06/17/2014   Benign prostatic hypertrophy without urinary obstruction 06/20/2013   Bladder calculi 04/26/2013   Calculi, ureter 03/29/2013   Calculus of kidney 02/25/2013   Cataract    Clinical depression 06/17/2014   Degenerative disc disease, lumbar    Frank hematuria 06/28/2013   HLD (hyperlipidemia) 06/17/2014   Hx of squamous cell carcinoma    multiple  sites   Spondylolysis    Squamous cell carcinoma of skin 09/11/2012   Right lateral antecubital area. SCCis.    Squamous cell carcinoma of skin 03/10/2015   Right temple hair line. SCCis   Squamous cell carcinoma of skin 07/26/2016   Right temple hair line. SCCis, hypertrophic.    Surgical History: Past Surgical History:  Procedure Laterality Date   CORONARY STENT INTERVENTION N/A 03/15/2017   Procedure: CORONARY STENT INTERVENTION;  Surgeon: Alwyn Pea, MD;  Location: [facility] INVASIVE CV LAB;  Service: Cardiovascular;  Laterality: N/A;   HERNIA REPAIR  1993   ILEOSTOMY     IR ANGIOGRAM EXTREMITY LEFT  02/10/2018   IR ANGIOGRAM SELECTIVE EACH ADDITIONAL VESSEL  02/10/2018   IR ANGIOGRAM SELECTIVE EACH ADDITIONAL VESSEL  02/10/2018   IR ANGIOGRAM SELECTIVE EACH ADDITIONAL VESSEL  02/10/2018   IR EMBO VENOUS NOT HEMORR HEMANG  INC GUIDE ROADMAPPING  02/10/2018   IR  US GUIDE VASC ACCESS RIGHT  02/10/2018   LEFT HEART CATH AND CORONARY ANGIOGRAPHY N/A 03/15/2017   Procedure: LEFT HEART CATH AND CORONARY ANGIOGRAPHY;  Surgeon: Dalia Heading, MD;  Location: [facility] INVASIVE CV LAB;  Service: Cardiovascular;  Laterality: N/A;   LITHOTRIPSY  03/2013    Home Medications:  Allergies as of 05/11/2022       Reactions   Ciprofloxacin Shortness Of Breath, Swelling   Tongue thickened   Clarithromycin Shortness Of Breath, Swelling   Tongue thickened   Elemental Sulfur Shortness Of Breath, Swelling   Tongue thicened   Oxycodone Shortness Of Breath, Swelling   Tongue thickened   Penicillins Shortness Of Breath, Swelling   Tongue thickened and knots on bottom of feet        Medication List        Accurate as of May 11, 2022 11:49 AM. If you have any questions, ask your nurse or doctor.          aspirin EC 81 MG tablet Take 81 mg by mouth every morning.   atorvastatin 40 MG tablet Commonly known as: LIPITOR Take 1 tablet (40 mg total) by mouth daily at 6 PM.   calcipotriene 0.005 % cream Commonly known as: DOVONOX In three weeks (Nov 15th) apply to scalp BID x 1 week.   Cholecalciferol 25 MCG (1000 UT) tablet Take 1,000 Units by mouth every morning.   clopidogrel 75 MG tablet Commonly known as: PLAVIX Take 1 tablet (75 mg total) by mouth daily. Note: plan continue Plavix + Aspirin for 6 months (through 01/2022)   diazepam 5 MG tablet Commonly known as: VALIUM Take 2.5 mg by mouth daily as needed for anxiety.   finasteride 5 MG tablet Commonly known as: PROSCAR TAKE ONE TABLET EVERY DAY   Fish Oil 1000 MG Caps Take 1,000 mg by mouth daily.   fluorouracil 5 % cream Commonly known as: EFUDEX In three weeks (Nov 15th) apply to the scalp BID x 7 days   FLUoxetine 20 MG capsule Commonly known as: PROZAC Take 20 mg by mouth every morning. Take with 10mg  capsule to equal 30mg    losartan 25 MG tablet Commonly known as: COZAAR Take 1  tablet (25 mg total) by mouth daily.   metoprolol tartrate 25 MG tablet Commonly known as: LOPRESSOR Take 0.5 tablets (12.5 mg total) by mouth 2 (two) times daily.   mirtazapine 7.5 MG tablet Commonly known as: REMERON Take 7.5 mg by mouth at bedtime.   Multi-Vitamins Tabs Take 1 tablet by mouth every morning.  nitroGLYCERIN 0.4 MG SL tablet Commonly known as: NITROSTAT Place 1 tablet (0.4 mg total) under the tongue every 5 (five) minutes x 3 doses as needed for chest pain.   omeprazole 20 MG capsule Commonly known as: PRILOSEC Take 20 mg by mouth daily.   tamsulosin 0.4 MG Caps capsule Commonly known as: FLOMAX Take 0.4 mg by mouth every evening.   vitamin C 1000 MG tablet Take 1,000 mg by mouth every morning.        Allergies:  Allergies  Allergen Reactions   Ciprofloxacin Shortness Of Breath and Swelling    Tongue thickened   Clarithromycin Shortness Of Breath and Swelling    Tongue thickened   Elemental Sulfur Shortness Of Breath and Swelling    Tongue thicened   Oxycodone Shortness Of Breath and Swelling    Tongue thickened   Penicillins Shortness Of Breath and Swelling    Tongue thickened and knots on bottom of feet    Family History: Family History  Problem Relation Age of Onset   Diabetes Mother    Prostate cancer Neg Hx    Bladder Cancer Neg Hx     Social History:  reports that he has quit smoking. He has never used smokeless tobacco. He reports that he does not drink alcohol and does not use drugs.   Physical Exam: BP 114/68   Pulse 60   Ht  (1.803 m)   Wt 160 lb (72.6 kg)   BMI 22.32 kg/m   Constitutional:  Alert and oriented, No acute distress. HEENT: [place] AT, moist mucus membranes.  Trachea midline, no masses. Neurologic: Grossly intact, no focal deficits, moving all 4 extremities. Psychiatric: Normal mood and affect.   Assessment & Plan:    BPH  - Since his urinary symptoms seem well controlled and he's emptying adequately,  would recommend continued conservative management in light of his aging comorbidities.  -continue flomax/ finasteride  Return in about 1 year (around 05/11/2023) for reassessment of conservative management.  I have reviewed the above documentation for accuracy and completeness, and I agree with the above.   Vanna Scotland, MD   [facility] [address] [place], [place] [address] [phone]

## 2022-05-26 ENCOUNTER — Ambulatory Visit: Payer: PPO | Admitting: Dermatology

## 2022-05-26 VITALS — BP 148/79 | HR 74

## 2022-05-26 DIAGNOSIS — L570 Actinic keratosis: Secondary | ICD-10-CM | POA: Diagnosis not present

## 2022-05-26 DIAGNOSIS — Z79899 Other long term (current) drug therapy: Secondary | ICD-10-CM

## 2022-05-26 DIAGNOSIS — L820 Inflamed seborrheic keratosis: Secondary | ICD-10-CM | POA: Diagnosis not present

## 2022-05-26 DIAGNOSIS — Z8589 Personal history of malignant neoplasm of other organs and systems: Secondary | ICD-10-CM

## 2022-05-26 DIAGNOSIS — W908XXA Exposure to other nonionizing radiation, initial encounter: Secondary | ICD-10-CM

## 2022-05-26 DIAGNOSIS — Z7189 Other specified counseling: Secondary | ICD-10-CM

## 2022-05-26 DIAGNOSIS — L578 Other skin changes due to chronic exposure to nonionizing radiation: Secondary | ICD-10-CM | POA: Diagnosis not present

## 2022-05-26 DIAGNOSIS — L719 Rosacea, unspecified: Secondary | ICD-10-CM | POA: Diagnosis not present

## 2022-05-26 DIAGNOSIS — Z85828 Personal history of other malignant neoplasm of skin: Secondary | ICD-10-CM | POA: Diagnosis not present

## 2022-05-26 DIAGNOSIS — X32XXXA Exposure to sunlight, initial encounter: Secondary | ICD-10-CM

## 2022-05-26 DIAGNOSIS — B078 Other viral warts: Secondary | ICD-10-CM | POA: Diagnosis not present

## 2022-05-26 MED ORDER — METRONIDAZOLE 0.75 % EX CREA: TOPICAL_CREAM | CUTANEOUS | 6 refills | Status: DC

## 2022-05-26 NOTE — Progress Notes (Signed)
Follow-Up Visit   Subjective  Todd Bennett is a 89 y.o. male who presents for the following: 6 months f/u hx of Aks, hx of BCC, hx of SCC. Patient was prescribed 5FU/Calcipotriene cream 6 months ago for precancers on the scalp but he never started the cream because he read the side effects.  The patient has spots, moles and lesions to be evaluated, some may be new or changing and the patient may have concern these could be cancer.  The following portions of the chart were reviewed this encounter and updated as appropriate: medications, allergies, medical history  Review of Systems:  No other skin or systemic complaints except as noted in HPI or Assessment and Plan.  Objective  Well appearing patient in no apparent distress; mood and affect are within normal limits.   Skin examined face, scalp, hands. Relevant physical exam findings are noted in the Assessment and Plan.  face,scalp, ears (15) Erythematous thin papules/macules with gritty scale.   right scalp temple x 1 Stuck-on, waxy, tan-brown papule  --Discussed benign etiology and prognosis.   left lower eyelid margin x 1 Stuck-on, waxy, tan-brown papule--Discussed benign etiology and prognosis.   left 5th finger Verrucous papules -- Discussed viral etiology and contagion.   face Mainly clear    Assessment & Plan   AK (actinic keratosis) (15) face,scalp, ears  Actinic keratoses are precancerous spots that appear secondary to cumulative UV radiation exposure/sun exposure over time. They are chronic with expected duration over 1 year. A portion of actinic keratoses will progress to squamous cell carcinoma of the skin. It is not possible to reliably predict which spots will progress to skin cancer and so treatment is recommended to prevent development of skin cancer.  Recommend daily broad spectrum sunscreen SPF 30+ to sun-exposed areas, reapply every 2 hours as needed.  Recommend staying in the shade or wearing long sleeves,  sun glasses (UVA+UVB protection) and wide brim hats (4-inch brim around the entire circumference of the hat). Call for new or changing lesions.   Destruction of lesion - face,scalp, ears Complexity: simple   Destruction method: cryotherapy   Informed consent: discussed and consent obtained   Timeout:  patient name, date of birth, surgical site, and procedure verified Lesion destroyed using liquid nitrogen: Yes   Region frozen until ice ball extended beyond lesion: Yes   Outcome: patient tolerated procedure well with no complications   Post-procedure details: wound care instructions given    Inflamed seborrheic keratosis right scalp temple x 1  Symptomatic, irritating, patient would like treated.   Destruction of lesion - right scalp temple x 1 Complexity: simple   Destruction method: cryotherapy   Informed consent: discussed and consent obtained   Timeout:  patient name, date of birth, surgical site, and procedure verified Lesion destroyed using liquid nitrogen: Yes   Region frozen until ice ball extended beyond lesion: Yes   Outcome: patient tolerated procedure well with no complications   Post-procedure details: wound care instructions given    Seborrheic keratosis, inflamed left lower eyelid margin x 1  Symptomatic, irritating, patient would like treated.   Destruction of lesion - left lower eyelid margin x 1 Complexity: simple   Destruction method: cryotherapy   Informed consent: discussed and consent obtained   Timeout:  patient name, date of birth, surgical site, and procedure verified Lesion destroyed using liquid nitrogen: Yes   Region frozen until ice ball extended beyond lesion: Yes   Outcome: patient tolerated procedure well with no  complications   Post-procedure details: wound care instructions given    Other viral warts left 5th finger  Viral Wart (HPV) Counseling  Discussed viral / HPV (Human Papilloma Virus) etiology and risk of spread /infectivity to  other areas of body as well as to other people.  Multiple treatments and methods may be required to clear warts and it is possible treatment may not be successful.  Treatment risks include discoloration; scarring and there is still potential for wart recurrence.   Destruction of lesion - left 5th finger Complexity: simple   Destruction method: cryotherapy   Informed consent: discussed and consent obtained   Timeout:  patient name, date of birth, surgical site, and procedure verified Lesion destroyed using liquid nitrogen: Yes   Region frozen until ice ball extended beyond lesion: Yes   Outcome: patient tolerated procedure well with no complications   Post-procedure details: wound care instructions given    Rosacea face  Rosacea is a chronic progressive skin condition usually affecting the face of adults, causing redness and/or acne bumps. It is treatable but not curable. It sometimes affects the eyes (ocular rosacea) as well. It may respond to topical and/or systemic medication and can flare with stress, sun exposure, alcohol, exercise, topical steroids (including hydrocortisone/cortisone 10) and some foods.  Daily application of broad spectrum spf 30+ sunscreen to face is recommended to reduce flares.   Continue Metrogel apply to face once or twice a day   ACTINIC DAMAGE WITH PRECANCEROUS ACTINIC KERATOSES Counseling for Topical Chemotherapy Management: Patient exhibits: - Severe, confluent actinic changes with pre-cancerous actinic keratoses that is secondary to cumulative UV radiation exposure over time - Condition that is severe; chronic, not at goal. - diffuse scaly erythematous macules and papules with underlying dyspigmentation - Discussed Prescription "Field Treatment" topical Chemotherapy for Severe, Chronic Confluent Actinic Changes with Pre-Cancerous Actinic Keratoses Field treatment involves treatment of an entire area of skin that has confluent Actinic Changes (Sun/ Ultraviolet  light damage) and PreCancerous Actinic Keratoses by method of PhotoDynamic Therapy (PDT) and/or prescription Topical Chemotherapy agents such as 5-fluorouracil, 5-fluorouracil/calcipotriene, and/or imiquimod.  The purpose is to decrease the number of clinically evident and subclinical PreCancerous lesions to prevent progression to development of skin cancer by chemically destroying early precancer changes that may or may not be visible.  It has been shown to reduce the risk of developing skin cancer in the treated area. As a result of treatment, redness, scaling, crusting, and open sores may occur during treatment course. One or more than one of these methods may be used and may have to be used several times to control, suppress and eliminate the PreCancerous changes. Discussed treatment course, expected reaction, and possible side effects. - Recommend daily broad spectrum sunscreen SPF 30+ to sun-exposed areas, reapply every 2 hours as needed.  - Staying in the shade or wearing long sleeves, sun glasses (UVA+UVB protection) and wide brim hats (4-inch brim around the entire circumference of the hat) are also recommended. - Call for new or changing lesions.  Patient decline 5FU/Calcipotriene cream  May consider red light in the fall   HISTORY OF BASAL CELL CARCINOMA OF THE SKIN - No evidence of recurrence today - Recommend regular full body skin exams - Recommend daily broad spectrum sunscreen SPF 30+ to sun-exposed areas, reapply every 2 hours as needed.  - Call if any new or changing lesions are noted between office visits   HISTORY OF SQUAMOUS CELL CARCINOMA OF THE SKIN - No evidence of recurrence  today - No lymphadenopathy - Recommend regular full body skin exams - Recommend daily broad spectrum sunscreen SPF 30+ to sun-exposed areas, reapply every 2 hours as needed.  - Call if any new or changing lesions are noted between office visits   Return in about 6 months for Aks, hx of skin cancers    I, Angelique Holm, CMA, am acting as scribe for Armida Sans, MD .   Documentation: I have reviewed the above documentation for accuracy and completeness, and I agree with the above.  Armida Sans, MD

## 2022-05-26 NOTE — Patient Instructions (Addendum)
Cryotherapy Aftercare  Wash gently with soap and water everyday.   Apply Vaseline and Band-Aid daily until healed.     Due to recent changes in healthcare laws, you may see results of your pathology and/or laboratory studies on MyChart before the doctors have had a chance to review them. We understand that in some cases there may be results that are confusing or concerning to you. Please understand that not all results are received at the same time and often the doctors may need to interpret multiple results in order to provide you with the best plan of care or course of treatment. Therefore, we ask that you please give us 2 business days to thoroughly review all your results before contacting the office for clarification. Should we see a critical lab result, you will be contacted sooner.   If You Need Anything After Your Visit  If you have any questions or concerns for your doctor, please call our main line at [phone] and press option 4 to reach your doctor's medical assistant. If no one answers, please leave a voicemail as directed and we will return your call as soon as possible. Messages left after 4 pm will be answered the following business day.   You may also send us a message via MyChart. We typically respond to MyChart messages within 1-2 business days.  For prescription refills, please ask your pharmacy to contact our office. Our fax number is [phone].  If you have an urgent issue when the clinic is closed that cannot wait until the next business day, you can page your doctor at the number below.    Please note that while we do our best to be available for urgent issues outside of office hours, we are not available 24/7.   If you have an urgent issue and are unable to reach us, you may choose to seek medical care at your doctor's office, retail clinic, urgent care center, or emergency room.  If you have a medical emergency, please immediately call 911 or go to the  emergency department.  Pager Numbers  - Dr. Kowalski: [phone]  - Dr. Moye: [phone]  - Dr. Stewart: [phone]  In the event of inclement weather, please call our main line at [phone] for an update on the status of any delays or closures.  Dermatology Medication Tips: Please keep the boxes that topical medications come in in order to help keep track of the instructions about where and how to use these. Pharmacies typically print the medication instructions only on the boxes and not directly on the medication tubes.   If your medication is too expensive, please contact our office at [phone] option 4 or send us a message through MyChart.   We are unable to tell what your co-pay for medications will be in advance as this is different depending on your insurance coverage. However, we may be able to find a substitute medication at lower cost or fill out paperwork to get insurance to cover a needed medication.   If a prior authorization is required to get your medication covered by your insurance company, please allow us 1-2 business days to complete this process.  Drug prices often vary depending on where the prescription is filled and some pharmacies may offer cheaper prices.  The website www.goodrx.com contains coupons for medications through different pharmacies. The prices here do not account for what the cost may be with help from insurance (it may be cheaper with your insurance), but the website can   give you the price if you did not use any insurance.  - You can print the associated coupon and take it with your prescription to the pharmacy.  - You may also stop by our office during regular business hours and pick up a GoodRx coupon card.  - If you need your prescription sent electronically to a different pharmacy, notify our office through [facility] MyChart or by phone at [phone] option 4.     Si Usted Necesita Algo Despus de Su Visita  Tambin puede  enviarnos un mensaje a travs de MyChart. Por lo general respondemos a los mensajes de MyChart en el transcurso de 1 a 2 das hbiles.  Para renovar recetas, por favor pida a su farmacia que se ponga en contacto con nuestra oficina. Nuestro nmero de fax es el [phone].  Si tiene un asunto urgente cuando la clnica est cerrada y que no puede esperar hasta el siguiente da hbil, puede llamar/localizar a su doctor(a) al nmero que aparece a continuacin.   Por favor, tenga en cuenta que aunque hacemos todo lo posible para estar disponibles para asuntos urgentes fuera del horario de oficina, no estamos disponibles las 24 horas del da, los 7 das de la semana.   Si tiene un problema urgente y no puede comunicarse con nosotros, puede optar por buscar atencin mdica  en el consultorio de su doctor(a), en una clnica privada, en un centro de atencin urgente o en una sala de emergencias.  Si tiene una emergencia mdica, por favor llame inmediatamente al 911 o vaya a la sala de emergencias.  Nmeros de bper  - Dr. Kowalski: [phone]  - Dra. Moye: [phone]  - Dra. Stewart: [phone]  En caso de inclemencias del tiempo, por favor llame a nuestra lnea principal al [phone] para una actualizacin sobre el estado de cualquier retraso o cierre.  Consejos para la medicacin en dermatologa: Por favor, guarde las cajas en las que vienen los medicamentos de uso tpico para ayudarle a seguir las instrucciones sobre dnde y cmo usarlos. Las farmacias generalmente imprimen las instrucciones del medicamento slo en las cajas y no directamente en los tubos del medicamento.   Si su medicamento es muy caro, por favor, pngase en contacto con nuestra oficina llamando al [phone] y presione la opcin 4 o envenos un mensaje a travs de MyChart.   No podemos decirle cul ser su copago por los medicamentos por adelantado ya que esto es diferente dependiendo de la cobertura de su seguro.  Sin embargo, es posible que podamos encontrar un medicamento sustituto a menor costo o llenar un formulario para que el seguro cubra el medicamento que se considera necesario.   Si se requiere una autorizacin previa para que su compaa de seguros cubra su medicamento, por favor permtanos de 1 a 2 das hbiles para completar este proceso.  Los precios de los medicamentos varan con frecuencia dependiendo del lugar de dnde se surte la receta y alguna farmacias pueden ofrecer precios ms baratos.  El sitio web www.goodrx.com tiene cupones para medicamentos de diferentes farmacias. Los precios aqu no tienen en cuenta lo que podra costar con la ayuda del seguro (puede ser ms barato con su seguro), pero el sitio web puede darle el precio si no utiliz ningn seguro.  - Puede imprimir el cupn correspondiente y llevarlo con su receta a la farmacia.  - Tambin puede pasar por nuestra oficina durante el horario de atencin regular y recoger una tarjeta de cupones de GoodRx.  -   Si necesita que su receta se enve electrnicamente a una farmacia diferente, informe a nuestra oficina a travs de MyChart de [facility] o por telfono llamando al [phone] y presione la opcin 4.  

## 2022-06-01 ENCOUNTER — Encounter: Payer: Self-pay | Admitting: Dermatology

## 2022-06-04 DIAGNOSIS — R2689 Other abnormalities of gait and mobility: Secondary | ICD-10-CM | POA: Diagnosis not present

## 2022-06-04 DIAGNOSIS — M6281 Muscle weakness (generalized): Secondary | ICD-10-CM | POA: Diagnosis not present

## 2022-06-04 DIAGNOSIS — I214 Non-ST elevation (NSTEMI) myocardial infarction: Secondary | ICD-10-CM | POA: Diagnosis not present

## 2022-07-02 ENCOUNTER — Ambulatory Visit: Payer: PPO | Admitting: Podiatry

## 2022-07-02 DIAGNOSIS — M79674 Pain in right toe(s): Secondary | ICD-10-CM | POA: Diagnosis not present

## 2022-07-02 DIAGNOSIS — M79675 Pain in left toe(s): Secondary | ICD-10-CM

## 2022-07-02 DIAGNOSIS — B351 Tinea unguium: Secondary | ICD-10-CM | POA: Diagnosis not present

## 2022-07-02 NOTE — Progress Notes (Signed)
   Chief Complaint  Patient presents with   Nail Problem    Thick painful toenails, 3 month follow up    SUBJECTIVE Patient presents to office today complaining of elongated, thickened nails that cause pain while ambulating in shoes.  Patient is unable to trim their own nails. Patient is here for further evaluation and treatment.  Past Medical History:  Diagnosis Date   Actinic keratosis    Allergy    Anxiety 06/17/2014   Basal cell carcinoma 06/24/2009   Left nose sup. nasal alar crease.    Basal cell carcinoma 09/11/2012   Left lateral bicep. Focal sclerosis. Excised: 11/01/2012, margins free.   Basal cell carcinoma 02/19/2021   R preauricular - ED&C   Benign prostatic hyperplasia with urinary obstruction 06/17/2014   Benign prostatic hypertrophy without urinary obstruction 06/20/2013   Bladder calculi 04/26/2013   Calculi, ureter 03/29/2013   Calculus of kidney 02/25/2013   Cataract    Clinical depression 06/17/2014   Degenerative disc disease, lumbar    Frank hematuria 06/28/2013   HLD (hyperlipidemia) 06/17/2014   Hx of squamous cell carcinoma    multiple sites   Spondylolysis    Squamous cell carcinoma of skin 09/11/2012   Right lateral antecubital area. SCCis.    Squamous cell carcinoma of skin 03/10/2015   Right temple hair line. SCCis   Squamous cell carcinoma of skin 07/26/2016   Right temple hair line. SCCis, hypertrophic.    Allergies  Allergen Reactions   Ciprofloxacin Shortness Of Breath and Swelling    Tongue thickened   Clarithromycin Shortness Of Breath and Swelling    Tongue thickened   Elemental Sulfur Shortness Of Breath and Swelling    Tongue thicened   Oxycodone Shortness Of Breath and Swelling    Tongue thickened   Penicillins Shortness Of Breath and Swelling    Tongue thickened and knots on bottom of feet     OBJECTIVE General Patient is awake, alert, and oriented x 3 and in no acute distress. Derm Skin is dry and supple bilateral.  Negative open lesions or macerations. Remaining integument unremarkable. Nails are tender, long, thickened and dystrophic with subungual debris, consistent with onychomycosis, 1-5 bilateral. No signs of infection noted. Vasc  DP and PT pedal pulses palpable bilaterally. Temperature gradient within normal limits.  Neuro Epicritic and protective threshold sensation grossly intact bilaterally.  Musculoskeletal Exam No symptomatic pedal deformities noted bilateral. Muscular strength within normal limits.  ASSESSMENT 1.  Pain due to onychomycosis of toenails both  PLAN OF CARE 1. Patient evaluated today.  2. Instructed to maintain good pedal hygiene and foot care.  3. Mechanical debridement of nails 1-5 bilaterally performed using a nail nipper. Filed with dremel without incident.  4. Return to clinic in 3 mos.    Felecia Shelling, DPM [facility]  Dr. Felecia Shelling, DPM    [address] [place], [place] [address]                Office [phone]  Fax [phone]

## 2022-09-06 DIAGNOSIS — Z933 Colostomy status: Secondary | ICD-10-CM | POA: Diagnosis not present

## 2022-09-24 DIAGNOSIS — K219 Gastro-esophageal reflux disease without esophagitis: Secondary | ICD-10-CM | POA: Diagnosis not present

## 2022-09-24 DIAGNOSIS — Z0000 Encounter for general adult medical examination without abnormal findings: Secondary | ICD-10-CM | POA: Diagnosis not present

## 2022-09-24 DIAGNOSIS — I2510 Atherosclerotic heart disease of native coronary artery without angina pectoris: Secondary | ICD-10-CM | POA: Diagnosis not present

## 2022-09-24 DIAGNOSIS — M5441 Lumbago with sciatica, right side: Secondary | ICD-10-CM | POA: Diagnosis not present

## 2022-09-24 DIAGNOSIS — N401 Enlarged prostate with lower urinary tract symptoms: Secondary | ICD-10-CM | POA: Diagnosis not present

## 2022-09-24 DIAGNOSIS — F32A Depression, unspecified: Secondary | ICD-10-CM | POA: Diagnosis not present

## 2022-09-24 DIAGNOSIS — E785 Hyperlipidemia, unspecified: Secondary | ICD-10-CM | POA: Diagnosis not present

## 2022-09-24 DIAGNOSIS — M5442 Lumbago with sciatica, left side: Secondary | ICD-10-CM | POA: Diagnosis not present

## 2022-09-29 DIAGNOSIS — H903 Sensorineural hearing loss, bilateral: Secondary | ICD-10-CM | POA: Diagnosis not present

## 2022-09-29 DIAGNOSIS — H6983 Other specified disorders of Eustachian tube, bilateral: Secondary | ICD-10-CM | POA: Diagnosis not present

## 2022-10-01 ENCOUNTER — Ambulatory Visit: Payer: PPO | Admitting: Podiatry

## 2022-10-01 DIAGNOSIS — B351 Tinea unguium: Secondary | ICD-10-CM

## 2022-10-01 DIAGNOSIS — M79675 Pain in left toe(s): Secondary | ICD-10-CM | POA: Diagnosis not present

## 2022-10-01 DIAGNOSIS — M79674 Pain in right toe(s): Secondary | ICD-10-CM

## 2022-10-01 NOTE — Progress Notes (Signed)
   Chief Complaint  Patient presents with   Nail Problem    3 months (around 10/02/2022) for [facility]    SUBJECTIVE Patient presents to office today complaining of elongated, thickened nails that cause pain while ambulating in shoes.  Patient is unable to trim their own nails. Patient is here for further evaluation and treatment.  Past Medical History:  Diagnosis Date   Actinic keratosis    Allergy    Anxiety 06/17/2014   Basal cell carcinoma 06/24/2009   Left nose sup. nasal alar crease.    Basal cell carcinoma 09/11/2012   Left lateral bicep. Focal sclerosis. Excised: 11/01/2012, margins free.   Basal cell carcinoma 02/19/2021   R preauricular - ED&C   Benign prostatic hyperplasia with urinary obstruction 06/17/2014   Benign prostatic hypertrophy without urinary obstruction 06/20/2013   Bladder calculi 04/26/2013   Calculi, ureter 03/29/2013   Calculus of kidney 02/25/2013   Cataract    Clinical depression 06/17/2014   Degenerative disc disease, lumbar    Frank hematuria 06/28/2013   HLD (hyperlipidemia) 06/17/2014   Hx of squamous cell carcinoma    multiple sites   Spondylolysis    Squamous cell carcinoma of skin 09/11/2012   Right lateral antecubital area. SCCis.    Squamous cell carcinoma of skin 03/10/2015   Right temple hair line. SCCis   Squamous cell carcinoma of skin 07/26/2016   Right temple hair line. SCCis, hypertrophic.    Allergies  Allergen Reactions   Ciprofloxacin Shortness Of Breath and Swelling    Tongue thickened   Clarithromycin Shortness Of Breath and Swelling    Tongue thickened   Elemental Sulfur Shortness Of Breath and Swelling    Tongue thicened   Oxycodone Shortness Of Breath and Swelling    Tongue thickened   Penicillins Shortness Of Breath and Swelling    Tongue thickened and knots on bottom of feet     OBJECTIVE General Patient is awake, alert, and oriented x 3 and in no acute distress. Derm Skin is dry and supple bilateral. Negative  open lesions or macerations. Remaining integument unremarkable. Nails are tender, long, thickened and dystrophic with subungual debris, consistent with onychomycosis, 1-5 bilateral. No signs of infection noted. Vasc  DP and PT pedal pulses palpable bilaterally. Temperature gradient within normal limits.  Neuro Epicritic and protective threshold sensation grossly intact bilaterally.  Musculoskeletal Exam No symptomatic pedal deformities noted bilateral. Muscular strength within normal limits.  ASSESSMENT 1.  Pain due to onychomycosis of toenails both  PLAN OF CARE 1. Patient evaluated today.  2. Instructed to maintain good pedal hygiene and foot care.  3. Mechanical debridement of nails 1-5 bilaterally performed using a nail nipper. Filed with dremel without incident.  4. Return to clinic in 3 mos.    Felecia Shelling, DPM [facility]  Dr. Felecia Shelling, DPM    [address] [place], [place] [address]                Office [phone]  Fax [phone]

## 2022-10-08 DIAGNOSIS — I2510 Atherosclerotic heart disease of native coronary artery without angina pectoris: Secondary | ICD-10-CM | POA: Diagnosis not present

## 2022-10-08 DIAGNOSIS — K219 Gastro-esophageal reflux disease without esophagitis: Secondary | ICD-10-CM | POA: Diagnosis not present

## 2022-10-08 DIAGNOSIS — E785 Hyperlipidemia, unspecified: Secondary | ICD-10-CM | POA: Diagnosis not present

## 2022-10-11 DIAGNOSIS — I10 Essential (primary) hypertension: Secondary | ICD-10-CM | POA: Diagnosis not present

## 2022-10-11 DIAGNOSIS — E785 Hyperlipidemia, unspecified: Secondary | ICD-10-CM | POA: Diagnosis not present

## 2022-10-11 DIAGNOSIS — Z01818 Encounter for other preprocedural examination: Secondary | ICD-10-CM | POA: Diagnosis not present

## 2022-10-11 DIAGNOSIS — E782 Mixed hyperlipidemia: Secondary | ICD-10-CM | POA: Diagnosis not present

## 2022-10-11 DIAGNOSIS — K219 Gastro-esophageal reflux disease without esophagitis: Secondary | ICD-10-CM | POA: Diagnosis not present

## 2022-10-11 DIAGNOSIS — I2510 Atherosclerotic heart disease of native coronary artery without angina pectoris: Secondary | ICD-10-CM | POA: Diagnosis not present

## 2022-10-11 DIAGNOSIS — Z955 Presence of coronary angioplasty implant and graft: Secondary | ICD-10-CM | POA: Diagnosis not present

## 2022-10-11 DIAGNOSIS — I214 Non-ST elevation (NSTEMI) myocardial infarction: Secondary | ICD-10-CM | POA: Diagnosis not present

## 2022-10-12 DIAGNOSIS — M5442 Lumbago with sciatica, left side: Secondary | ICD-10-CM | POA: Diagnosis not present

## 2022-10-12 DIAGNOSIS — M47818 Spondylosis without myelopathy or radiculopathy, sacral and sacrococcygeal region: Secondary | ICD-10-CM | POA: Diagnosis not present

## 2022-10-12 DIAGNOSIS — G8929 Other chronic pain: Secondary | ICD-10-CM | POA: Diagnosis not present

## 2022-10-12 DIAGNOSIS — M5441 Lumbago with sciatica, right side: Secondary | ICD-10-CM | POA: Diagnosis not present

## 2022-10-28 ENCOUNTER — Encounter: Payer: Self-pay | Admitting: Student in an Organized Health Care Education/Training Program

## 2022-10-28 ENCOUNTER — Ambulatory Visit
Payer: PPO | Attending: Student in an Organized Health Care Education/Training Program | Admitting: Student in an Organized Health Care Education/Training Program

## 2022-10-28 VITALS — BP 139/72 | HR 57 | Temp 97.7°F | Resp 16 | Ht 71.0 in | Wt 160.0 lb

## 2022-10-28 DIAGNOSIS — G894 Chronic pain syndrome: Secondary | ICD-10-CM | POA: Diagnosis not present

## 2022-10-28 DIAGNOSIS — M47816 Spondylosis without myelopathy or radiculopathy, lumbar region: Secondary | ICD-10-CM | POA: Diagnosis not present

## 2022-10-28 DIAGNOSIS — M5136 Other intervertebral disc degeneration, lumbar region with discogenic back pain only: Secondary | ICD-10-CM | POA: Diagnosis not present

## 2022-10-28 NOTE — Patient Instructions (Signed)
GENERAL RISKS AND COMPLICATIONS  What are the risk, side effects and possible complications? Generally speaking, most procedures are safe.  However, with any procedure there are risks, side effects, and the possibility of complications.  The risks and complications are dependent upon the sites that are lesioned, or the type of nerve block to be performed.  The closer the procedure is to the spine, the more serious the risks are.  Great care is taken when placing the radio frequency needles, block needles or lesioning probes, but sometimes complications can occur. Infection: Any time there is an injection through the skin, there is a risk of infection.  This is why sterile conditions are used for these blocks.  There are four possible types of infection. Localized skin infection. Central Nervous System Infection-This can be in the form of Meningitis, which can be deadly. Epidural Infections-This can be in the form of an epidural abscess, which can cause pressure inside of the spine, causing compression of the spinal cord with subsequent paralysis. This would require an emergency surgery to decompress, and there are no guarantees that the patient would recover from the paralysis. Discitis-This is an infection of the intervertebral discs.  It occurs in about 1% of discography procedures.  It is difficult to treat and it may lead to surgery.        2. Pain: the needles have to go through skin and soft tissues, will cause soreness.       3. Damage to internal structures:  The nerves to be lesioned may be near blood vessels or    other nerves which can be potentially damaged.       4. Bleeding: Bleeding is more common if the patient is taking blood thinners such as  aspirin, Coumadin, Ticiid, Plavix, etc., or if he/she have some genetic predisposition  such as hemophilia. Bleeding into the spinal canal can cause compression of the spinal  cord with subsequent paralysis.  This would require an emergency  surgery to  decompress and there are no guarantees that the patient would recover from the  paralysis.       5. Pneumothorax:  Puncturing of a lung is a possibility, every time a needle is introduced in  the area of the chest or upper back.  Pneumothorax refers to free air around the  collapsed lung(s), inside of the thoracic cavity (chest cavity).  Another two possible  complications related to a similar event would include: Hemothorax and Chylothorax.   These are variations of the Pneumothorax, where instead of air around the collapsed  lung(s), you may have blood or chyle, respectively.       6. Spinal headaches: They may occur with any procedures in the area of the spine.       7. Persistent CSF (Cerebro-Spinal Fluid) leakage: This is a rare problem, but may occur  with prolonged intrathecal or epidural catheters either due to the formation of a fistulous  track or a dural tear.       8. Nerve damage: By working so close to the spinal cord, there is always a possibility of  nerve damage, which could be as serious as a permanent spinal cord injury with  paralysis.       9. Death:  Although rare, severe deadly allergic reactions known as "Anaphylactic  reaction" can occur to any of the medications used.      10. Worsening of the symptoms:  We can always make thing worse.  What are the chances   of something like this happening? Chances of any of this occuring are extremely low.  By statistics, you have more of a chance of getting killed in a motor vehicle accident: while driving to the hospital than any of the above occurring .  Nevertheless, you should be aware that they are possibilities.  In general, it is similar to taking a shower.  Everybody knows that you can slip, hit your head and get killed.  Does that mean that you should not shower again?  Nevertheless always keep in mind that statistics do not mean anything if you happen to be on the wrong side of them.  Even if a procedure has a 1 (one) in a  1,000,000 (million) chance of going wrong, it you happen to be that one..Also, keep in mind that by statistics, you have more of a chance of having something go wrong when taking medications.  Who should not have this procedure? If you are on a blood thinning medication (e.g. Coumadin, Plavix, see list of "Blood Thinners"), or if you have an active infection going on, you should not have the procedure.  If you are taking any blood thinners, please inform your physician.  How should I prepare for this procedure? Do not eat or drink anything at least six hours prior to the procedure. Bring a driver with you .  It cannot be a taxi. Come accompanied by an adult that can drive you back, and that is strong enough to help you if your legs get weak or numb from the local anesthetic. Take all of your medicines the morning of the procedure with just enough water to swallow them. If you have diabetes, make sure that you are scheduled to have your procedure done first thing in the morning, whenever possible. If you have diabetes, take only half of your insulin dose and notify our nurse that you have done so as soon as you arrive at the clinic. If you are diabetic, but only take blood sugar pills (oral hypoglycemic), then do not take them on the morning of your procedure.  You may take them after you have had the procedure. Do not take aspirin or any aspirin-containing medications, at least eleven (11) days prior to the procedure.  They may prolong bleeding. Wear loose fitting clothing that may be easy to take off and that you would not mind if it got stained with Betadine or blood. Do not wear any jewelry or perfume Remove any nail coloring.  It will interfere with some of our monitoring equipment.  NOTE: Remember that this is not meant to be interpreted as a complete list of all possible complications.  Unforeseen problems may occur.  BLOOD THINNERS The following drugs contain aspirin or other products,  which can cause increased bleeding during surgery and should not be taken for 2 weeks prior to and 1 week after surgery.  If you should need take something for relief of minor pain, you may take acetaminophen which is found in Tylenol,m Datril, Anacin-3 and Panadol. It is not blood thinner. The products listed below are.  Do not take any of the products listed below in addition to any listed on your instruction sheet.  A.P.C or A.P.C with Codeine Codeine Phosphate Capsules #3 Ibuprofen Ridaura  ABC compound Congesprin Imuran rimadil  Advil Cope Indocin Robaxisal  Alka-Seltzer Effervescent Pain Reliever and Antacid Coricidin or Coricidin-D  Indomethacin Rufen  Alka-Seltzer plus Cold Medicine Cosprin Ketoprofen S-A-C Tablets  Anacin Analgesic Tablets or Capsules Coumadin   Korlgesic Salflex  Anacin Extra Strength Analgesic tablets or capsules CP-2 Tablets Lanoril Salicylate  Anaprox Cuprimine Capsules Levenox Salocol  Anexsia-D Dalteparin Magan Salsalate  Anodynos Darvon compound Magnesium Salicylate Sine-off  Ansaid Dasin Capsules Magsal Sodium Salicylate  Anturane Depen Capsules Marnal Soma  APF Arthritis pain formula Dewitt's Pills Measurin Stanback  Argesic Dia-Gesic Meclofenamic Sulfinpyrazone  Arthritis Bayer Timed Release Aspirin Diclofenac Meclomen Sulindac  Arthritis pain formula Anacin Dicumarol Medipren Supac  Analgesic (Safety coated) Arthralgen Diffunasal Mefanamic Suprofen  Arthritis Strength Bufferin Dihydrocodeine Mepro Compound Suprol  Arthropan liquid Dopirydamole Methcarbomol with Aspirin Synalgos  ASA tablets/Enseals Disalcid Micrainin Tagament  Ascriptin Doan's Midol Talwin  Ascriptin A/D Dolene Mobidin Tanderil  Ascriptin Extra Strength Dolobid Moblgesic Ticlid  Ascriptin with Codeine Doloprin or Doloprin with Codeine Momentum Tolectin  Asperbuf Duoprin Mono-gesic Trendar  Aspergum Duradyne Motrin or Motrin IB Triminicin  Aspirin plain, buffered or enteric coated  Durasal Myochrisine Trigesic  Aspirin Suppositories Easprin Nalfon Trillsate  Aspirin with Codeine Ecotrin Regular or Extra Strength Naprosyn Uracel  Atromid-S Efficin Naproxen Ursinus  Auranofin Capsules Elmiron Neocylate Vanquish  Axotal Emagrin Norgesic Verin  Azathioprine Empirin or Empirin with Codeine Normiflo Vitamin E  Azolid Emprazil Nuprin Voltaren  Bayer Aspirin plain, buffered or children's or timed BC Tablets or powders Encaprin Orgaran Warfarin Sodium  Buff-a-Comp Enoxaparin Orudis Zorpin  Buff-a-Comp with Codeine Equegesic Os-Cal-Gesic   Buffaprin Excedrin plain, buffered or Extra Strength Oxalid   Bufferin Arthritis Strength Feldene Oxphenbutazone   Bufferin plain or Extra Strength Feldene Capsules Oxycodone with Aspirin   Bufferin with Codeine Fenoprofen Fenoprofen Pabalate or Pabalate-SF   Buffets II Flogesic Panagesic   Buffinol plain or Extra Strength Florinal or Florinal with Codeine Panwarfarin   Buf-Tabs Flurbiprofen Penicillamine   Butalbital Compound Four-way cold tablets Penicillin   Butazolidin Fragmin Pepto-Bismol   Carbenicillin Geminisyn Percodan   Carna Arthritis Reliever Geopen Persantine   Carprofen Gold's salt Persistin   Chloramphenicol Goody's Phenylbutazone   Chloromycetin Haltrain Piroxlcam   Clmetidine heparin Plaquenil   Cllnoril Hyco-pap Ponstel   Clofibrate Hydroxy chloroquine Propoxyphen         Before stopping any of these medications, be sure to consult the physician who ordered them.  Some, such as Coumadin (Warfarin) are ordered to prevent or treat serious conditions such as "deep thrombosis", "pumonary embolisms", and other heart problems.  The amount of time that you may need off of the medication may also vary with the medication and the reason for which you were taking it.  If you are taking any of these medications, please make sure you notify your pain physician before you undergo any procedures.         Moderate Conscious  Sedation, Adult Sedation is the use of medicines to help you relax and not feel pain. Moderate conscious sedation is a type of sedation that makes you less alert than normal. You are still able to respond to instructions, touch, or both. This type of sedation is used during short medical and dental procedures. It is milder than deep sedation, which is a type of sedation you cannot be easily woken up from. It is also milder than general anesthesia, which is the use of medicines to make you fall asleep. Moderate conscious sedation lets you return to your normal activities sooner. Tell a health care provider about: Any allergies you have. All medicines you are taking, including vitamins, herbs, steroids, eye drops, creams, and over-the-counter medicines. Any problems you or family members have had with anesthesia.   Any bleeding problems you have. Any surgeries you have had. Any medical conditions you have. Whether you are pregnant or may be pregnant. Any recent alcohol, tobacco, or drug use. What are the risks? Your health care provider will talk with you about risks. These may include: Oversedation. This is when you get too much medicine. Nausea or vomiting. Allergic reaction to medicines. Trouble breathing. If this happens, a breathing tube may be used. It will be removed when you can breathe better on your own. Heart trouble. Lung trouble. Emergence delirium. This is when you feel confused while the sedation wears off. This gets better with time. What happens before the procedure? When to stop eating and drinking Follow instructions from your health care provider about what you may eat and drink. These may include: 8 hours before your procedure Stop eating most foods. Do not eat meat, fried foods, or fatty foods. Eat only light foods, such as toast or crackers. All liquids are okay except energy drinks and alcohol. 6 hours before your procedure Stop eating. Drink only clear liquids, such  as water, clear fruit juice, black coffee, plain tea, and sports drinks. Do not drink energy drinks or alcohol. 2 hours before your procedure Stop drinking all liquids. You may be allowed to take medicines with small sips of water. If you do not follow your health care provider's instructions, your procedure may be delayed or canceled. Medicines Ask your health care provider about: Changing or stopping your regular medicines. These include any diabetes medicines or blood thinners you take. Taking medicines such as aspirin and ibuprofen. These medicines can thin your blood. Do not take them unless your health care provider tells you to. Taking over-the-counter medicines, vitamins, herbs, and supplements. Tests and exams You may have an exam or testing. You may have a blood or urine sample taken. General instructions Do not use any products that contain nicotine or tobacco for at least 4 weeks before the procedure. These products include cigarettes, chewing tobacco, and vaping devices, such as e-cigarettes. If you need help quitting, ask your health care provider. If you will be going home right after the procedure, plan to have a responsible adult: Take you home from the hospital or clinic. You will not be allowed to drive. Care for you for the time you are told. What happens during the procedure?  You will be given the sedative. It may be given: As a pill you can take by mouth. It can also be put into the rectum. As a spray through the nose. As an injection into muscle. As an injection into a vein through an IV. You may be given oxygen as needed. Your blood pressure, heart rate, breathing rate, and blood oxygen level will be monitored during the procedure. The medical or dental procedure will be done. The procedure may vary among health care providers and hospitals. What happens after the procedure? Your blood pressure, heart rate, breathing rate, and blood oxygen level will be  monitored until you leave the hospital or clinic. You will get fluids through an IV as needed. Do not drive or operate machinery until your health care provider says that it is safe. This information is not intended to replace advice given to you by your health care provider. Make sure you discuss any questions you have with your health care provider. Document Revised: 07/27/2021 Document Reviewed: 07/27/2021 Else[employer] Patient Education  2024 [employer] Radiofrequency Ablation Radiofrequency ablation is a procedure that is performed to relieve pain. The   procedure is often used for back, neck, or arm pain. Radiofrequency ablation involves the use of a machine that creates radio waves to make heat. During the procedure, the heat is applied to the nerve that carries the pain signal. The heat damages the nerve and interferes with the pain signal. Pain relief usually starts about 2 weeks after the procedure and lasts for 6 months to 1 year. Tell a health care provider about: Any allergies you have. All medicines you are taking, including vitamins, herbs, eye drops, creams, and over-the-counter medicines. Any problems you or family members have had with anesthetic medicines. Any bleeding problems you have. Any surgeries you have had. Any medical conditions you have. Whether you are pregnant or may be pregnant. What are the risks? Generally, this is a safe procedure. However, problems may occur, including: Pain or soreness at the injection site. Allergic reaction to medicines given during the procedure. Bleeding. Infection at the injection site. Damage to nerves or blood vessels. What happens before the procedure? When to stop eating and drinking Follow instructions from your health care provider about what you may eat and drink before your procedure. These may include: 8 hours before the procedure Stop eating most foods. Do not eat meat, fried foods, or fatty foods. Eat only light foods,  such as toast or crackers. All liquids are okay except energy drinks and alcohol. 6 hours before the procedure Stop eating. Drink only clear liquids, such as water, clear fruit juice, black coffee, plain tea, and sports drinks. Do not drink energy drinks or alcohol. 2 hours before the procedure Stop drinking all liquids. You may be allowed to take medicine with small sips of water. If you do not follow your health care provider's instructions, your procedure may be delayed or canceled. Medicines Ask your health care provider about: Changing or stopping your regular medicines. This is especially important if you are taking diabetes medicines or blood thinners. Taking medicines such as aspirin and ibuprofen. These medicines can thin your blood. Do not take these medicines unless your health care provider tells you to take them. Taking over-the-counter medicines, vitamins, herbs, and supplements. General instructions Ask your health care provider what steps will be taken to help prevent infection. These steps may include: Removing hair at the procedure site. Washing skin with a germ-killing soap. Taking antibiotic medicine. If you will be going home right after the procedure, plan to have a responsible adult: Take you home from the hospital or clinic. You will not be allowed to drive. Care for you for the time you are told. What happens during the procedure?  You will be awake during the procedure. You will need to be able to talk with the health care provider during the procedure. An IV will be inserted into one of your veins. You will be given one or more of the following: A medicine to help you relax (sedative). A medicine to numb the area (local anesthetic). Your health care provider will insert a radiofrequency needle into the area to be treated. This is done with the help of fluoroscopy. A wire that carries the radio waves (electrode) will be put through the radiofrequency  needle. An electrical pulse will be sent through the electrode to verify the correct nerve that is causing your pain. You will feel a tingling sensation, and you may have muscle twitching. The tissue around the needle tip will be heated by an electric current that comes from the radiofrequency machine. This will numb the nerves.   The needle will be removed. A bandage (dressing) will be put on the insertion area. The procedure may vary among health care providers and hospitals. What happens after the procedure? Your blood pressure, heart rate, breathing rate, and blood oxygen level will be monitored until you leave the hospital or clinic. Return to your normal activities as told by your health care provider. Ask your health care provider what activities are safe for you. If you were given a sedative during the procedure, it can affect you for several hours. Do not drive or operate machinery until your health care provider says that it is safe. Summary Radiofrequency ablation is a procedure that is performed to relieve pain. The procedure is often used for back, neck, or arm pain. Radiofrequency ablation involves the use of a machine that creates radio waves to make heat. Plan to have a responsible adult take you home from the hospital or clinic. Do not drive or operate machinery until your health care provider says that it is safe. Return to your normal activities as told by your health care provider. Ask your health care provider what activities are safe for you. This information is not intended to replace advice given to you by your health care provider. Make sure you discuss any questions you have with your health care provider. Document Revised: 07/01/2020 Document Reviewed: 07/01/2020 Else[employer] Patient Education  2024 [employer]  
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
## 2022-10-28 NOTE — Progress Notes (Signed)
Safety precautions to be maintained throughout the outpatient stay will include: orient to surroundings, keep bed in low position, maintain call bell within reach at all times, provide assistance with transfer out of bed and ambulation.  

## 2022-11-09 DIAGNOSIS — H6981 Other specified disorders of Eustachian tube, right ear: Secondary | ICD-10-CM | POA: Diagnosis not present

## 2022-11-09 DIAGNOSIS — H903 Sensorineural hearing loss, bilateral: Secondary | ICD-10-CM | POA: Diagnosis not present

## 2022-11-16 DIAGNOSIS — H18421 Band keratopathy, right eye: Secondary | ICD-10-CM | POA: Diagnosis not present

## 2022-11-16 DIAGNOSIS — H02105 Unspecified ectropion of left lower eyelid: Secondary | ICD-10-CM | POA: Diagnosis not present

## 2022-11-16 DIAGNOSIS — H04123 Dry eye syndrome of bilateral lacrimal glands: Secondary | ICD-10-CM | POA: Diagnosis not present

## 2022-11-16 DIAGNOSIS — Z961 Presence of intraocular lens: Secondary | ICD-10-CM | POA: Diagnosis not present

## 2022-11-17 ENCOUNTER — Encounter: Payer: Self-pay | Admitting: Student in an Organized Health Care Education/Training Program

## 2022-11-17 ENCOUNTER — Ambulatory Visit
Admission: RE | Admit: 2022-11-17 | Discharge: 2022-11-17 | Disposition: A | Discharge status: Home / Self Care | Payer: PPO | Source: Ambulatory Visit | Attending: Student in an Organized Health Care Education/Training Program | Admitting: Student in an Organized Health Care Education/Training Program

## 2022-11-17 ENCOUNTER — Ambulatory Visit
Payer: PPO | Attending: Student in an Organized Health Care Education/Training Program | Admitting: Student in an Organized Health Care Education/Training Program

## 2022-11-17 VITALS — BP 155/63 | HR 51 | Temp 97.9°F | Resp 20 | Ht 72.0 in | Wt 160.0 lb

## 2022-11-17 DIAGNOSIS — M1712 Unilateral primary osteoarthritis, left knee: Secondary | ICD-10-CM | POA: Diagnosis not present

## 2022-11-17 DIAGNOSIS — M47816 Spondylosis without myelopathy or radiculopathy, lumbar region: Secondary | ICD-10-CM | POA: Diagnosis not present

## 2022-11-17 DIAGNOSIS — M25562 Pain in left knee: Secondary | ICD-10-CM | POA: Insufficient documentation

## 2022-11-17 MED ORDER — LACTATED RINGERS IV SOLN: Freq: Once | INTRAVENOUS | Status: AC

## 2022-11-17 MED ORDER — LIDOCAINE HCL 2 % IJ SOLN
20.0000 mL | Freq: Once | INTRAMUSCULAR | Status: AC
  Administered 2022-11-17: 400 mg
  Filled 2022-11-17: qty 20

## 2022-11-17 MED ORDER — ROPIVACAINE HCL 2 MG/ML IJ SOLN
9.0000 mL | Freq: Once | INTRAMUSCULAR | Status: AC
  Administered 2022-11-17: 18 mL via PERINEURAL

## 2022-11-17 MED ORDER — MIDAZOLAM HCL 2 MG/2ML IJ SOLN
0.5000 mg | Freq: Once | INTRAMUSCULAR | Status: AC
  Administered 2022-11-17: 1 mg via INTRAVENOUS
  Filled 2022-11-17: qty 2

## 2022-11-17 MED ORDER — DEXAMETHASONE SODIUM PHOSPHATE 10 MG/ML IJ SOLN
10.0000 mg | Freq: Once | INTRAMUSCULAR | Status: AC
  Administered 2022-11-17: 10 mg
  Filled 2022-11-17: qty 1

## 2022-11-17 MED ORDER — ROPIVACAINE HCL 2 MG/ML IJ SOLN
9.0000 mL | Freq: Once | INTRAMUSCULAR | Status: AC
  Administered 2022-11-17: 18 mL via PERINEURAL
  Filled 2022-11-17: qty 20

## 2022-11-17 NOTE — Patient Instructions (Signed)

## 2022-11-17 NOTE — Progress Notes (Signed)
Patient's Name: Todd Bennett  MRN: [id number]  Referring Provider: Dorothey Baseman, MD  DOB: 1932-07-24  PCP: Dorothey Baseman, MD  DOS: 11/17/2022  Note by: Edward Jolly, MD  Service setting: Ambulatory outpatient  Specialty: Interventional Pain Management  Patient type: Established  Location: [facility] (AMB) Pain Management Facility  Visit type: Interventional Procedure   Primary Reason for Visit: Interventional Pain Management Treatment. CC: Back Pain (Bilateral lumbar L3,4,5)  Procedure:  Anesthesia, Analgesia, Anxiolysis:  Type: Therapeutic Medial Branch Facet Radiofrequency Ablation #2 (#1 done 2018) Region: Lumbar Level:  L3, L4, L5,  Medial Branch Level(s) Laterality: Bilateral  Type: Local Anesthesia with IV Versed Local Anesthetic: Lidocaine 1% Route: Intravenous (IV) IV Access: Secured Sedation: Meaningful verbal contact was maintained at all times during the procedure  Indication(s): Analgesia and Anxiety   Indications: 1. Lumbar facet arthropathy   2. Lumbar spondylosis   3. Primary osteoarthritis of left knee   4. Left medial knee pain    Mr. Parmeter has either failed to respond, was unable to tolerate, or simply did not get enough benefit from other more conservative therapies including, but not limited to: 1. Over-the-counter medications 2. Anti-inflammatory medications 3. Muscle relaxants 4. Membrane stabilizers 5. Opioids 6. Physical therapy 7. Modalities (Heat, ice, etc.) 8. Invasive techniques such as nerve blocks.   Pain Score: Pre-procedure: 2/10 Post-procedure: 0-No pain/10  Pre-op Assessment:  Mr. Tugman is a 89 y.o. (year old), male patient, seen today for interventional treatment. He  has a past surgical history that includes Ileostomy; Lithotripsy (03/2013); Hernia repair (1993); LEFT HEART CATH AND CORONARY ANGIOGRAPHY (N/A, 03/15/2017); CORONARY STENT INTERVENTION (N/A, 03/15/2017); IR Angiogram Extremity Left (02/10/2018); IR Angiogram Selective Each  Additional Vessel (02/10/2018); IR Angiogram Selective Each Additional Vessel (02/10/2018); IR US Guide Vasc Access Right (02/10/2018); IR EMBO VENOUS NOT HEMORR HEMANG  INC GUIDE ROADMAPPING (02/10/2018); and IR Angiogram Selective Each Additional Vessel (02/10/2018). Mr. Muska has a current medication list which includes the following prescription(s): acetaminophen, vitamin c, aspirin ec, atorvastatin, cholecalciferol, finasteride, fluoxetine, losartan, metoprolol tartrate, metronidazole, mirtazapine, multi-vitamins, nitroglycerin, fish oil, omeprazole, tamsulosin, clopidogrel, and diazepam. His primarily concern today is the Back Pain (Bilateral lumbar L3,4,5)  Initial Vital Signs: Blood pressure (!) 148/89, pulse 68, temperature 97.6 F (36.4 C), resp. rate 19, height 6' (1.829 m), weight 165 lb (74.8 kg), SpO2 91 %. BMI: Estimated body mass index is 21.7 kg/m as calculated from the following:   Height as of this encounter: 6' (1.829 m).   Weight as of this encounter: 160 lb (72.6 kg).  Risk Assessment: Allergies: Reviewed. He is allergic to ciprofloxacin, clarithromycin, elemental sulfur, oxycodone, and penicillins.  Allergy Precautions: None required Coagulopathies: Reviewed. None identified.  Blood-thinner therapy: None at this time Active Infection(s): Reviewed. None identified. Mr. Poole is afebrile  Site Confirmation: Mr. Crumity was asked to confirm the procedure and laterality before marking the site Procedure checklist: Completed Consent: Before the procedure and under the influence of no sedative(s), amnesic(s), or anxiolytics, the patient was informed of the treatment options, risks and possible complications. To fulfill our ethical and legal obligations, as recommended by the American Medical Association's Code of Ethics, I have informed the patient of my clinical impression; the nature and purpose of the treatment or procedure; the risks, benefits, and possible complications of the  intervention; the alternatives, including doing nothing; the risk(s) and benefit(s) of the alternative treatment(s) or procedure(s); and the risk(s) and benefit(s) of doing nothing. The patient was provided information about the general  risks and possible complications associated with the procedure. These may include, but are not limited to: failure to achieve desired goals, infection, bleeding, organ or nerve damage, allergic reactions, paralysis, and death. In addition, the patient was informed of those risks and complications associated to Spine-related procedures, such as failure to decrease pain; infection (i.e.: Meningitis, epidural or intraspinal abscess); bleeding (i.e.: epidural hematoma, subarachnoid hemorrhage, or any other type of intraspinal or peri-dural bleeding); organ or nerve damage (i.e.: Any type of peripheral nerve, nerve root, or spinal cord injury) with subsequent damage to sensory, motor, and/or autonomic systems, resulting in permanent pain, numbness, and/or weakness of one or several areas of the body; allergic reactions; (i.e.: anaphylactic reaction); and/or death. Furthermore, the patient was informed of those risks and complications associated with the medications. These include, but are not limited to: allergic reactions (i.e.: anaphylactic or anaphylactoid reaction(s)); adrenal axis suppression; blood sugar elevation that in diabetics may result in ketoacidosis or comma; water retention that in patients with history of congestive heart failure may result in shortness of breath, pulmonary edema, and decompensation with resultant heart failure; weight gain; swelling or edema; medication-induced neural toxicity; particulate matter embolism and blood vessel occlusion with resultant organ, and/or nervous system infarction; and/or aseptic necrosis of one or more joints. Finally, the patient was informed that Medicine is not an exact science; therefore, there is also the possibility of  unforeseen or unpredictable risks and/or possible complications that may result in a catastrophic outcome. The patient indicated having understood very clearly. We have given the patient no guarantees and we have made no promises. Enough time was given to the patient to ask questions, all of which were answered to the patient's satisfaction. Mr. Eymard has indicated that he wanted to continue with the procedure. Attestation: I, the ordering provider, attest that I have discussed with the patient the benefits, risks, side-effects, alternatives, likelihood of achieving goals, and potential problems during recovery for the procedure that I have provided informed consent. Date: 11/17/2022; Time: 9:12 AM  Pre-Procedure Preparation:  Monitoring: As per clinic protocol. Respiration, ETCO2, SpO2, BP, heart rate and rhythm monitor placed and checked for adequate function Safety Precautions: Patient was assessed for positional comfort and pressure points before starting the procedure. Time-out: I initiated and conducted the "Time-out" before starting the procedure, as per protocol. The patient was asked to participate by confirming the accuracy of the "Time Out" information. Verification of the correct person, site, and procedure were performed and confirmed by me, the nursing staff, and the patient. "Time-out" conducted as per Joint Commission's Universal Protocol (UP.01.01.01). "Time-out" Date & Time: 11/17/2022; 1041 hrs.  Description of Procedure Process:   Position: Prone Target Area: For Lumbar Facet blocks, the target is the groove formed by the junction of the transverse process and superior articular process. For the L5 dorsal ramus, the target is the notch between superior articular process and sacral ala. BILATERAL.   Approach: Paraspinal approach. Area Prepped: Entire Posterior Lumbosacral Region Prepping solution: Hibiclens (4.0% Chlorhexidine gluconate solution) Safety Precautions: Aspiration  looking for blood return was conducted prior to all injections. At no point did we inject any substances, as a needle was being advanced. No attempts were made at seeking any paresthesias. Safe injection practices and needle disposal techniques used. Medications properly checked for expiration dates. SDV (single dose vial) medications used. Description of the Procedure: Protocol guidelines were followed. The patient was placed in position over the fluoroscopy table. The target area was identified and the area  prepped in the usual manner. Skin & deeper tissues infiltrated with local anesthetic. Appropriate amount of time allowed to pass for local anesthetics to take effect. Radiofrequency needles were introduced to the area of the medial branch at the junction of the superior articular process and transverse process using fluoroscopy. Using the [employer] Radiofrequency Generator, sensory stimulation using 50 Hz was used to locate & identify the nerve, making sure that the needle was positioned such that there was no sensory stimulation below 0.3 V or above 0.7 V. Stimulation using 2 Hz was used to evaluate the motor component. Care was taken not to lesion any nerves that demonstrated motor stimulation of the lower extremities at an output of less than 2.5 times that of the sensory threshold, or a maximum of 2.0 V. Once satisfactory placement of the needles was achieved, the above solution was slowly injected after negative aspiration. After waiting for at least 2 minutes, the ablation was performed at 80 degrees C for 90 seconds.The needles were then removed and the area cleansed, making sure to leave some of the prepping solution back to take advantage of its long term bactericidal properties.This was done bilaterally. Intra-operative Compliance: Compliant  Illustration of the posterior view of the lumbar spine and the posterior neural structures. Laminae of L2 through S1 are labeled. DPRL5, dorsal primary ramus  of L5; DPRS1, dorsal primary ramus of S1; DPR3, dorsal primary ramus of L3; FJ, facet (zygapophyseal) joint L3-L4; I, inferior articular process of L4; LB1, lateral branch of dorsal primary ramus of L1; IAB, inferior articular branches from L3 medial branch (supplies L4-L5 facet joint); IBP, intermediate branch plexus; MB3, medial branch of dorsal primary ramus of L3; NR3, third lumbar nerve root; S, superior articular process of L5; SAB, superior articular branches from L4 (supplies L4-5 facet joint also); TP3, transverse process of L3.  Vitals:   11/17/22 1105 11/17/22 1110 11/17/22 1113 11/17/22 1124  BP: (!) 160/88 (!) 170/86 (!) 173/90 (!) 155/63  Pulse:      Resp: 20 20 (!) 21 20  Temp:      TempSrc:      SpO2: 97% 99% 98% 98%  Weight:      Height:        Start Time: 1042 hrs. End Time: 1110 hrs. Materials & Medications:  Needle(s) Type: Teflon-coated, curved tip, Radiofrequency needle(s) Gauge: 22G Length: 10cm Medication(s): We administered lidocaine, lactated ringers, midazolam, dexamethasone, dexamethasone, ropivacaine (PF) 2 mg/mL (0.2%), and ropivacaine (PF) 2 mg/mL (0.2%). Please see chart orders for dosing details.  Imaging Guidance (Spinal):  Type of Imaging Technique: Fluoroscopy Guidance (Spinal) Indication(s): Assistance in needle guidance and placement for procedures requiring needle placement in or near specific anatomical locations not easily accessible without such assistance. Exposure Time: Please see nurses notes. Contrast: None used. Fluoroscopic Guidance: I was personally present during the use of fluoroscopy. "Tunnel Vision Technique" used to obtain the best possible view of the target area. Parallax error corrected before commencing the procedure. "Direction-depth-direction" technique used to introduce the needle under continuous pulsed fluoroscopy. Once target was reached, antero-posterior, oblique, and lateral fluoroscopic projection used confirm needle  placement in all planes. Images permanently stored in EMR. Interpretation: No contrast injected. I personally interpreted the imaging intraoperatively. Adequate needle placement confirmed in multiple planes. Permanent images saved into the patient's record.  Antibiotic Prophylaxis:  Indication(s): None identified Antibiotic given: None  Post-operative Assessment:  EBL: None Complications: No immediate post-treatment complications observed by team, or reported by patient. Note: The patient  tolerated the entire procedure well. A repeat set of vitals were taken after the procedure and the patient was kept under observation following institutional policy, for this type of procedure. Post-procedural neurological assessment was performed, showing return to baseline, prior to discharge. The patient was provided with post-procedure discharge instructions, including a section on how to identify potential problems. Should any problems arise concerning this procedure, the patient was given instructions to immediately contact us, at any time, without hesitation. In any case, we plan to contact the patient by telephone for a follow-up status report regarding this interventional procedure. Comments:  No additional relevant information. 5 out of 5 strength bilateral lower extremity: Plantar flexion, dorsiflexion, knee flexion, knee extension. Plan of Care  Imaging Orders         DG PAIN CLINIC C-ARM 1-60 MIN NO REPORT    Procedure Orders         KNEE INJECTION     Patient experiencing increased left KNEE pain worse with weightbearing Left knee OA- plan for left knee IA steroid injection  Medications ordered for procedure: Meds ordered this encounter  Medications   lidocaine (XYLOCAINE) 2 % (with pres) injection 400 mg   lactated ringers infusion   midazolam (VERSED) injection 0.5-2 mg    Make sure Flumazenil is available in the pyxis when using this medication. If oversedation occurs, administer 0.2 mg  IV over 15 sec. If after 45 sec no response, administer 0.2 mg again over 1 min; may repeat at 1 min intervals; not to exceed 4 doses (1 mg)   dexamethasone (DECADRON) injection 10 mg   dexamethasone (DECADRON) injection 10 mg   ropivacaine (PF) 2 mg/mL (0.2%) (NAROPIN) injection 9 mL   ropivacaine (PF) 2 mg/mL (0.2%) (NAROPIN) injection 9 mL   Medications administered: We administered lidocaine, lactated ringers, midazolam, dexamethasone, dexamethasone, ropivacaine (PF) 2 mg/mL (0.2%), and ropivacaine (PF) 2 mg/mL (0.2%).  See the medical record for exact dosing, route, and time of administration.  New Prescriptions   No medications on file   Disposition: Discharge home  Discharge Date & Time: 11/17/2022; 1129 hrs.   Physician-requested Follow-up: Return in about 3 weeks (around 12/08/2022) for Left knee IA steroid injection + PPE post RFA. Future Appointments  Date Time Provider Department Center  12/01/2022 10:00 AM Deirdre Evener, MD [facility] None  12/08/2022  1:20 PM Edward Jolly, MD [facility] None  12/31/2022  9:15 AM Felecia Shelling, DPM [email or web address]  05/11/2023 11:15 AM Vanna Scotland, MD [facility] None   Primary Care Physician: Dorothey Baseman, MD Location: [facility] Note by: Edward Jolly, MD Date: 11/17/2022; Time: 11:57 AM  Disclaimer:  Medicine is not an exact science. The only guarantee in medicine is that nothing is guaranteed. It is important to note that the decision to proceed with this intervention was based on the information collected from the patient. The Data and conclusions were drawn from the patient's questionnaire, the interview, and the physical examination. Because the information was provided in large part by the patient, it cannot be guaranteed that it has not been purposely or unconsciously manipulated. Every effort has been made to obtain as much relevant data as possible for this evaluation. It is important to note  that the conclusions that lead to this procedure are derived in large part from the available data. Always take into account that the treatment will also be dependent on availability of resources and existing treatment guidelines, considered by other Pain Management Practitioners as  being common knowledge and practice, at the time of the intervention. For Medico-Legal purposes, it is also important to point out that variation in procedural techniques and pharmacological choices are the acceptable norm. The indications, contraindications, technique, and results of the above procedure should only be interpreted and judged by a Board-Certified Interventional Pain Specialist with extensive familiarity and expertise in the same exact procedure and technique.

## 2022-11-17 NOTE — Progress Notes (Signed)
Safety precautions to be maintained throughout the outpatient stay will include: orient to surroundings, keep bed in low position, maintain call bell within reach at all times, provide assistance with transfer out of bed and ambulation.  

## 2022-11-18 ENCOUNTER — Telehealth: Payer: Self-pay

## 2022-11-18 NOTE — Telephone Encounter (Signed)
Post procedure follow up. Patient states he is doing much better.

## 2022-12-01 ENCOUNTER — Ambulatory Visit: Payer: PPO | Admitting: Dermatology

## 2022-12-08 ENCOUNTER — Encounter: Payer: Self-pay | Admitting: Student in an Organized Health Care Education/Training Program

## 2022-12-08 ENCOUNTER — Ambulatory Visit
Payer: PPO | Attending: Student in an Organized Health Care Education/Training Program | Admitting: Student in an Organized Health Care Education/Training Program

## 2022-12-08 VITALS — BP 130/60 | HR 53 | Temp 97.2°F | Resp 16 | Ht 72.0 in | Wt 160.0 lb

## 2022-12-08 DIAGNOSIS — M1712 Unilateral primary osteoarthritis, left knee: Secondary | ICD-10-CM | POA: Diagnosis not present

## 2022-12-08 DIAGNOSIS — M25562 Pain in left knee: Secondary | ICD-10-CM | POA: Insufficient documentation

## 2022-12-08 MED ORDER — METHYLPREDNISOLONE ACETATE 40 MG/ML IJ SUSP
40.0000 mg | Freq: Once | INTRAMUSCULAR | Status: AC
  Administered 2022-12-08: 40 mg via INTRA_ARTICULAR
  Filled 2022-12-08: qty 1

## 2022-12-08 MED ORDER — ROPIVACAINE HCL 2 MG/ML IJ SOLN
4.0000 mL | Freq: Once | INTRAMUSCULAR | Status: AC
  Administered 2022-12-08: 4 mL via PERINEURAL
  Filled 2022-12-08: qty 20

## 2022-12-08 MED ORDER — LIDOCAINE HCL (PF) 2 % IJ SOLN
5.0000 mL | Freq: Once | INTRAMUSCULAR | Status: AC
  Administered 2022-12-08: 5 mL
  Filled 2022-12-08: qty 5

## 2022-12-08 NOTE — Progress Notes (Signed)
PROVIDER NOTE: Interpretation of information contained herein should be left to medically-trained personnel. Specific patient instructions are provided elsewhere under "Patient Instructions" section of medical record. This document was created in part using STT-dictation technology, any transcriptional errors that may result from this process are unintentional.  Patient: Todd Bennett Type: Established DOB: 1932-06-26 MRN: [id number] PCP: Dorothey Baseman, MD  Service: Procedure DOS: 12/08/2022 Setting: Ambulatory Location: Ambulatory outpatient facility Delivery: Face-to-face Provider: Edward Jolly, MD Specialty: Interventional Pain Management Specialty designation: 09 Location: Outpatient facility Ref. Prov.: Dorothey Baseman, MD       Interventional Therapy   Type:  Steroid Intra-articular Knee Injection #1  Laterality: Left (-LT) Level/approach: Medial Imaging guidance: None required ([id number]) Anesthesia: Local anesthesia (1-2% Lidocaine) DOS: 12/08/2022  Performed by: Edward Jolly, MD  Purpose: Diagnostic/Therapeutic Indications: Knee arthralgia associated to osteoarthritis of the knee 1. Left medial knee pain   2. Primary osteoarthritis of left knee    NAS-11 score:   Pre-procedure: 0-No pain/10   Post-procedure: 0-No pain/10     Pre-Procedure Preparation  Monitoring: As per clinic protocol.  Risk Assessment: Vitals:  EXB:[id number] body mass index is 21.7 kg/m as calculated from the following:   Height as of this encounter: 6' (1.829 m).   Weight as of this encounter: 160 lb (72.6 kg)., Rate:(!) 53 , BP:130/60, Resp:16, Temp:(!) 97.2 F (36.2 C), SpO2:95 %  Allergies: He is allergic to ciprofloxacin, clarithromycin, elemental sulfur, oxycodone, and penicillins.  Precautions: No additional precautions required  Blood-thinner(s): None at this time  Coagulopathies: Reviewed. None identified.   Active Infection(s): Reviewed. None identified. Todd Bennett is afebrile    Location setting: Exam room Position: Sitting w/ knee bent 90 degrees Safety Precautions: Patient was assessed for positional comfort and pressure points before starting the procedure. Prepping solution: DuraPrep (Iodine Povacrylex [0.7% available iodine] and Isopropyl Alcohol, 74% w/w) Prep Area: Entire knee region Approach: percutaneous, just above the tibial plateau, lateral to the infrapatellar tendon. Intended target: Intra-articular knee space Materials: Tray: Block Needle(s): Regular Qty: 1/side Length: 1.5-inch Gauge: 25G (x1) + 22G (x1)  Meds ordered this encounter  Medications   methylPREDNISolone acetate (DEPO-MEDROL) injection 40 mg   lidocaine HCl (PF) (XYLOCAINE) 2 % injection 5 mL   ropivacaine (PF) 2 mg/mL (0.2%) (NAROPIN) injection 4 mL    No orders of the defined types were placed in this encounter.    Time-out: 1418 I initiated and conducted the "Time-out" before starting the procedure, as per protocol. The patient was asked to participate by confirming the accuracy of the "Time Out" information. Verification of the correct person, site, and procedure were performed and confirmed by me, the nursing staff, and the patient. "Time-out" conducted as per Joint Commission's Universal Protocol (UP.01.01.01). Procedure checklist: Completed  H&P (Pre-op  Assessment)  Todd Bennett is a 89 y.o. (year old), male patient, seen today for interventional treatment. He  has a past surgical history that includes Ileostomy; Lithotripsy (03/2013); Hernia repair (1993); LEFT HEART CATH AND CORONARY ANGIOGRAPHY (N/A, 03/15/2017); CORONARY STENT INTERVENTION (N/A, 03/15/2017); IR Angiogram Extremity Left (02/10/2018); IR Angiogram Selective Each Additional Vessel (02/10/2018); IR Angiogram Selective Each Additional Vessel (02/10/2018); IR US Guide Vasc Access Right (02/10/2018); IR EMBO VENOUS NOT HEMORR HEMANG  INC GUIDE ROADMAPPING (02/10/2018); and IR Angiogram Selective Each Additional Vessel  (02/10/2018). Todd Bennett has a current medication list which includes the following prescription(s): acetaminophen, vitamin c, aspirin ec, atorvastatin, celecoxib, cholecalciferol, finasteride, fluoxetine, losartan, metoprolol tartrate, metronidazole, mirtazapine, multi-vitamins, nitroglycerin, fish oil, omeprazole, tamsulosin,  clopidogrel, diazepam, and venlafaxine xr. His primarily concern today is the Back Pain (Lumbar bilateral ) and Knee Pain (Left )  He is allergic to ciprofloxacin, clarithromycin, elemental sulfur, oxycodone, and penicillins.   Last encounter: My last encounter with him was on 11/17/2022. Pertinent problems: Todd Bennett does not have any pertinent problems on file. Pain Assessment: Severity of Chronic pain is reported as a 0-No pain/10. Location: Back Lower, Left, Right/to just below buttocks cheeks. Onset: More than a month ago. Quality: Discomfort, Sore. Timing: Intermittent. Modifying factor(s): RFA has helped.  rest, arthritis tylenol. Vitals:  height is 6' (1.829 m) and weight is 160 lb (72.6 kg). His temporal temperature is 97.2 F (36.2 C) (abnormal). His blood pressure is 130/60 and his pulse is 53 (abnormal). His respiration is 16 and oxygen saturation is 95%.   Reason for encounter: "interventional pain management therapy due pain of at least four (4) weeks in duration, with failure to respond and/or inability to tolerate more conservative care.  Site Confirmation: Todd Bennett was asked to confirm the procedure and laterality before marking the site.  Consent: Before the procedure and under the influence of no sedative(s), amnesic(s), or anxiolytics, the patient was informed of the treatment options, risks and possible complications. To fulfill our ethical and legal obligations, as recommended by the American Medical Association's Code of Ethics, I have informed the patient of my clinical impression; the nature and purpose of the treatment or procedure; the risks, benefits, and  possible complications of the intervention; the alternatives, including doing nothing; the risk(s) and benefit(s) of the alternative treatment(s) or procedure(s); and the risk(s) and benefit(s) of doing nothing. The patient was provided information about the general risks and possible complications associated with the procedure. These may include, but are not limited to: failure to achieve desired goals, infection, bleeding, organ or nerve damage, allergic reactions, paralysis, and death. In addition, the patient was informed of those risks and complications associated to Spine-related procedures, such as failure to decrease pain; infection (i.e.: Meningitis, epidural or intraspinal abscess); bleeding (i.e.: epidural hematoma, subarachnoid hemorrhage, or any other type of intraspinal or peri-dural bleeding); organ or nerve damage (i.e.: Any type of peripheral nerve, nerve root, or spinal cord injury) with subsequent damage to sensory, motor, and/or autonomic systems, resulting in permanent pain, numbness, and/or weakness of one or several areas of the body; allergic reactions; (i.e.: anaphylactic reaction); and/or death. Furthermore, the patient was informed of those risks and complications associated with the medications. These include, but are not limited to: allergic reactions (i.e.: anaphylactic or anaphylactoid reaction(s)); adrenal axis suppression; blood sugar elevation that in diabetics may result in ketoacidosis or comma; water retention that in patients with history of congestive heart failure may result in shortness of breath, pulmonary edema, and decompensation with resultant heart failure; weight gain; swelling or edema; medication-induced neural toxicity; particulate matter embolism and blood vessel occlusion with resultant organ, and/or nervous system infarction; and/or aseptic necrosis of one or more joints. Finally, the patient was informed that Medicine is not an exact science; therefore, there  is also the possibility of unforeseen or unpredictable risks and/or possible complications that may result in a catastrophic outcome. The patient indicated having understood very clearly. We have given the patient no guarantees and we have made no promises. Enough time was given to the patient to ask questions, all of which were answered to the patient's satisfaction. Todd Bennett has indicated that he wanted to continue with the procedure. Attestation: I, the ordering  provider, attest that I have discussed with the patient the benefits, risks, side-effects, alternatives, likelihood of achieving goals, and potential problems during recovery for the procedure that I have provided informed consent.  Date  Time: 12/08/2022  1:07 PM  Description of procedure  Start Time: 1418 hrs  Local Anesthesia: Once the patient was positioned, prepped, and time-out was completed. The target area was identified located. The skin was marked with an approved surgical skin marker. Once marked, the skin (epidermis, dermis, and hypodermis), and deeper tissues (fat, connective tissue and muscle) were infiltrated with a small amount of a short-acting local anesthetic, loaded on a 10cc syringe with a 25G, 1.5-in  Needle. An appropriate amount of time was allowed for local anesthetics to take effect before proceeding to the next step. Local Anesthetic: Lidocaine 1-2% The unused portion of the local anesthetic was discarded in the proper designated containers. Safety Precautions: Aspiration looking for blood return was conducted prior to all injections. At no point did I inject any substances, as a needle was being advanced. Before injecting, the patient was told to immediately notify me if he was experiencing any new onset of "ringing in the ears, or metallic taste in the mouth". No attempts were made at seeking any paresthesias. Safe injection practices and needle disposal techniques used. Medications properly checked for expiration  dates. SDV (single dose vial) medications used. After the completion of the procedure, all disposable equipment used was discarded in the proper designated medical waste containers.  Technical description: Protocol guidelines were followed. After positioning, the target area was identified and prepped in the usual manner. Skin & deeper tissues infiltrated with local anesthetic. Appropriate amount of time allowed to pass for local anesthetics to take effect. Proper needle placement secured. Once satisfactory needle placement was confirmed, I proceeded to inject the desired solution in slow, incremental fashion, intermittently assessing for discomfort or any signs of abnormal or undesired spread of substance. Once completed, the needle was removed and disposed of, as per hospital protocols. The area was cleaned, making sure to leave some of the prepping solution back to take advantage of its long term bactericidal properties.  Aspiration:  Negative        Vitals:   12/08/22 1328  BP: 130/60  Pulse: (!) 53  Resp: 16  Temp: (!) 97.2 F (36.2 C)  TempSrc: Temporal  SpO2: 95%  Weight: 160 lb (72.6 kg)  Height: 6' (1.829 m)    End Time: 1420 hrs  Imaging guidance  Imaging-assisted Technique: None required. Indication(s): N/A Exposure Time: N/A Contrast: None Fluoroscopic Guidance: N/A Ultrasound Guidance: N/A Interpretation: N/A  Post-op assessment  Post-procedure Vital Signs:  Pulse/HCG Rate: (!) 53  Temp: (!) 97.2 F (36.2 C) Resp: 16 BP: 130/60 SpO2: 95 %  EBL: None  Complications: No immediate post-treatment complications observed by team, or reported by patient.  Note: The patient tolerated the entire procedure well. A repeat set of vitals were taken after the procedure and the patient was kept under observation following institutional policy, for this type of procedure. Post-procedural neurological assessment was performed, showing return to baseline, prior to  discharge. The patient was provided with post-procedure discharge instructions, including a section on how to identify potential problems. Should any problems arise concerning this procedure, the patient was given instructions to immediately contact us, at any time, without hesitation. In any case, we plan to contact the patient by telephone for a follow-up status report regarding this interventional procedure.  Comments:  No additional  relevant information.  Plan of care   Post-procedure evaluation  Type: Therapeutic Medial Branch Facet Radiofrequency Ablation #2 (#1 done 2018) Region: Lumbar Level:  L3, L4, L5,  Medial Branch Level(s) Laterality: Bilateral Effectiveness:  Initial hour after procedure: 100 %  Subsequent 4-6 hours post-procedure: 100 %  Analgesia past initial 6 hours: 75 % (pain is improved, he continues to have pain at the lower buttocks cheek, just below both sides.  Balance is somewhat improved.)  Ongoing improvement:  Analgesic:  75% Function: Todd Bennett reports improvement in function ROM: Mr. Croes reports improvement in ROM  Medications administered: We administered methylPREDNISolone acetate, lidocaine HCl (PF), and ropivacaine (PF) 2 mg/mL (0.2%).  Follow-up plan:   Return in about 4 weeks (around 01/05/2023) for F2F PPE.     Recent Visits Date Type Provider Dept  11/17/22 Procedure visit Edward Jolly, MD [facility]  10/28/22 Office Visit Edward Jolly, MD [facility]  Showing recent visits within past 90 days and meeting all other requirements Today's Visits Date Type Provider Dept  12/08/22 Office Visit Edward Jolly, MD [facility]  Showing today's visits and meeting all other requirements Future Appointments Date Type Provider Dept  01/11/23 Appointment Edward Jolly, MD [facility]  Showing future appointments within next 90 days and meeting all other requirements   Disposition: Discharge home  Discharge  (Date  Time): 12/08/2022; 1430 hrs.   Primary Care Physician: Dorothey Baseman, MD Location: [facility] Note by: Edward Jolly, MD Date: 12/08/2022; Time: 3:16 PM  DISCLAIMER: Medicine is not an exact science. It has no guarantees or warranties. The decision to proceed with this intervention was based on the information collected from the patient. Conclusions were drawn from the patient's questionnaire, interview, and examination. Because information was provided in large part by the patient, it cannot be guaranteed that it has not been purposely or unconsciously manipulated or altered. Every effort has been made to obtain as much accurate, relevant, available data as possible. Always take into account that the treatment will also be dependent on availability of resources and existing treatment guidelines, considered by other Pain Management Specialists as being common knowledge and practice, at the time of the intervention. It is also important to point out that variation in procedural techniques and pharmacological choices are the acceptable norm. For Medico-Legal review purposes, the indications, contraindications, technique, and results of the these procedures should only be evaluated, judged and interpreted by a Board-Certified Interventional Pain Specialist with extensive familiarity and expertise in the same exact procedure and technique.

## 2022-12-08 NOTE — Patient Instructions (Signed)

## 2022-12-08 NOTE — Progress Notes (Signed)
Safety precautions to be maintained throughout the outpatient stay will include: orient to surroundings, keep bed in low position, maintain call bell within reach at all times, provide assistance with transfer out of bed and ambulation.  

## 2022-12-29 DIAGNOSIS — Z933 Colostomy status: Secondary | ICD-10-CM | POA: Diagnosis not present

## 2022-12-31 ENCOUNTER — Ambulatory Visit: Payer: PPO | Admitting: Podiatry

## 2022-12-31 ENCOUNTER — Encounter: Payer: Self-pay | Admitting: Podiatry

## 2022-12-31 DIAGNOSIS — M79674 Pain in right toe(s): Secondary | ICD-10-CM

## 2022-12-31 DIAGNOSIS — M79675 Pain in left toe(s): Secondary | ICD-10-CM

## 2022-12-31 DIAGNOSIS — B351 Tinea unguium: Secondary | ICD-10-CM | POA: Diagnosis not present

## 2022-12-31 NOTE — Progress Notes (Signed)
   Chief Complaint  Patient presents with   Nail Problem    "Trim my toenails."    SUBJECTIVE Patient presents to office today complaining of elongated, thickened nails that cause pain while ambulating in shoes.  Patient is unable to trim their own nails. Patient is here for further evaluation and treatment.  Past Medical History:  Diagnosis Date   Actinic keratosis    Allergy    Anxiety 06/17/2014   Basal cell carcinoma 06/24/2009   Left nose sup. nasal alar crease.    Basal cell carcinoma 09/11/2012   Left lateral bicep. Focal sclerosis. Excised: 11/01/2012, margins free.   Basal cell carcinoma 02/19/2021   R preauricular - ED&C   Benign prostatic hyperplasia with urinary obstruction 06/17/2014   Benign prostatic hypertrophy without urinary obstruction 06/20/2013   Bladder calculi 04/26/2013   Calculi, ureter 03/29/2013   Calculus of kidney 02/25/2013   Cataract    Clinical depression 06/17/2014   Degenerative disc disease, lumbar    Frank hematuria 06/28/2013   HLD (hyperlipidemia) 06/17/2014   Hx of squamous cell carcinoma    multiple sites   Spondylolysis    Squamous cell carcinoma of skin 09/11/2012   Right lateral antecubital area. SCCis.    Squamous cell carcinoma of skin 03/10/2015   Right temple hair line. SCCis   Squamous cell carcinoma of skin 07/26/2016   Right temple hair line. SCCis, hypertrophic.    Allergies  Allergen Reactions   Ciprofloxacin Shortness Of Breath and Swelling    Tongue thickened   Clarithromycin Shortness Of Breath and Swelling    Tongue thickened   Elemental Sulfur Shortness Of Breath and Swelling    Tongue thicened   Oxycodone Shortness Of Breath and Swelling    Tongue thickened   Penicillins Shortness Of Breath and Swelling    Tongue thickened and knots on bottom of feet     OBJECTIVE General Patient is awake, alert, and oriented x 3 and in no acute distress. Derm Skin is dry and supple bilateral. Negative open lesions or  macerations. Remaining integument unremarkable. Nails are tender, long, thickened and dystrophic with subungual debris, consistent with onychomycosis, 1-5 bilateral. No signs of infection noted. Vasc  DP and PT pedal pulses palpable bilaterally. Temperature gradient within normal limits.  Neuro Epicritic and protective threshold sensation grossly intact bilaterally.  Musculoskeletal Exam No symptomatic pedal deformities noted bilateral. Muscular strength within normal limits.  ASSESSMENT 1.  Pain due to onychomycosis of toenails both  PLAN OF CARE 1. Patient evaluated today.  2. Instructed to maintain good pedal hygiene and foot care.  3. Mechanical debridement of nails 1-5 bilaterally performed using a nail nipper. Filed with dremel without incident.  4. Return to clinic in 3 mos.    Edrick Kins, DPM [facility]  Dr. Edrick Kins, DPM    [address], [place] [address]                Office [phone]  Fax [phone]

## 2023-01-11 ENCOUNTER — Ambulatory Visit
Payer: PPO | Attending: Student in an Organized Health Care Education/Training Program | Admitting: Student in an Organized Health Care Education/Training Program

## 2023-01-11 ENCOUNTER — Encounter: Payer: Self-pay | Admitting: Student in an Organized Health Care Education/Training Program

## 2023-01-11 VITALS — BP 119/69 | HR 60 | Temp 97.9°F | Resp 16 | Ht 72.0 in | Wt 160.0 lb

## 2023-01-11 DIAGNOSIS — M48062 Spinal stenosis, lumbar region with neurogenic claudication: Secondary | ICD-10-CM | POA: Insufficient documentation

## 2023-01-11 DIAGNOSIS — G894 Chronic pain syndrome: Secondary | ICD-10-CM | POA: Insufficient documentation

## 2023-01-11 DIAGNOSIS — M47816 Spondylosis without myelopathy or radiculopathy, lumbar region: Secondary | ICD-10-CM | POA: Insufficient documentation

## 2023-01-11 DIAGNOSIS — M25562 Pain in left knee: Secondary | ICD-10-CM | POA: Insufficient documentation

## 2023-01-11 NOTE — Progress Notes (Signed)
PROVIDER NOTE: Information contained herein reflects review and annotations entered in association with encounter. Interpretation of such information and data should be left to medically-trained personnel. Information provided to patient can be located elsewhere in the medical record under "Patient Instructions". Document created using STT-dictation technology, any transcriptional errors that may result from process are unintentional.    Patient: Todd Bennett  Service Category: E/M  Provider: Edward Jolly, MD  DOB: 1932-12-26  DOS: 01/11/2023  Referring Provider: Dorothey Baseman, MD  MRN: [id number]  Specialty: Interventional Pain Management  PCP: Dorothey Baseman, MD  Type: Established Patient  Setting: Ambulatory outpatient    Location: Office  Delivery: Face-to-face     HPI  Todd Bennett, a 89 y.o. year old male, is here today because of his Spinal stenosis, lumbar region, with neurogenic claudication [M48.062]. Todd Bennett primary complain today is Knee Pain (left) and Back Pain (Lower left)  Pain Assessment: Severity of   is reported as a  /10. Location: Knee Left/ . Onset:  . Quality:  . Timing:  . Modifying factor(s):  Marland Kitchen Vitals:  height is 6' (1.829 m) and weight is 160 lb (72.6 kg). His temperature is 97.9 F (36.6 C). His blood pressure is 119/69 and his pulse is 60. His respiration is 16 and oxygen saturation is 95%.  BMI: Estimated body mass index is 21.7 kg/m as calculated from the following:   Height as of this encounter: 6' (1.829 m).   Weight as of this encounter: 160 lb (72.6 kg). Last encounter: 12/08/2022. Last procedure: 11/17/2022.  Reason for encounter: post-procedure evaluation and assessment.   Discussed the use of AI scribe software for clinical note transcription with the patient, who gave verbal consent to proceed.  History of Present Illness   Todd Bennett, a patient with a history of chronic knee pain and lower back pain, reports significant improvement in the knee  pain following a recent injection. The patient no longer experiences any discomfort in the knee. However, he has been experiencing increased pain in the left lower back. The pain is localized and does not radiate to the buttock area. The pain is exacerbated by prolonged standing or walking, such as during grocery shopping or standing at the sink, and is relieved by sitting down. The patient has not had any epidural injections for this pain in the past.       ROS  Constitutional: Denies any fever or chills Gastrointestinal: No reported hemesis, hematochezia, vomiting, or acute GI distress Musculoskeletal:  low back pain  Neurological:  leg pain with prolonged standing/walking  Medication Review  Cholecalciferol, FLUoxetine, Fish Oil, Multi-Vitamins, acetaminophen, aspirin EC, atorvastatin, celecoxib, clopidogrel, diazepam, finasteride, losartan, metoprolol tartrate, metroNIDAZOLE, mirtazapine, nitroGLYCERIN, omeprazole, tamsulosin, venlafaxine XR, and vitamin C  History Review  Allergy: Todd Bennett is allergic to ciprofloxacin, clarithromycin, elemental sulfur, oxycodone, and penicillins. Drug: Todd Bennett  reports no history of drug use. Alcohol:  reports no history of alcohol use. Tobacco:  reports that he has quit smoking. He has never used smokeless tobacco. Social: Todd Bennett  reports that he has quit smoking. He has never used smokeless tobacco. He reports that he does not drink alcohol and does not use drugs. Medical:  has a past medical history of Actinic keratosis, Allergy, Anxiety (06/17/2014), Basal cell carcinoma (06/24/2009), Basal cell carcinoma (09/11/2012), Basal cell carcinoma (02/19/2021), Benign prostatic hyperplasia with urinary obstruction (06/17/2014), Benign prostatic hypertrophy without urinary obstruction (06/20/2013), Bladder calculi (04/26/2013), Calculi, ureter (03/29/2013), Calculus of kidney (02/25/2013), Cataract,  Clinical depression (06/17/2014), Degenerative disc disease,  lumbar, Frank hematuria (06/28/2013), HLD (hyperlipidemia) (06/17/2014), squamous cell carcinoma, Spondylolysis, Squamous cell carcinoma of skin (09/11/2012), Squamous cell carcinoma of skin (03/10/2015), and Squamous cell carcinoma of skin (07/26/2016). Surgical: Todd Bennett  has a past surgical history that includes Ileostomy; Lithotripsy (03/2013); Hernia repair (1993); LEFT HEART CATH AND CORONARY ANGIOGRAPHY (N/A, 03/15/2017); CORONARY STENT INTERVENTION (N/A, 03/15/2017); IR Angiogram Extremity Left (02/10/2018); IR Angiogram Selective Each Additional Vessel (02/10/2018); IR Angiogram Selective Each Additional Vessel (02/10/2018); IR US Guide Vasc Access Right (02/10/2018); IR EMBO VENOUS NOT HEMORR HEMANG  INC GUIDE ROADMAPPING (02/10/2018); and IR Angiogram Selective Each Additional Vessel (02/10/2018). Family: family history includes Diabetes in his mother.  Laboratory Chemistry Profile   Renal Lab Results  Component Value Date   BUN 20 09/03/2021   CREATININE 0.90 09/03/2021   BCR 17 10/13/2017   GFRAA >60 02/12/2018   GFRNONAA >60 09/03/2021    Hepatic Lab Results  Component Value Date   AST 25 03/14/2017   ALT 23 03/14/2017   ALBUMIN 3.9 03/14/2017   ALKPHOS 52 03/14/2017   LIPASE 39 03/14/2017    Electrolytes Lab Results  Component Value Date   NA 139 09/03/2021   K 4.0 09/03/2021   CL 109 09/03/2021   CALCIUM 9.1 09/03/2021    Bone No results found for: "VD25OH", "VD125OH2TOT", "UJ8119JY7", "WG9562ZH0", "25OHVITD1", "25OHVITD2", "25OHVITD3", "TESTOFREE", "TESTOSTERONE"  Inflammation (CRP: Acute Phase) (ESR: Chronic Phase) No results found for: "CRP", "ESRSEDRATE", "LATICACIDVEN"       Note: Above Lab results reviewed.   CLINICAL DATA:  Low back pain.  Bilateral leg pain.   EXAM: MRI LUMBAR SPINE WITHOUT CONTRAST   TECHNIQUE: Multiplanar, multisequence MR imaging of the lumbar spine was performed. No intravenous contrast was administered.   COMPARISON:  None.    FINDINGS: Segmentation:  Standard.   Alignment:  Grade 1 anterolisthesis at L4-5, facet mediated   Vertebrae:  No fracture, evidence of discitis, or bone lesion.   Conus medullaris: Extends to the L1 level and appears normal.   Paraspinal and other soft tissues: Negative   Disc levels:   T12- L1: Unremarkable.   L1-L2: Degenerative disc narrowing and desiccation. Tiny posterior annular fissure is likely. No impingement   L2-L3: Mild facet spurring. Unremarkable disc for age. No impingement   L3-L4: Mild facet spurring. Disc bulging preferentially towards the left foramen. Ventral thecal sac flattening without compressive stenosis.   L4-L5: Advanced facet arthropathy with joint distortion, spurring, ligament thickening, and right-sided effusion. Grade 1 anterolisthesis. The uncovered disc is bulging with an up turning central protrusion. Advanced spinal stenosis. Moderate right foraminal narrowing.   L5-S1:Disc degeneration with moderate narrowing. Far-lateral endplate spurs, greater towards the right. Mild facet spurring. The right L5 nerve is displaced by endplate spurring.   IMPRESSION: 1. L4-5 advanced spinal stenosis primarily due to facet arthropathy with hypertrophy and anterolisthesis. Moderate right foraminal narrowing. 2. L5-S1 right L5 foraminal impingement due to endplate spur. 3. Noncompressive degenerative changes described above.   Physical Exam  General appearance: Well nourished, well developed, and well hydrated. In no apparent acute distress Mental status: Alert, oriented x 3 (person, place, & time)       Respiratory: No evidence of acute respiratory distress Eyes: PERLA Vitals: BP 119/69   Pulse 60   Temp 97.9 F (36.6 C)   Resp 16   Ht 6' (1.829 m)   Wt 160 lb (72.6 kg)   SpO2 95%   BMI 21.70 kg/m  BMI: Estimated body mass index is 21.7 kg/m as calculated from the following:   Height as of this encounter: 6' (1.829 m).   Weight as of  this encounter: 160 lb (72.6 kg). Ideal: Ideal body weight: 77.6 kg (171 lb 1.2 oz)  Lumbar Spine Area Exam  Skin & Axial Inspection: No masses, redness, or swelling Alignment: Symmetrical Functional ROM: Pain restricted ROM affecting both sides Stability: No instability detected Muscle Tone/Strength: Functionally intact. No obvious neuro-muscular anomalies detected. Sensory (Neurological): Neurogenic pain pattern  Gait & Posture Assessment  Ambulation: Patient came in today in a wheel chair Gait: Limited. Using assistive device to ambulate Posture: Difficulty standing up straight, due to pain  Lower Extremity Exam    Side: Right lower extremity  Side: Left lower extremity  Stability: No instability observed          Stability: No instability observed          Skin & Extremity Inspection: Skin color, temperature, and hair growth are WNL. No peripheral edema or cyanosis. No masses, redness, swelling, asymmetry, or associated skin lesions. No contractures.  Skin & Extremity Inspection: Skin color, temperature, and hair growth are WNL. No peripheral edema or cyanosis. No masses, redness, swelling, asymmetry, or associated skin lesions. No contractures.  Functional ROM: Unrestricted ROM                  Functional ROM: Unrestricted ROM                  Muscle Tone/Strength: Functionally intact. No obvious neuro-muscular anomalies detected.  Muscle Tone/Strength: Functionally intact. No obvious neuro-muscular anomalies detected.  Sensory (Neurological): Unimpaired        Sensory (Neurological): Unimpaired        DTR: Patellar: deferred today Achilles: deferred today Plantar: deferred today  DTR: Patellar: deferred today Achilles: deferred today Plantar: deferred today  Palpation: No palpable anomalies  Palpation: No palpable anomalies    Assessment   Diagnosis Status  1. Spinal stenosis, lumbar region, with neurogenic claudication   2. Lumbar facet arthropathy   3. Lumbar  spondylosis   4. Left medial knee pain   5. Chronic pain syndrome    Having a Flare-up Controlled Controlled     Plan of Care      Spinal Stenosis Chronic left lower back pain, exacerbated by prolonged standing or walking and relieved by sitting, with L- MRI evidence of severe spinal stenosis at L4-5. We will schedule an epidural steroid injection for February 07, 2023, and provide information on the minimally invasive lumbar decompression (MILD) procedure for further reading. The epidural injection could offer relief for three to six months, and if it provides short-term relief, the MILD procedure may be considered.      Todd Bennett has a current medication list which includes the following long-term medication(s): atorvastatin, fluoxetine, losartan, metoprolol tartrate, mirtazapine, nitroglycerin, and omeprazole.  Pharmacotherapy (Medications Ordered): No orders of the defined types were placed in this encounter.  Orders:  Orders Placed This Encounter  Procedures   Lumbar Epidural Injection    Standing Status:   Future    Expiration Date:   04/11/2023    Scheduling Instructions:     Procedure: Interlaminar Lumbar Epidural Steroid injection (LESI)            Laterality: Midline     Sedation: Patient's choice.     Timeframe: ASAA    Where will this procedure be performed?:   [facility]  Follow-up plan:   Return in about 27 days (around 02/07/2023) for L4-5 ESI, in clinic [facility].      Recent Visits Date Type Provider Dept  12/08/22 Office Visit Edward Jolly, MD [facility]  11/17/22 Procedure visit Edward Jolly, MD [facility]  10/28/22 Office Visit Edward Jolly, MD [facility]  Showing recent visits within past 90 days and meeting all other requirements Today's Visits Date Type Provider Dept  01/11/23 Office Visit Edward Jolly, MD [facility]  Showing today's visits and meeting all other requirements Future  Appointments Date Type Provider Dept  02/07/23 Appointment Edward Jolly, MD [facility]  Showing future appointments within next 90 days and meeting all other requirements  I discussed the assessment and treatment plan with the patient. The patient was provided an opportunity to ask questions and all were answered. The patient agreed with the plan and demonstrated an understanding of the instructions.  Patient advised to call back or seek an in-person evaluation if the symptoms or condition worsens.  Duration of encounter: .  Total time on encounter, as per AMA guidelines included both the face-to-face and non-face-to-face time personally spent by the physician and/or other qualified health care professional(s) on the day of the encounter (includes time in activities that require the physician or other qualified health care professional and does not include time in activities normally performed by clinical staff). Physician's time may include the following activities when performed: Preparing to see the patient (e.g., pre-charting review of records, searching for previously ordered imaging, lab work, and nerve conduction tests) Review of prior analgesic pharmacotherapies. Reviewing PMP Interpreting ordered tests (e.g., lab work, imaging, nerve conduction tests) Performing post-procedure evaluations, including interpretation of diagnostic procedures Obtaining and/or reviewing separately obtained history Performing a medically appropriate examination and/or evaluation Counseling and educating the patient/family/caregiver Ordering medications, tests, or procedures Referring and communicating with other health care professionals (when not separately reported) Documenting clinical information in the electronic or other health record Independently interpreting results (not separately reported) and communicating results to the patient/ family/caregiver Care coordination (not  separately reported)  Note by: Edward Jolly, MD Date: 01/11/2023; Time: 3:46 PM

## 2023-01-11 NOTE — Progress Notes (Signed)
Safety precautions to be maintained throughout the outpatient stay will include: orient to surroundings, keep bed in low position, maintain call bell within reach at all times, provide assistance with transfer out of bed and ambulation.  

## 2023-01-11 NOTE — Patient Instructions (Signed)
  ______________________________________________________________________    Procedure instructions  Stop blood-thinners  Do not eat or drink fluids (other than water) for 6 hours before your procedure  No water for 2 hours before your procedure  Take your blood pressure medicine with a sip of water  Arrive 30 minutes before your appointment  If sedation is planned, bring suitable driver. Pennie Banter, Benedetto Goad, & public transportation are NOT APPROVED)  Carefully read the "Preparing for your procedure" detailed instructions  If you have questions call us at [phone]  Procedure appointments are for procedures only. NO medication refills or new problem evaluations.   ______________________________________________________________________

## 2023-02-07 ENCOUNTER — Ambulatory Visit
Payer: PPO | Attending: Student in an Organized Health Care Education/Training Program | Admitting: Student in an Organized Health Care Education/Training Program

## 2023-02-07 ENCOUNTER — Encounter: Payer: Self-pay | Admitting: Student in an Organized Health Care Education/Training Program

## 2023-02-07 ENCOUNTER — Ambulatory Visit
Admission: RE | Admit: 2023-02-07 | Discharge: 2023-02-07 | Disposition: A | Discharge status: Home / Self Care | Payer: PPO | Source: Ambulatory Visit | Attending: Student in an Organized Health Care Education/Training Program | Admitting: Student in an Organized Health Care Education/Training Program

## 2023-02-07 VITALS — BP 130/85 | HR 63 | Temp 97.2°F | Resp 23 | Ht 72.0 in | Wt 160.0 lb

## 2023-02-07 DIAGNOSIS — M48062 Spinal stenosis, lumbar region with neurogenic claudication: Secondary | ICD-10-CM | POA: Insufficient documentation

## 2023-02-07 DIAGNOSIS — M47816 Spondylosis without myelopathy or radiculopathy, lumbar region: Secondary | ICD-10-CM | POA: Insufficient documentation

## 2023-02-07 DIAGNOSIS — G894 Chronic pain syndrome: Secondary | ICD-10-CM | POA: Insufficient documentation

## 2023-02-07 MED ORDER — ROPIVACAINE HCL 2 MG/ML IJ SOLN
INTRAMUSCULAR | Status: AC
  Filled 2023-02-07: qty 20

## 2023-02-07 MED ORDER — IOHEXOL 180 MG/ML  SOLN
10.0000 mL | Freq: Once | INTRAMUSCULAR | Status: AC
  Administered 2023-02-07: 10 mL via EPIDURAL
  Filled 2023-02-07: qty 20

## 2023-02-07 MED ORDER — SODIUM CHLORIDE (PF) 0.9 % IJ SOLN
INTRAMUSCULAR | Status: AC
  Filled 2023-02-07: qty 10

## 2023-02-07 MED ORDER — SODIUM CHLORIDE 0.9% FLUSH
2.0000 mL | Freq: Once | INTRAVENOUS | Status: AC
  Administered 2023-02-07: 2 mL

## 2023-02-07 MED ORDER — DEXAMETHASONE SODIUM PHOSPHATE 10 MG/ML IJ SOLN
10.0000 mg | Freq: Once | INTRAMUSCULAR | Status: AC
  Administered 2023-02-07: 10 mg

## 2023-02-07 MED ORDER — ROPIVACAINE HCL 2 MG/ML IJ SOLN
2.0000 mL | Freq: Once | INTRAMUSCULAR | Status: AC
  Administered 2023-02-07: 2 mL via EPIDURAL

## 2023-02-07 MED ORDER — DEXAMETHASONE SODIUM PHOSPHATE 10 MG/ML IJ SOLN
INTRAMUSCULAR | Status: AC
Start: 2023-02-07 — End: ?
  Filled 2023-02-07: qty 1

## 2023-02-07 MED ORDER — LIDOCAINE HCL (PF) 2 % IJ SOLN
INTRAMUSCULAR | Status: AC
  Filled 2023-02-07: qty 10

## 2023-02-07 MED ORDER — LIDOCAINE HCL 2 % IJ SOLN
20.0000 mL | Freq: Once | INTRAMUSCULAR | Status: AC
  Administered 2023-02-07: 200 mg

## 2023-02-07 NOTE — Progress Notes (Signed)
 PROVIDER NOTE: Interpretation of information contained herein should be left to medically-trained personnel. Specific patient instructions are provided elsewhere under Patient Instructions section of medical record. This document was created in part using STT-dictation technology, any transcriptional errors that may result from this process are unintentional.  Patient: Todd Bennett Type: Established DOB: 07/20/1932 MRN: [id number] PCP: Glover Lenis, MD  Service: Procedure DOS: 02/07/2023 Setting: Ambulatory Location: Ambulatory outpatient facility Delivery: Face-to-face Provider: Wallie Sherry, MD Specialty: Interventional Pain Management Specialty designation: 09 Location: Outpatient facility Ref. Prov.: Glover Lenis, MD       Interventional Therapy   Type: Lumbar epidural steroid injection (LESI) (interlaminar) #1    Laterality: Midline   Level:  L4-5 Level.  Imaging: Fluoroscopic guidance         Anesthesia: Local anesthesia (1-2% Lidocaine ) DOS: 02/07/2023  Performed by: Wallie Sherry, MD  Purpose: Diagnostic/Therapeutic Indications: Lumbar radicular pain of intraspinal etiology of more than 4 weeks that has failed to respond to conservative therapy and is severe enough to impact quality of life or function. 1. Spinal stenosis, lumbar region, with neurogenic claudication   2. Lumbar spondylosis   3. Chronic pain syndrome    NAS-11 Pain score:   Pre-procedure: 9 /10   Post-procedure: 0-No pain/10      Position / Prep / Materials:  Position: Prone w/ head of the table raised (slight reverse trendelenburg) to facilitate breathing.  Prep solution: ChloraPrep (2% chlorhexidine gluconate and 70% isopropyl alcohol) Prep Area: Entire Posterior Lumbar Region from lower scapular tip down to mid buttocks area and from flank to flank. Materials:  Tray: Epidural tray Needle(s):  Type: Epidural needle (Tuohy) Gauge (G):  22 Length: Regular (3.5-in) Qty: 1   H&P (Pre-op  Assessment):  Todd Bennett is a 89 y.o. (year old), male patient, seen today for interventional treatment. He  has a past surgical history that includes Ileostomy; Lithotripsy (03/2013); Hernia repair (1993); LEFT HEART CATH AND CORONARY ANGIOGRAPHY (N/A, 03/15/2017); CORONARY STENT INTERVENTION (N/A, 03/15/2017); IR Angiogram Extremity Left (02/10/2018); IR Angiogram Selective Each Additional Vessel (02/10/2018); IR Angiogram Selective Each Additional Vessel (02/10/2018); IR US  Guide Vasc Access Right (02/10/2018); IR EMBO VENOUS NOT HEMORR HEMANG  INC GUIDE ROADMAPPING (02/10/2018); and IR Angiogram Selective Each Additional Vessel (02/10/2018). Todd Bennett has a current medication list which includes the following prescription(s): acetaminophen , vitamin c , aspirin  ec, atorvastatin , celecoxib , cholecalciferol , clopidogrel , diazepam , finasteride , fluoxetine , losartan , metoprolol  tartrate, metronidazole , mirtazapine , multi-vitamins, nitroglycerin , fish oil, omeprazole, tamsulosin , and venlafaxine  xr. His primarily concern today is the Hip Pain (left)  Initial Vital Signs:  Pulse/HCG Rate: 63ECG Heart Rate: 72 Temp: (!) 97.2 F (36.2 C) Resp: (!) 22 BP: (!) 140/54 SpO2: 97 %  BMI: Estimated body mass index is 21.7 kg/m as calculated from the following:   Height as of this encounter: 6' (1.829 m).   Weight as of this encounter: 160 lb (72.6 kg).  Risk Assessment: Allergies: Reviewed. He is allergic to ciprofloxacin, clarithromycin, elemental sulfur, oxycodone, and penicillins.  Allergy Precautions: None required Coagulopathies: Reviewed. None identified.  Blood-thinner therapy: None at this time Active Infection(s): Reviewed. None identified. Todd Bennett is afebrile  Site Confirmation: Todd Bennett was asked to confirm the procedure and laterality before marking the site Procedure checklist: Completed Consent: Before the procedure and under the influence of no sedative(s), amnesic(s), or anxiolytics, the patient was  informed of the treatment options, risks and possible complications. To fulfill our ethical and legal obligations, as recommended by the American Medical Association's Code of Ethics,  I have informed the patient of my clinical impression; the nature and purpose of the treatment or procedure; the risks, benefits, and possible complications of the intervention; the alternatives, including doing nothing; the risk(s) and benefit(s) of the alternative treatment(s) or procedure(s); and the risk(s) and benefit(s) of doing nothing. The patient was provided information about the general risks and possible complications associated with the procedure. These may include, but are not limited to: failure to achieve desired goals, infection, bleeding, organ or nerve damage, allergic reactions, paralysis, and death. In addition, the patient was informed of those risks and complications associated to Spine-related procedures, such as failure to decrease pain; infection (i.e.: Meningitis, epidural or intraspinal abscess); bleeding (i.e.: epidural hematoma, subarachnoid hemorrhage, or any other type of intraspinal or peri-dural bleeding); organ or nerve damage (i.e.: Any type of peripheral nerve, nerve root, or spinal cord injury) with subsequent damage to sensory, motor, and/or autonomic systems, resulting in permanent pain, numbness, and/or weakness of one or several areas of the body; allergic reactions; (i.e.: anaphylactic reaction); and/or death. Furthermore, the patient was informed of those risks and complications associated with the medications. These include, but are not limited to: allergic reactions (i.e.: anaphylactic or anaphylactoid reaction(s)); adrenal axis suppression; blood sugar elevation that in diabetics may result in ketoacidosis or comma; water retention that in patients with history of congestive heart failure may result in shortness of breath, pulmonary edema, and decompensation with resultant heart  failure; weight gain; swelling or edema; medication-induced neural toxicity; particulate matter embolism and blood vessel occlusion with resultant organ, and/or nervous system infarction; and/or aseptic necrosis of one or more joints. Finally, the patient was informed that Medicine is not an exact science; therefore, there is also the possibility of unforeseen or unpredictable risks and/or possible complications that may result in a catastrophic outcome. The patient indicated having understood very clearly. We have given the patient no guarantees and we have made no promises. Enough time was given to the patient to ask questions, all of which were answered to the patient's satisfaction. Mr. Campion has indicated that he wanted to continue with the procedure. Attestation: I, the ordering provider, attest that I have discussed with the patient the benefits, risks, side-effects, alternatives, likelihood of achieving goals, and potential problems during recovery for the procedure that I have provided informed consent. Date  Time: 02/07/2023 11:32 AM   Pre-Procedure Preparation:  Monitoring: As per clinic protocol. Respiration, ETCO2, SpO2, BP, heart rate and rhythm monitor placed and checked for adequate function Safety Precautions: Patient was assessed for positional comfort and pressure points before starting the procedure. Time-out: I initiated and conducted the Time-out before starting the procedure, as per protocol. The patient was asked to participate by confirming the accuracy of the Time Out information. Verification of the correct person, site, and procedure were performed and confirmed by me, the nursing staff, and the patient. Time-out conducted as per Joint Commission's Universal Protocol (UP.01.01.01). Time: 1151 Start Time: 1151 hrs.  Description/Narrative of Procedure:          Target: Epidural space via interlaminar opening, initially targeting the lower laminar border of the superior  vertebral body. Region: Lumbar Approach: Percutaneous paravertebral  Rationale (medical necessity): procedure needed and proper for the diagnosis and/or treatment of the patient's medical symptoms and needs. Procedural Technique Safety Precautions: Aspiration looking for blood return was conducted prior to all injections. At no point did we inject any substances, as a needle was being advanced. No attempts were made at seeking  any paresthesias. Safe injection practices and needle disposal techniques used. Medications properly checked for expiration dates. SDV (single dose vial) medications used. Description of the Procedure: Protocol guidelines were followed. The procedure needle was introduced through the skin, ipsilateral to the reported pain, and advanced to the target area. Bone was contacted and the needle walked caudad, until the lamina was cleared. The epidural space was identified using "loss-of-resistance technique" with 2-3 ml of PF-NaCl (0.9% NSS), in a 5cc LOR glass syringe.  Vitals:   02/07/23 1133 02/07/23 1148 02/07/23 1155  BP: (!) 140/54 135/80 130/85  Pulse: 63    Resp:  (!) 22 (!) 23  Temp: (!) 97.2 F (36.2 C)    SpO2: 97% 97% 99%  Weight: 160 lb (72.6 kg)    Height: 6' (1.829 m)      Start Time: 1151 hrs. End Time: 1154 hrs.  Imaging Guidance (Spinal):          Type of Imaging Technique: Fluoroscopy Guidance (Spinal) Indication(s): Fluoroscopy guidance for needle placement to enhance accuracy in procedures requiring precise needle localization for targeted delivery of medication in or near specific anatomical locations not easily accessible without such real-time imaging assistance. Exposure Time: Please see nurses notes. Contrast: Before injecting any contrast, we confirmed that the patient did not have an allergy to iodine, shellfish, or radiological contrast. Once satisfactory needle placement was completed at the desired level, radiological contrast was injected.  Contrast injected under live fluoroscopy. No contrast complications. See chart for type and volume of contrast used. Fluoroscopic Guidance: I was personally present during the use of fluoroscopy. Tunnel Vision Technique used to obtain the best possible view of the target area. Parallax error corrected before commencing the procedure. Direction-depth-direction technique used to introduce the needle under continuous pulsed fluoroscopy. Once target was reached, antero-posterior, oblique, and lateral fluoroscopic projection used confirm needle placement in all planes. Images permanently stored in EMR. Interpretation: I personally interpreted the imaging intraoperatively. Adequate needle placement confirmed in multiple planes. Appropriate spread of contrast into desired area was observed. No evidence of afferent or efferent intravascular uptake. No intrathecal or subarachnoid spread observed. Permanent images saved into the patient's record.  Antibiotic Prophylaxis:   Anti-infectives (From admission, onward)    None      Indication(s): None identified  Post-operative Assessment:  Post-procedure Vital Signs:  Pulse/HCG Rate: 6375 Temp: (!) 97.2 F (36.2 C) Resp: (!) 23 BP: 130/85 SpO2: 99 %  EBL: None  Complications: No immediate post-treatment complications observed by team, or reported by patient.  Note: The patient tolerated the entire procedure well. A repeat set of vitals were taken after the procedure and the patient was kept under observation following institutional policy, for this type of procedure. Post-procedural neurological assessment was performed, showing return to baseline, prior to discharge. The patient was provided with post-procedure discharge instructions, including a section on how to identify potential problems. Should any problems arise concerning this procedure, the patient was given instructions to immediately contact us , at any time, without hesitation. In any  case, we plan to contact the patient by telephone for a follow-up status report regarding this interventional procedure.  Comments:  No additional relevant information.  Plan of Care (POC)  Orders:  Orders Placed This Encounter  Procedures   DG PAIN CLINIC C-ARM 1-60 MIN NO REPORT    Intraoperative interpretation by procedural physician at [facility].    Standing Status:   Standing    Number of Occurrences:   1  Reason for exam::   Assistance in needle guidance and placement for procedures requiring needle placement in or near specific anatomical locations not easily accessible without such assistance.     Medications ordered for procedure: Meds ordered this encounter  Medications   iohexol  (OMNIPAQUE ) 180 MG/ML injection 10 mL    Must be Myelogram-compatible. If not available, you may substitute with a water-soluble, non-ionic, hypoallergenic, myelogram-compatible radiological contrast medium.   lidocaine  (XYLOCAINE ) 2 % (with pres) injection 400 mg   ropivacaine  (PF) 2 mg/mL (0.2%) (NAROPIN ) injection 2 mL   sodium chloride  flush (NS) 0.9 % injection 2 mL   dexamethasone  (DECADRON ) injection 10 mg   Medications administered: We administered iohexol , lidocaine , ropivacaine  (PF) 2 mg/mL (0.2%), sodium chloride  flush, and dexamethasone .  See the medical record for exact dosing, route, and time of administration.  Follow-up plan:   Return in about 5 weeks (around 03/14/2023) for PPE, F2F .       Recent Visits Date Type Provider Dept  01/11/23 Office Visit Marcelino Nurse, MD [facility]  12/08/22 Office Visit Marcelino Nurse, MD [facility]  11/17/22 Procedure visit Marcelino Nurse, MD [facility]  Showing recent visits within past 90 days and meeting all other requirements Today's Visits Date Type Provider Dept  02/07/23 Procedure visit Marcelino Nurse, MD [facility]  Showing today's visits and meeting all other  requirements Future Appointments Date Type Provider Dept  03/14/23 Appointment Marcelino Nurse, MD [facility]  Showing future appointments within next 90 days and meeting all other requirements  Disposition: Discharge home  Discharge (Date  Time): 02/07/2023; 1202 hrs.   Primary Care Physician: Glover Lenis, MD Location: [facility] Note by: Nurse Marcelino, MD (TTS technology used. I apologize for any typographical errors that were not detected and corrected.) Date: 02/07/2023; Time: 1:45 PM  Disclaimer:  Medicine is not an visual merchandiser. The only guarantee in medicine is that nothing is guaranteed. It is important to note that the decision to proceed with this intervention was based on the information collected from the patient. The Data and conclusions were drawn from the patient's questionnaire, the interview, and the physical examination. Because the information was provided in large part by the patient, it cannot be guaranteed that it has not been purposely or unconsciously manipulated. Every effort has been made to obtain as much relevant data as possible for this evaluation. It is important to note that the conclusions that lead to this procedure are derived in large part from the available data. Always take into account that the treatment will also be dependent on availability of resources and existing treatment guidelines, considered by other Pain Management Practitioners as being common knowledge and practice, at the time of the intervention. For Medico-Legal purposes, it is also important to point out that variation in procedural techniques and pharmacological choices are the acceptable norm. The indications, contraindications, technique, and results of the above procedure should only be interpreted and judged by a Board-Certified Interventional Pain Specialist with extensive familiarity and expertise in the same exact procedure and technique.

## 2023-02-07 NOTE — Progress Notes (Signed)
 Safety precautions to be maintained throughout the outpatient stay will include: orient to surroundings, keep bed in low position, maintain call bell within reach at all times, provide assistance with transfer out of bed and ambulation.

## 2023-02-07 NOTE — Patient Instructions (Signed)

## 2023-02-08 ENCOUNTER — Telehealth: Payer: Self-pay | Admitting: *Deleted

## 2023-02-08 NOTE — Telephone Encounter (Signed)
 No problems post procedure.

## 2023-02-17 DIAGNOSIS — H02106 Unspecified ectropion of left eye, unspecified eyelid: Secondary | ICD-10-CM | POA: Diagnosis not present

## 2023-02-22 ENCOUNTER — Other Ambulatory Visit: Payer: Self-pay | Admitting: Dermatology

## 2023-03-14 ENCOUNTER — Ambulatory Visit: Payer: PPO | Admitting: Student in an Organized Health Care Education/Training Program

## 2023-03-29 DIAGNOSIS — N401 Enlarged prostate with lower urinary tract symptoms: Secondary | ICD-10-CM | POA: Diagnosis not present

## 2023-03-29 DIAGNOSIS — E785 Hyperlipidemia, unspecified: Secondary | ICD-10-CM | POA: Diagnosis not present

## 2023-03-29 DIAGNOSIS — F32A Depression, unspecified: Secondary | ICD-10-CM | POA: Diagnosis not present

## 2023-03-29 DIAGNOSIS — I2510 Atherosclerotic heart disease of native coronary artery without angina pectoris: Secondary | ICD-10-CM | POA: Diagnosis not present

## 2023-03-30 ENCOUNTER — Encounter: Payer: Self-pay | Admitting: Student in an Organized Health Care Education/Training Program

## 2023-03-30 ENCOUNTER — Ambulatory Visit
Payer: PPO | Attending: Student in an Organized Health Care Education/Training Program | Admitting: Student in an Organized Health Care Education/Training Program

## 2023-03-30 VITALS — BP 152/76 | HR 64 | Temp 97.4°F | Resp 17 | Ht 71.0 in | Wt 165.0 lb

## 2023-03-30 DIAGNOSIS — G8929 Other chronic pain: Secondary | ICD-10-CM | POA: Diagnosis not present

## 2023-03-30 DIAGNOSIS — M48062 Spinal stenosis, lumbar region with neurogenic claudication: Secondary | ICD-10-CM | POA: Diagnosis not present

## 2023-03-30 DIAGNOSIS — G894 Chronic pain syndrome: Secondary | ICD-10-CM | POA: Insufficient documentation

## 2023-03-30 DIAGNOSIS — M5416 Radiculopathy, lumbar region: Secondary | ICD-10-CM | POA: Insufficient documentation

## 2023-03-30 NOTE — Patient Instructions (Signed)
 ______________________________________________________________________    Preparing for your procedure  Appointments: If you think you may not be able to keep your appointment, call 24-48 hours in advance to cancel. We need time to make it available to others.  Procedure visits are for procedures only. During your procedure appointment there will be: NO Prescription Refills*. NO medication changes or discussions*. NO discussion of disability issues*. NO unrelated pain problem evaluations*. NO evaluations to order other pain procedures*. *These will be addressed at a separate and distinct evaluation encounter on the provider's evaluation schedule and not during procedure days.  Instructions: Food intake: Avoid eating anything solid for at least 8 hours prior to your procedure. Clear liquid intake: You may take clear liquids such as water up to 2 hours prior to your procedure. (No carbonated drinks. No soda.) Transportation: Unless otherwise stated by your physician, bring a driver. (Driver cannot be a Market researcher, Pharmacist, community, or any other form of public transportation.) Morning Medicines: Except for blood thinners, take all of your other morning medications with a sip of water. Make sure to take your heart and blood pressure medicines. If your blood pressure's lower number is above 100, the case will be rescheduled. Blood thinners: Make sure to stop your blood thinners as instructed.  If you take a blood thinner, but were not instructed to stop it, call our office [phone] and ask to talk to a nurse. Not stopping a blood thinner prior to certain procedures could lead to serious complications. Diabetics on insulin: Notify the staff so that you can be scheduled 1st case in the morning. If your diabetes requires high dose insulin, take only  of your normal insulin dose the morning of the procedure and notify the staff that you have done so. Preventing infections: Shower with an antibacterial soap the  morning of your procedure.  Build-up your immune system: Take 1000 mg of Vitamin C with every meal (3 times a day) the day prior to your procedure. Antibiotics: Inform the nursing staff if you are taking any antibiotics or if you have any conditions that may require antibiotics prior to procedures. (Example: recent joint implants)   Pregnancy: If you are pregnant make sure to notify the nursing staff. Not doing so may result in injury to the fetus, including death.  Sickness: If you have a cold, fever, or any active infections, call and cancel or reschedule your procedure. Receiving steroids while having an infection may result in complications. Arrival: You must be in the facility at least 30 minutes prior to your scheduled procedure. Tardiness: Your scheduled time is also the cutoff time. If you do not arrive at least 15 minutes prior to your procedure, you will be rescheduled.  Children: Do not bring any children with you. Make arrangements to keep them home. Dress appropriately: There is always a possibility that your clothing may get soiled. Avoid long dresses. Valuables: Do not bring any jewelry or valuables.  Reasons to call and reschedule or cancel your procedure: (Following these recommendations will minimize the risk of a serious complication.) Surgeries: Avoid having procedures within 2 weeks of any surgery. (Avoid for 2 weeks before or after any surgery). Flu Shots: Avoid having procedures within 2 weeks of a flu shots or . (Avoid for 2 weeks before or after immunizations). Barium: Avoid having a procedure within 7-10 days after having had a radiological study involving the use of radiological contrast. (Myelograms, Barium swallow or enema study). Heart attacks: Avoid any elective procedures or surgeries for the  initial 6 months after a "Myocardial Infarction" (Heart Attack). Blood thinners: It is imperative that you stop these medications before procedures. Let us know if you if you take  any blood thinner.  Infection: Avoid procedures during or within two weeks of an infection (including chest colds or gastrointestinal problems). Symptoms associated with infections include: Localized redness, fever, chills, night sweats or profuse sweating, burning sensation when voiding, cough, congestion, stuffiness, runny nose, sore throat, diarrhea, nausea, vomiting, cold or Flu symptoms, recent or current infections. It is specially important if the infection is over the area that we intend to treat. Heart and lung problems: Symptoms that may suggest an active cardiopulmonary problem include: cough, chest pain, breathing difficulties or shortness of breath, dizziness, ankle swelling, uncontrolled high or unusually low blood pressure, and/or palpitations. If you are experiencing any of these symptoms, cancel your procedure and contact your primary care physician for an evaluation.  Remember:  Regular Business hours are:  Monday to Thursday 8:00 AM to 4:00 PM  Provider's Schedule: Delano Metz, MD:  Procedure days: Tuesday and Thursday 7:30 AM to 4:00 PM  Edward Jolly, MD:  Procedure days: Monday and Wednesday 7:30 AM to 4:00 PM Last  Updated: 01/04/2023 ______________________________________________________________________    Epidural Steroid Injection Patient Information  Description: The epidural space surrounds the nerves as they exit the spinal cord.  In some patients, the nerves can be compressed and inflamed by a bulging disc or a tight spinal canal (spinal stenosis).  By injecting steroids into the epidural space, we can bring irritated nerves into direct contact with a potentially helpful medication.  These steroids act directly on the irritated nerves and can reduce swelling and inflammation which often leads to decreased pain.  Epidural steroids may be injected anywhere along the spine and from the neck to the low back depending upon the location of your pain.   After numbing  the skin with local anesthetic (like Novocaine), a small needle is passed into the epidural space slowly.  You may experience a sensation of pressure while this is being done.  The entire block usually last less than 10 minutes.  Conditions which may be treated by epidural steroids:  Low back and leg pain Neck and arm pain Spinal stenosis Post-laminectomy syndrome Herpes zoster (shingles) pain Pain from compression fractures  Preparation for the injection:  Do not eat any solid food or dairy products within 8 hours of your appointment.  You may drink clear liquids up to 3 hours before appointment.  Clear liquids include water, black coffee, juice or soda.  No milk or cream please. You may take your regular medication, including pain medications, with a sip of water before your appointment  Diabetics should hold regular insulin (if taken separately) and take 1/2 normal NPH dos the morning of the procedure.  Carry some sugar containing items with you to your appointment. A driver must accompany you and be prepared to drive you home after your procedure.  Bring all your current medications with your. An IV may be inserted and sedation may be given at the discretion of the physician.   A blood pressure cuff, EKG and other monitors will often be applied during the procedure.  Some patients may need to have extra oxygen administered for a short period. You will be asked to provide medical information, including your allergies, prior to the procedure.  We must know immediately if you are taking blood thinners (like Coumadin/Warfarin)  Or if you are allergic to IV  iodine contrast (dye). We must know if you could possible be pregnant.  Possible side-effects: Bleeding from needle site Infection (rare, may require surgery) Nerve injury (rare) Numbness & tingling (temporary) Difficulty urinating (rare, temporary) Spinal headache ( a headache worse with upright posture) Light -headedness  (temporary) Pain at injection site (several days) Decreased blood pressure (temporary) Weakness in arm/leg (temporary) Pressure sensation in back/neck (temporary)  Call if you experience: Fever/chills associated with headache or increased back/neck pain. Headache worsened by an upright position. New onset weakness or numbness of an extremity below the injection site Hives or difficulty breathing (go to the emergency room) Inflammation or drainage at the infection site Severe back/neck pain Any new symptoms which are concerning to you  Please note:  Although the local anesthetic injected can often make your back or neck feel good for several hours after the injection, the pain will likely return.  It takes 3-7 days for steroids to work in the epidural space.  You may not notice any pain relief for at least that one week.  If effective, we will often do a series of three injections spaced 3-6 weeks apart to maximally decrease your pain.  After the initial series, we generally will wait several months before considering a repeat injection of the same type.  If you have any questions, please call [phone] [facility]

## 2023-03-30 NOTE — Progress Notes (Signed)
 PROVIDER NOTE: Information contained herein reflects review and annotations entered in association with encounter. Interpretation of such information and data should be left to medically-trained personnel. Information provided to patient can be located elsewhere in the medical record under "Patient Instructions". Document created using STT-dictation technology, any transcriptional errors that may result from process are unintentional.    Patient: Todd Bennett  Service Category: E/M  Provider: Edward Jolly, MD  DOB: August 25, 1932  DOS: 03/30/2023  Referring Provider: Dorothey Baseman, MD  MRN: [id number]  Specialty: Interventional Pain Management  PCP: Dorothey Baseman, MD  Type: Established Patient  Setting: Ambulatory outpatient    Location: Office  Delivery: Face-to-face     HPI  Todd Bennett, a 89 y.o. year old male, is here today because of his Spinal stenosis, lumbar region, with neurogenic claudication [M48.062]. Todd Bennett primary complain today is Hip Pain (bilateral) and Leg Pain (Bilateral and posterior to knee)  Pain Assessment: Severity of Chronic pain is reported as a 9 /10. Location: Back Lower/legs. Onset: More than a month ago. Quality: Constant, Aching. Timing: Constant. Modifying factor(s): rest, procedure. Vitals:  height is 5\' 11"  (1.803 m) and weight is 165 lb (74.8 kg). His temporal temperature is 97.4 F (36.3 C) (abnormal). His blood pressure is 152/76 (abnormal) and his pulse is 64. His respiration is 17 and oxygen saturation is 95%.  BMI: Estimated body mass index is 23.01 kg/m as calculated from the following:   Height as of this encounter: 5\' 11"  (1.803 m).   Weight as of this encounter: 165 lb (74.8 kg). Last encounter: 01/11/2023. Last procedure: 02/07/2023.  Reason for encounter: post-procedure evaluation and assessment.   Post-procedure evaluation    Type: Lumbar epidural steroid injection (LESI) (interlaminar) #1    Laterality: Midline   Level:  L4-5 Level.   Imaging: Fluoroscopic guidance         Anesthesia: Local anesthesia (1-2% Lidocaine) DOS: 02/07/2023  Performed by: Edward Jolly, MD  Purpose: Diagnostic/Therapeutic Indications: Lumbar radicular pain of intraspinal etiology of more than 4 weeks that has failed to respond to conservative therapy and is severe enough to impact quality of life or function. 1. Spinal stenosis, lumbar region, with neurogenic claudication   2. Lumbar spondylosis   3. Chronic pain syndrome    NAS-11 Pain score:   Pre-procedure: 9 /10   Post-procedure: 0-No pain/10     Effectiveness:  Initial hour after procedure: 50 %  Subsequent 4-6 hours post-procedure: 50 %  Analgesia past initial 6 hours: 70 % (lasted for about 1 month then returned to baseline.)  Ongoing improvement:  Analgesic:  70% for 1 month, now returning back to baseline Function: Somewhat improved ROM: Somewhat improved     ROS  Constitutional: Denies any fever or chills Gastrointestinal: No reported hemesis, hematochezia, vomiting, or acute GI distress Musculoskeletal: Denies any acute onset joint swelling, redness, loss of ROM, or weakness Neurological:  Difficulty standing and walking.  Numbness and tingling in both legs with prolonged standing or walking improves with sitting and resting  Medication Review  Cholecalciferol, FLUoxetine, Fish Oil, Multi-Vitamins, acetaminophen, aspirin EC, atorvastatin, celecoxib, finasteride, losartan, metoprolol tartrate, metroNIDAZOLE, mirtazapine, nitroGLYCERIN, omeprazole, tamsulosin, venlafaxine XR, and vitamin C  History Review  Allergy: Todd Bennett is allergic to ciprofloxacin, clarithromycin, elemental sulfur, oxycodone, and penicillins. Drug: Todd Bennett  reports no history of drug use. Alcohol:  reports no history of alcohol use. Tobacco:  reports that he has quit smoking. He has never used smokeless tobacco. Social: Mr.  Bennett  reports that he has quit smoking. He has never used smokeless  tobacco. He reports that he does not drink alcohol and does not use drugs. Medical:  has a past medical history of Actinic keratosis, Allergy, Anxiety (06/17/2014), Basal cell carcinoma (06/24/2009), Basal cell carcinoma (09/11/2012), Basal cell carcinoma (02/19/2021), Benign prostatic hyperplasia with urinary obstruction (06/17/2014), Benign prostatic hypertrophy without urinary obstruction (06/20/2013), Bladder calculi (04/26/2013), Calculi, ureter (03/29/2013), Calculus of kidney (02/25/2013), Cataract, Clinical depression (06/17/2014), Degenerative disc disease, lumbar, Frank hematuria (06/28/2013), HLD (hyperlipidemia) (06/17/2014), squamous cell carcinoma, Spondylolysis, Squamous cell carcinoma of skin (09/11/2012), Squamous cell carcinoma of skin (03/10/2015), and Squamous cell carcinoma of skin (07/26/2016). Surgical: Todd Bennett  has a past surgical history that includes Ileostomy; Lithotripsy (03/2013); Hernia repair (1993); LEFT HEART CATH AND CORONARY ANGIOGRAPHY (N/A, 03/15/2017); CORONARY STENT INTERVENTION (N/A, 03/15/2017); IR Angiogram Extremity Left (02/10/2018); IR Angiogram Selective Each Additional Vessel (02/10/2018); IR Angiogram Selective Each Additional Vessel (02/10/2018); IR US Guide Vasc Access Right (02/10/2018); IR EMBO VENOUS NOT HEMORR HEMANG  INC GUIDE ROADMAPPING (02/10/2018); and IR Angiogram Selective Each Additional Vessel (02/10/2018). Family: family history includes Diabetes in his mother.  Laboratory Chemistry Profile   Renal Lab Results  Component Value Date   BUN 20 09/03/2021   CREATININE 0.90 09/03/2021   BCR 17 10/13/2017   GFRAA >60 02/12/2018   GFRNONAA >60 09/03/2021    Hepatic Lab Results  Component Value Date   AST 25 03/14/2017   ALT 23 03/14/2017   ALBUMIN 3.9 03/14/2017   ALKPHOS 52 03/14/2017   LIPASE 39 03/14/2017    Electrolytes Lab Results  Component Value Date   NA 139 09/03/2021   K 4.0 09/03/2021   CL 109 09/03/2021   CALCIUM 9.1  09/03/2021    Bone No results found for: "VD25OH", "VD125OH2TOT", "NW2956OZ3", "YQ6578IO9", "25OHVITD1", "25OHVITD2", "25OHVITD3", "TESTOFREE", "TESTOSTERONE"  Inflammation (CRP: Acute Phase) (ESR: Chronic Phase) No results found for: "CRP", "ESRSEDRATE", "LATICACIDVEN"       Note: Above Lab results reviewed.  Physical Exam  General appearance: Well nourished, well developed, and well hydrated. In no apparent acute distress Mental status: Alert, oriented x 3 (person, place, & time)       Respiratory: No evidence of acute respiratory distress Eyes: PERLA Vitals: BP (!) 152/76   Pulse 64   Temp (!) 97.4 F (36.3 C) (Temporal)   Resp 17   Ht 5\' 11"  (1.803 m)   Wt 165 lb (74.8 kg)   SpO2 95%   BMI 23.01 kg/m  BMI: Estimated body mass index is 23.01 kg/m as calculated from the following:   Height as of this encounter: 5\' 11"  (1.803 m).   Weight as of this encounter: 165 lb (74.8 kg). Ideal: Ideal body weight: 75.3 kg (166 lb 0.1 oz)   Physical Exam  General appearance: Well nourished, well developed, and well hydrated. In no apparent acute distress Mental status: Alert, oriented x 3 (person, place, & time)       Respiratory: No evidence of acute respiratory distress Eyes: PERLA Vitals: BP 119/69   Pulse 60   Temp 97.9 F (36.6 C)   Resp 16   Ht 6' (1.829 m)   Wt 160 lb (72.6 kg)   SpO2 95%   BMI 21.70 kg/m  BMI: Estimated body mass index is 21.7 kg/m as calculated from the following:   Height as of this encounter: 6' (1.829 m).   Weight as of this encounter: 160 lb (72.6 kg). Ideal: Ideal body weight:  77.6 kg (171 lb 1.2 oz)   Lumbar Spine Area Exam  Skin & Axial Inspection: No masses, redness, or swelling Alignment: Symmetrical Functional ROM: Pain restricted ROM affecting both sides Stability: No instability detected Muscle Tone/Strength: Functionally intact. No obvious neuro-muscular anomalies detected. Sensory (Neurological): Neurogenic pain pattern   Gait &  Posture Assessment  Ambulation: Patient came in today in a wheel chair Gait: Limited. Using assistive device to ambulate Posture: Difficulty standing up straight, due to pain  Lower Extremity Exam      Side: Right lower extremity   Side: Left lower extremity  Stability: No instability observed           Stability: No instability observed          Skin & Extremity Inspection: Skin color, temperature, and hair growth are WNL. No peripheral edema or cyanosis. No masses, redness, swelling, asymmetry, or associated skin lesions. No contractures.   Skin & Extremity Inspection: Skin color, temperature, and hair growth are WNL. No peripheral edema or cyanosis. No masses, redness, swelling, asymmetry, or associated skin lesions. No contractures.  Functional ROM: Unrestricted ROM                   Functional ROM: Unrestricted ROM                  Muscle Tone/Strength: Functionally intact. No obvious neuro-muscular anomalies detected.   Muscle Tone/Strength: Functionally intact. No obvious neuro-muscular anomalies detected.  Sensory (Neurological): Unimpaired         Sensory (Neurological): Unimpaired        DTR: Patellar: deferred today Achilles: deferred today Plantar: deferred today   DTR: Patellar: deferred today Achilles: deferred today Plantar: deferred today  Palpation: No palpable anomalies   Palpation: No palpable anomalies    Assessment   Diagnosis  1. Spinal stenosis, lumbar region, with neurogenic claudication   2. Chronic radicular lumbar pain   3. Chronic pain syndrome      Updated Problems: No problems updated.  Plan of Care  Low back pain with lower extremity discomfort exacerbated by ambulation, consistent with SS & neurogenic claudication  lumbar spinal stenosis, presenting with progressive neurogenic claudication characterized by:  Pain, numbness, and/or weakness in the lower extremities with prolonged standing/walking Relief with forward flexion/sitting No  significant radicular symptoms or acute neurological deficits Prior response to lumbar epidural steroid injection (LESI), but recurrence of symptoms MRI findings show  moderate to severe central canal stenosis at L4-L5 with ligamentum flavum hypertrophy and facet arthropathy from MRI done in 2018, contributing to the patient's symptoms. Recommend repeating MRI in planning for MILD  Plan: Interventional Treatment:  Repeat lumbar epidural steroid injection (LESI) at L4/5  under fluoroscopic guidance Goal: Reduce inflammation and improve ambulation tolerance Consider Minimally Invasive Lumbar Decompression (MILD) procedure if symptoms persist, resources given to pt Indicated for stenosis due to ligamentum flavum hypertrophy Goal: Improve dural sac patency and reduce neurogenic claudication  Conservative Management:  Physical therapy - Core stabilization, lumbar flexion-based exercises Activity modifications - Encourage frequent breaks and use of assistive devices (e.g., cane, walker if needed)  Follow-up & Next Steps:  Repeat ESI then  re-evaluate in 4-6 weeks post-LESI for symptom improvement If limited or short-term relief, discuss MILD procedure   Todd Bennett has a current medication list which includes the following long-term medication(s): atorvastatin, fluoxetine, losartan, metoprolol tartrate, mirtazapine, nitroglycerin, and omeprazole.  Pharmacotherapy (Medications Ordered): No orders of the defined types were placed in this encounter.  Orders:  Orders Placed This Encounter  Procedures   Lumbar Epidural Injection    Standing Status:   Future    Expiration Date:   06/30/2023    Scheduling Instructions:     Procedure: Interlaminar Lumbar Epidural Steroid injection (LESI)            Laterality: Midline: L4/5      Sedation: without     Timeframe: ASAA    Where will this procedure be performed?:   [facility]   MR LUMBAR SPINE WO CONTRAST    Patient presents with  axial pain with possible radicular component. Please assist [place] in identifying specific level(s) and laterality of any additional findings such as: 1. Facet (Zygapophyseal) joint DJD (Hypertrophy, space narrowing, subchondral sclerosis, and/or osteophyte formation) 2. DDD and/or IVDD (Loss of disc height, desiccation, gas patterns, osteophytes, endplate sclerosis, or "Black disc disease") 3. Pars defects 4. Spondylolisthesis, spondylosis, and/or spondyloarthropathies (include Degree/Grade of displacement in mm) (stability) 5. Vertebral body Fractures (acute/chronic) (state percentage of collapse) 6. Demineralization (osteopenia/osteoporotic) 7. Bone pathology 8. Foraminal narrowing  9. Surgical changes 10. Central, Lateral Recess, and/or Foraminal Stenosis (include AP diameter of stenosis in mm) 11. Surgical changes (hardware type, status, and presence of fibrosis) 12. Modic Type Changes (MRI only) 13. IVDD (Disc bulge, protrusion, herniation, extrusion) (Level, laterality, extent)    Standing Status:   Future    Expiration Date:   04/30/2023    Scheduling Instructions:     Please make sure that the patient understands that this needs to be done as soon as possible. Never have the patient do the imaging "just before the next appointment". Inform patient that having the imaging done within the [facility] will expedite the availability of the results and will provide      imaging availability to the requesting physician. In addition inform the patient that the imaging order has an expiration date and will not be renewed if not done within the active period.    What is the patient's sedation requirement?:   No Sedation    Does the patient have a pacemaker or implanted devices?:   No    Preferred imaging location?:   [facility] (table limit-350lbs)    Call Results- Best Contact Number?:   [phone] [facility])    Radiology Contrast Protocol - do NOT remove file path:    [email or web address]   Follow-up plan:   Return in about 7 weeks (around 05/16/2023) for L4/5 ESI, in clinic [facility].      Recent Visits Date Type Provider Dept  02/07/23 Procedure visit Edward Jolly, MD [facility]  01/11/23 Office Visit Edward Jolly, MD [facility]  Showing recent visits within past 90 days and meeting all other requirements Today's Visits Date Type Provider Dept  03/30/23 Office Visit Edward Jolly, MD [facility]  Showing today's visits and meeting all other requirements Future Appointments Date Type Provider Dept  05/16/23 Appointment Edward Jolly, MD [facility]  Showing future appointments within next 90 days and meeting all other requirements  I discussed the assessment and treatment plan with the patient. The patient was provided an opportunity to ask questions and all were answered. The patient agreed with the plan and demonstrated an understanding of the instructions.  Patient advised to call back or seek an in-person evaluation if the symptoms or condition worsens.  Duration of encounter: 30 minutes.  Total time on encounter, as per AMA guidelines included both the face-to-face and  non-face-to-face time personally spent by the physician and/or other qualified health care professional(s) on the day of the encounter (includes time in activities that require the physician or other qualified health care professional and does not include time in activities normally performed by clinical staff). Physician's time may include the following activities when performed: Preparing to see the patient (e.g., pre-charting review of records, searching for previously ordered imaging, lab work, and nerve conduction tests) Review of prior analgesic pharmacotherapies. Reviewing PMP Interpreting ordered tests (e.g., lab work, imaging, nerve conduction tests) Performing post-procedure evaluations, including  interpretation of diagnostic procedures Obtaining and/or reviewing separately obtained history Performing a medically appropriate examination and/or evaluation Counseling and educating the patient/family/caregiver Ordering medications, tests, or procedures Referring and communicating with other health care professionals (when not separately reported) Documenting clinical information in the electronic or other health record Independently interpreting results (not separately reported) and communicating results to the patient/ family/caregiver Care coordination (not separately reported)  Note by: Edward Jolly, MD Date: 03/30/2023; Time: 3:44 PM

## 2023-03-31 DIAGNOSIS — H189 Unspecified disorder of cornea: Secondary | ICD-10-CM | POA: Diagnosis not present

## 2023-03-31 DIAGNOSIS — H02106 Unspecified ectropion of left eye, unspecified eyelid: Secondary | ICD-10-CM | POA: Diagnosis not present

## 2023-03-31 DIAGNOSIS — D23122 Other benign neoplasm of skin of left lower eyelid, including canthus: Secondary | ICD-10-CM | POA: Diagnosis not present

## 2023-04-01 ENCOUNTER — Ambulatory Visit: Payer: PPO | Admitting: Podiatry

## 2023-04-05 ENCOUNTER — Ambulatory Visit
Admission: RE | Admit: 2023-04-05 | Discharge: 2023-04-05 | Disposition: A | Discharge status: Home / Self Care | Source: Ambulatory Visit | Attending: Student in an Organized Health Care Education/Training Program | Admitting: Student in an Organized Health Care Education/Training Program

## 2023-04-05 DIAGNOSIS — M5416 Radiculopathy, lumbar region: Secondary | ICD-10-CM | POA: Insufficient documentation

## 2023-04-05 DIAGNOSIS — G8929 Other chronic pain: Secondary | ICD-10-CM

## 2023-04-05 DIAGNOSIS — G894 Chronic pain syndrome: Secondary | ICD-10-CM | POA: Diagnosis not present

## 2023-04-05 DIAGNOSIS — M48062 Spinal stenosis, lumbar region with neurogenic claudication: Secondary | ICD-10-CM | POA: Diagnosis not present

## 2023-04-05 DIAGNOSIS — M5136 Other intervertebral disc degeneration, lumbar region with discogenic back pain only: Secondary | ICD-10-CM | POA: Diagnosis not present

## 2023-04-05 DIAGNOSIS — M4316 Spondylolisthesis, lumbar region: Secondary | ICD-10-CM | POA: Diagnosis not present

## 2023-04-05 DIAGNOSIS — M48061 Spinal stenosis, lumbar region without neurogenic claudication: Secondary | ICD-10-CM | POA: Diagnosis not present

## 2023-04-06 DIAGNOSIS — Z933 Colostomy status: Secondary | ICD-10-CM | POA: Diagnosis not present

## 2023-04-18 DIAGNOSIS — I214 Non-ST elevation (NSTEMI) myocardial infarction: Secondary | ICD-10-CM | POA: Diagnosis not present

## 2023-04-18 DIAGNOSIS — I10 Essential (primary) hypertension: Secondary | ICD-10-CM | POA: Diagnosis not present

## 2023-04-18 DIAGNOSIS — Z955 Presence of coronary angioplasty implant and graft: Secondary | ICD-10-CM | POA: Diagnosis not present

## 2023-04-18 DIAGNOSIS — E782 Mixed hyperlipidemia: Secondary | ICD-10-CM | POA: Diagnosis not present

## 2023-04-18 DIAGNOSIS — K219 Gastro-esophageal reflux disease without esophagitis: Secondary | ICD-10-CM | POA: Diagnosis not present

## 2023-04-18 DIAGNOSIS — E785 Hyperlipidemia, unspecified: Secondary | ICD-10-CM | POA: Diagnosis not present

## 2023-04-18 DIAGNOSIS — I2510 Atherosclerotic heart disease of native coronary artery without angina pectoris: Secondary | ICD-10-CM | POA: Diagnosis not present

## 2023-04-19 ENCOUNTER — Ambulatory Visit: Admitting: Podiatry

## 2023-04-22 ENCOUNTER — Encounter: Payer: Self-pay | Admitting: Podiatry

## 2023-04-22 ENCOUNTER — Ambulatory Visit: Admitting: Podiatry

## 2023-04-22 DIAGNOSIS — M79674 Pain in right toe(s): Secondary | ICD-10-CM | POA: Diagnosis not present

## 2023-04-22 DIAGNOSIS — M79675 Pain in left toe(s): Secondary | ICD-10-CM

## 2023-04-22 DIAGNOSIS — B351 Tinea unguium: Secondary | ICD-10-CM

## 2023-04-22 NOTE — Progress Notes (Signed)
   Chief Complaint  Patient presents with   Nail Problem    Patient is here for [facility]    SUBJECTIVE Patient presents to office today complaining of elongated, thickened nails that cause pain while ambulating in shoes.  Patient is unable to trim their own nails. Patient is here for further evaluation and treatment.  Past Medical History:  Diagnosis Date   Actinic keratosis    Allergy    Anxiety 06/17/2014   Basal cell carcinoma 06/24/2009   Left nose sup. nasal alar crease.    Basal cell carcinoma 09/11/2012   Left lateral bicep. Focal sclerosis. Excised: 11/01/2012, margins free.   Basal cell carcinoma 02/19/2021   R preauricular - ED&C   Benign prostatic hyperplasia with urinary obstruction 06/17/2014   Benign prostatic hypertrophy without urinary obstruction 06/20/2013   Bladder calculi 04/26/2013   Calculi, ureter 03/29/2013   Calculus of kidney 02/25/2013   Cataract    Clinical depression 06/17/2014   Degenerative disc disease, lumbar    Frank hematuria 06/28/2013   HLD (hyperlipidemia) 06/17/2014   Hx of squamous cell carcinoma    multiple sites   Spondylolysis    Squamous cell carcinoma of skin 09/11/2012   Right lateral antecubital area. SCCis.    Squamous cell carcinoma of skin 03/10/2015   Right temple hair line. SCCis   Squamous cell carcinoma of skin 07/26/2016   Right temple hair line. SCCis, hypertrophic.    Allergies  Allergen Reactions   Ciprofloxacin Shortness Of Breath and Swelling    Tongue thickened   Clarithromycin Shortness Of Breath and Swelling    Tongue thickened   Elemental Sulfur Shortness Of Breath and Swelling    Tongue thicened   Oxycodone Shortness Of Breath and Swelling    Tongue thickened   Penicillins Shortness Of Breath and Swelling    Tongue thickened and knots on bottom of feet     OBJECTIVE General Patient is awake, alert, and oriented x 3 and in no acute distress. Derm Skin is dry and supple bilateral. Negative open  lesions or macerations. Remaining integument unremarkable. Nails are tender, long, thickened and dystrophic with subungual debris, consistent with onychomycosis, 1-5 bilateral. No signs of infection noted. Vasc  DP and PT pedal pulses palpable bilaterally. Temperature gradient within normal limits.  Neuro Epicritic and protective threshold sensation grossly intact bilaterally.  Musculoskeletal Exam No symptomatic pedal deformities noted bilateral. Muscular strength within normal limits.  ASSESSMENT 1.  Pain due to onychomycosis of toenails both  PLAN OF CARE 1. Patient evaluated today.  2. Instructed to maintain good pedal hygiene and foot care.  3. Mechanical debridement of nails 1-5 bilaterally performed using a nail nipper. Filed with dremel without incident.  4. Return to clinic in 3 mos.    Felecia Shelling, DPM [facility]  Dr. Felecia Shelling, DPM    [address] [place], [place] [address]                Office [phone]  Fax [phone]

## 2023-05-11 ENCOUNTER — Encounter: Payer: Self-pay | Admitting: Urology

## 2023-05-11 ENCOUNTER — Ambulatory Visit: Payer: Self-pay | Admitting: Urology

## 2023-05-11 VITALS — BP 106/61 | HR 70 | Ht 71.0 in | Wt 160.0 lb

## 2023-05-11 DIAGNOSIS — N320 Bladder-neck obstruction: Secondary | ICD-10-CM

## 2023-05-11 DIAGNOSIS — N400 Enlarged prostate without lower urinary tract symptoms: Secondary | ICD-10-CM

## 2023-05-11 DIAGNOSIS — N39498 Other specified urinary incontinence: Secondary | ICD-10-CM

## 2023-05-11 LAB — BLADDER SCAN AMB NON-IMAGING: Scan Result: 0

## 2023-05-11 NOTE — Progress Notes (Signed)
 Todd Bennett,acting as a scribe for Todd Scotland, MD.,have documented all relevant documentation on the behalf of Todd Scotland, MD,as directed by  Todd Scotland, MD while in the presence of Todd Scotland, MD.  05/11/23 2:54 PM   Todd Bennett May 13, 1932 [id number]  Referring provider: Dorothey Baseman, MD [phone] [place],  [place] [address]  Chief Complaint  Patient presents with   Benign Prostatic Hypertrophy    HPI: 89 year old male with a personal history of bladder outlet obstruction and a 120-gram prostate presents for follow-up. He has been managed on finasteride and Flomax. He was last seen a year ago.   He has previously considered undergoing a HoLEP procedure but did not follow through.   He reports urinary leakage occasionally, which he attributes to mobility issues rather than bladder problems. He has a history of an ablation procedure six years ago. He underwent an MRI about a month ago, but the results were sent to Dr. Cherylann Ratel, and he is unaware of the findings.   He is beyond the screening age for prostate cancer.  Results for orders placed or performed in visit on 05/11/23  Bladder Scan (Post Void Residual) in office  Result Value Ref Range   Scan Result 0     IPSS     Row Name 05/11/23 1100         International Prostate Symptom Score   How often have you had the sensation of not emptying your bladder? Less than 1 in 5     How often have you had to urinate less than every two hours? Less than 1 in 5 times     How often have you found you stopped and started again several times when you urinated? Less than 1 in 5 times     How often have you found it difficult to postpone urination? Less than 1 in 5 times     How often have you had a weak urinary stream? Less than 1 in 5 times     How often have you had to strain to start urination? Less than 1 in 5 times     How many times did you typically get up at night to urinate? 1 Time     Total IPSS  Score 7       Quality of Life due to urinary symptoms   If you were to spend the rest of your life with your urinary condition just the way it is now how would you feel about that? Mostly Satisfied              Score:  1-7 Mild 8-19 Moderate 20-35 Severe  PMH: Past Medical History:  Diagnosis Date   Actinic keratosis    Allergy    Anxiety 06/17/2014   Basal cell carcinoma 06/24/2009   Left nose sup. nasal alar crease.    Basal cell carcinoma 09/11/2012   Left lateral bicep. Focal sclerosis. Excised: 11/01/2012, margins free.   Basal cell carcinoma 02/19/2021   R preauricular - ED&C   Benign prostatic hyperplasia with urinary obstruction 06/17/2014   Benign prostatic hypertrophy without urinary obstruction 06/20/2013   Bladder calculi 04/26/2013   Calculi, ureter 03/29/2013   Calculus of kidney 02/25/2013   Cataract    Clinical depression 06/17/2014   Degenerative disc disease, lumbar    Frank hematuria 06/28/2013   HLD (hyperlipidemia) 06/17/2014   Hx of squamous cell carcinoma    multiple sites   Spondylolysis  Squamous cell carcinoma of skin 09/11/2012   Right lateral antecubital area. SCCis.    Squamous cell carcinoma of skin 03/10/2015   Right temple hair line. SCCis   Squamous cell carcinoma of skin 07/26/2016   Right temple hair line. SCCis, hypertrophic.    Surgical History: Past Surgical History:  Procedure Laterality Date   CORONARY STENT INTERVENTION N/A 03/15/2017   Procedure: CORONARY STENT INTERVENTION;  Surgeon: Alwyn Pea, MD;  Location: [facility];  Service: Cardiovascular;  Laterality: N/A;   HERNIA REPAIR  1993   ILEOSTOMY     IR ANGIOGRAM EXTREMITY LEFT  02/10/2018   IR ANGIOGRAM SELECTIVE EACH ADDITIONAL VESSEL  02/10/2018   IR ANGIOGRAM SELECTIVE EACH ADDITIONAL VESSEL  02/10/2018   IR ANGIOGRAM SELECTIVE EACH ADDITIONAL VESSEL  02/10/2018   IR EMBO VENOUS NOT HEMORR HEMANG  INC GUIDE ROADMAPPING  02/10/2018   IR US GUIDE  VASC ACCESS RIGHT  02/10/2018   LEFT HEART CATH AND CORONARY ANGIOGRAPHY N/A 03/15/2017   Procedure: LEFT HEART CATH AND CORONARY ANGIOGRAPHY;  Surgeon: Dalia Heading, MD;  Location: [facility];  Service: Cardiovascular;  Laterality: N/A;   LITHOTRIPSY  03/2013    Home Medications:  Allergies as of 05/11/2023       Reactions   Ciprofloxacin Shortness Of Breath, Swelling   Tongue thickened   Clarithromycin Shortness Of Breath, Swelling   Tongue thickened   Elemental Sulfur Shortness Of Breath, Swelling   Tongue thicened   Oxycodone Shortness Of Breath, Swelling   Tongue thickened   Penicillins Shortness Of Breath, Swelling   Tongue thickened and knots on bottom of feet        Medication List        Accurate as of May 11, 2023  2:54 PM. If you have any questions, ask your nurse or doctor.          acetaminophen 650 MG CR tablet Commonly known as: TYLENOL Take 650 mg by mouth every 8 (eight) hours as needed for pain.   aspirin EC 81 MG tablet Take 81 mg by mouth every morning.   atorvastatin 40 MG tablet Commonly known as: LIPITOR Take 1 tablet (40 mg total) by mouth daily at 6 PM.   celecoxib 200 MG capsule Commonly known as: CELEBREX Take 1 capsule by mouth 2 (two) times daily.   Cholecalciferol 25 MCG (1000 UT) tablet Take 1,000 Units by mouth every morning.   finasteride 5 MG tablet Commonly known as: PROSCAR TAKE ONE TABLET EVERY DAY   Fish Oil 1000 MG Caps Take 1,000 mg by mouth daily.   FLUoxetine 20 MG capsule Commonly known as: PROZAC Take 20 mg by mouth every morning. Take with 10mg  capsule to equal 30mg    losartan 25 MG tablet Commonly known as: COZAAR Take 1 tablet (25 mg total) by mouth daily.   metoprolol tartrate 25 MG tablet Commonly known as: LOPRESSOR Take 0.5 tablets (12.5 mg total) by mouth 2 (two) times daily.   metroNIDAZOLE 0.75 % cream Commonly known as: METROCREAM APPLY TO FACE FOR ROSACEA ONCE OR TWICE A DAY    mirtazapine 7.5 MG tablet Commonly known as: REMERON Take 7.5 mg by mouth at bedtime.   Multi-Vitamins Tabs Take 1 tablet by mouth every morning.   nitroGLYCERIN 0.4 MG SL tablet Commonly known as: NITROSTAT Place 1 tablet (0.4 mg total) under the tongue every 5 (five) minutes x 3 doses as needed for chest pain.   omeprazole 20 MG capsule  Commonly known as: PRILOSEC Take 20 mg by mouth daily.   tamsulosin 0.4 MG Caps capsule Commonly known as: FLOMAX Take 0.4 mg by mouth every evening.   venlafaxine XR 75 MG 24 hr capsule Commonly known as: EFFEXOR-XR Take 225 mg by mouth daily.   vitamin C 1000 MG tablet Take 1,000 mg by mouth every morning.        Allergies:  Allergies  Allergen Reactions   Ciprofloxacin Shortness Of Breath and Swelling    Tongue thickened   Clarithromycin Shortness Of Breath and Swelling    Tongue thickened   Elemental Sulfur Shortness Of Breath and Swelling    Tongue thicened   Oxycodone Shortness Of Breath and Swelling    Tongue thickened   Penicillins Shortness Of Breath and Swelling    Tongue thickened and knots on bottom of feet    Family History: Family History  Problem Relation Age of Onset   Diabetes Mother    Prostate cancer Neg Hx    Bladder Cancer Neg Hx     Social History:  reports that he has quit smoking. He has never used smokeless tobacco. He reports that he does not drink alcohol and does not use drugs.   Physical Exam: BP 106/61   Pulse 70   Ht 5\' 11"  (1.803 m)   Wt 160 lb (72.6 kg)   BMI 22.32 kg/m   Constitutional:  Alert and oriented, No acute distress. HEENT: [place] AT, moist mucus membranes.  Trachea midline, no masses. Neurologic: Grossly intact, no focal deficits, moving all 4 extremities. Psychiatric: Normal mood and affect.   Assessment & Plan:    1. Bladder Outlet Obstruction - He has a history of bladder outlet obstruction with a significantly enlarged prostate (120 grams).  - He is currently  managed on finasteride and pulomax.  - His PVR is zero, indicating effective bladder emptying.  - He reports occasional urinary leakage, which is attributed to mobility issues rather than bladder dysfunction.  - Continue current medications as symptoms are stable.  - Desires to f/u annually  2.  Urinary Leakage - He experiences occasional urinary leakage, likely due to mobility issues rather than intrinsic bladder problems.  - No new treatment is necessary at this time.  - Encourage him to manage mobility issues as best as possible and monitor for any changes in symptoms.  Return in about 1 year (around 05/10/2024) for PA visit for continued management.   [facility] [address] [place], [place] [address] [phone]

## 2023-05-12 DIAGNOSIS — Z933 Colostomy status: Secondary | ICD-10-CM | POA: Diagnosis not present

## 2023-05-16 ENCOUNTER — Ambulatory Visit
Admission: RE | Admit: 2023-05-16 | Discharge: 2023-05-16 | Disposition: A | Discharge status: Home / Self Care | Source: Ambulatory Visit | Attending: Student in an Organized Health Care Education/Training Program | Admitting: Student in an Organized Health Care Education/Training Program

## 2023-05-16 ENCOUNTER — Encounter: Payer: Self-pay | Admitting: Student in an Organized Health Care Education/Training Program

## 2023-05-16 ENCOUNTER — Ambulatory Visit
Attending: Student in an Organized Health Care Education/Training Program | Admitting: Student in an Organized Health Care Education/Training Program

## 2023-05-16 VITALS — BP 177/75 | HR 65 | Temp 97.9°F | Resp 20 | Ht 71.0 in | Wt 160.0 lb

## 2023-05-16 DIAGNOSIS — M48062 Spinal stenosis, lumbar region with neurogenic claudication: Secondary | ICD-10-CM

## 2023-05-16 DIAGNOSIS — G8929 Other chronic pain: Secondary | ICD-10-CM | POA: Diagnosis not present

## 2023-05-16 DIAGNOSIS — G894 Chronic pain syndrome: Secondary | ICD-10-CM

## 2023-05-16 DIAGNOSIS — M5416 Radiculopathy, lumbar region: Secondary | ICD-10-CM | POA: Insufficient documentation

## 2023-05-16 MED ORDER — DEXAMETHASONE SODIUM PHOSPHATE 10 MG/ML IJ SOLN
10.0000 mg | Freq: Once | INTRAMUSCULAR | Status: AC
  Administered 2023-05-16: 10 mg
  Filled 2023-05-16: qty 1

## 2023-05-16 MED ORDER — IOHEXOL 180 MG/ML  SOLN
10.0000 mL | Freq: Once | INTRAMUSCULAR | Status: AC
  Administered 2023-05-16: 10 mL via EPIDURAL
  Filled 2023-05-16: qty 20

## 2023-05-16 MED ORDER — LIDOCAINE HCL 2 % IJ SOLN
20.0000 mL | Freq: Once | INTRAMUSCULAR | Status: AC
  Administered 2023-05-16: 400 mg
  Filled 2023-05-16: qty 20

## 2023-05-16 MED ORDER — SODIUM CHLORIDE 0.9% FLUSH
2.0000 mL | Freq: Once | INTRAVENOUS | Status: AC
  Administered 2023-05-16: 10 mL

## 2023-05-16 MED ORDER — ROPIVACAINE HCL 2 MG/ML IJ SOLN
2.0000 mL | Freq: Once | INTRAMUSCULAR | Status: AC
  Administered 2023-05-16: 20 mL via EPIDURAL
  Filled 2023-05-16: qty 20

## 2023-05-16 NOTE — Progress Notes (Signed)
 Safety precautions to be maintained throughout the outpatient stay will include: orient to surroundings, keep bed in low position, maintain call bell within reach at all times, provide assistance with transfer out of bed and ambulation.

## 2023-05-16 NOTE — Patient Instructions (Addendum)
 ______________________________________________________________________    Post-Procedure Discharge Instructions  Instructions: Apply ice:  Purpose: This will minimize any swelling and discomfort after procedure.  When: Day of procedure, as soon as you get home. How: Fill a plastic sandwich bag with crushed ice. Cover it with a small towel and apply to injection site. How long: (15 min on, 15 min off) Apply for 15 minutes then remove x 15 minutes.  Repeat sequence on day of procedure, until you go to bed. Apply heat:  Purpose: To treat any soreness and discomfort from the procedure. When: Starting the next day after the procedure. How: Apply heat to procedure site starting the day following the procedure. How long: May continue to repeat daily, until discomfort goes away. Food intake: Start with clear liquids (like water) and advance to regular food, as tolerated.  Physical activities: Keep activities to a minimum for the first 8 hours after the procedure. After that, then as tolerated. Driving: If you have received any sedation, be responsible and do not drive. You are not allowed to drive for 24 hours after having sedation. Blood thinner: (Applies only to those taking blood thinners) You may restart your blood thinner 6 hours after your procedure. Insulin: (Applies only to Diabetic patients taking insulin) As soon as you can eat, you may resume your normal dosing schedule. Infection prevention: Keep procedure site clean and dry. Shower daily and clean area with soap and water. Post-procedure Pain Diary: Extremely important that this be done correctly and accurately. Recorded information will be used to determine the next step in treatment. For the purpose of accuracy, follow these rules: Evaluate only the area treated. Do not report or include pain from an untreated area. For the purpose of this evaluation, ignore all other areas of pain, except for the treated area. After your procedure,  avoid taking a long nap and attempting to complete the pain diary after you wake up. Instead, set your alarm clock to go off every hour, on the hour, for the initial 8 hours after the procedure. Document the duration of the numbing medicine, and the relief you are getting from it. Do not go to sleep and attempt to complete it later. It will not be accurate. If you received sedation, it is likely that you were given a medication that may cause amnesia. Because of this, completing the diary at a later time may cause the information to be inaccurate. This information is needed to plan your care. Follow-up appointment: Keep your post-procedure follow-up evaluation appointment after the procedure (usually 2 weeks for most procedures, 6 weeks for radiofrequencies). DO NOT FORGET to bring you pain diary with you.   Expect: (What should I expect to see with my procedure?) From numbing medicine (AKA: Local Anesthetics): Numbness or decrease in pain. You may also experience some weakness, which if present, could last for the duration of the local anesthetic. Onset: Full effect within 15 minutes of injected. Duration: It will depend on the type of local anesthetic used. On the average, 1 to 8 hours.  From steroids (Applies only if steroids were used): Decrease in swelling or inflammation. Once inflammation is improved, relief of the pain will follow. Onset of benefits: Depends on the amount of swelling present. The more swelling, the longer it will take for the benefits to be seen. In some cases, up to 10 days. Duration: Steroids will stay in the system x 2 weeks. Duration of benefits will depend on multiple posibilities including persistent irritating factors. Side-effects: If  present, they may typically last 2 weeks (the duration of the steroids). Frequent: Cramps (if they occur, drink Gatorade and take over-the-counter Magnesium 450-500 mg once to twice a day); water retention with temporary weight gain;  increases in blood sugar; decreased immune system response; increased appetite. Occasional: Facial flushing (red, warm cheeks); mood swings; menstrual changes. Uncommon: Long-term decrease or suppression of natural hormones; bone thinning. (These are more common with higher doses or more frequent use. This is why we prefer that our patients avoid having any injection therapies in other practices.)  Very Rare: Severe mood changes; psychosis; aseptic necrosis. From procedure: Some discomfort is to be expected once the numbing medicine wears off. This should be minimal if ice and heat are applied as instructed.  Call if: (When should I call?) You experience numbness and weakness that gets worse with time, as opposed to wearing off. New onset bowel or bladder incontinence. (Applies only to procedures done in the spine)  Emergency Numbers: Durning business hours (Monday - Thursday, 8:00 AM - 4:00 PM) (Friday, 9:00 AM - 12:00 Noon): [phone] After hours: [phone] NOTE: If you are having a problem and are unable connect with, or to talk to a provider, then go to your nearest urgent care or emergency department. If the problem is serious and urgent, please call 911. ______________________________________________________________________     You have spinal stenosis, which is a narrowing of the spinal canal. Dr. Rhesa Celeste will discuss this with you at your follow-up.

## 2023-05-16 NOTE — Progress Notes (Signed)
 PROVIDER NOTE: Interpretation of information contained herein should be left to medically-trained personnel. Specific patient instructions are provided elsewhere under "Patient Instructions" section of medical record. This document was created in part using STT-dictation technology, any transcriptional errors that may result from this process are unintentional.  Patient: Todd Bennett Type: Established DOB: July 14, 1932 MRN: [id number] PCP: Rory Collard, MD  Service: Procedure DOS: 05/16/2023 Setting: Ambulatory Location: Ambulatory outpatient facility Delivery: Face-to-face Provider: Cephus Collin, MD Specialty: Interventional Pain Management Specialty designation: 09 Location: Outpatient facility Ref. Prov.: Rory Collard, MD       Interventional Therapy   Type: Lumbar epidural steroid injection (LESI) (interlaminar) #2    Laterality: Midline   Level:  L4-5 Level.  Imaging: Fluoroscopic guidance         Anesthesia: Local anesthesia (1-2% Lidocaine ) DOS: 05/16/2023  Performed by: Cephus Collin, MD  Purpose: Diagnostic/Therapeutic Indications: Lumbar radicular pain of intraspinal etiology of more than 4 weeks that has failed to respond to conservative therapy and is severe enough to impact quality of life or function. 1. Spinal stenosis, lumbar region, with neurogenic claudication   2. Chronic radicular lumbar pain   3. Chronic pain syndrome    NAS-11 Pain score:   Pre-procedure: 8 /10   Post-procedure: 0-No pain/10      Position / Prep / Materials:  Position: Prone w/ head of the table raised (slight reverse trendelenburg) to facilitate breathing.  Prep solution: ChloraPrep (2% chlorhexidine gluconate and 70% isopropyl alcohol) Prep Area: Entire Posterior Lumbar Region from lower scapular tip down to mid buttocks area and from flank to flank. Materials:  Tray: Epidural tray Needle(s):  Type: Epidural needle (Tuohy) Gauge (G):  22 Length: Regular (3.5-in) Qty: 1   H&P  (Pre-op Assessment):  Todd Bennett is a 89 y.o. (year old), male patient, seen today for interventional treatment. He  has a past surgical history that includes Ileostomy; Lithotripsy (03/2013); Hernia repair (1993); LEFT HEART CATH AND CORONARY ANGIOGRAPHY (N/A, 03/15/2017); CORONARY STENT INTERVENTION (N/A, 03/15/2017); IR Angiogram Extremity Left (02/10/2018); IR Angiogram Selective Each Additional Vessel (02/10/2018); IR Angiogram Selective Each Additional Vessel (02/10/2018); IR US  Guide Vasc Access Right (02/10/2018); IR EMBO VENOUS NOT HEMORR HEMANG  INC GUIDE ROADMAPPING (02/10/2018); and IR Angiogram Selective Each Additional Vessel (02/10/2018). Todd Bennett has a current medication list which includes the following prescription(s): acetaminophen , vitamin c , aspirin  ec, atorvastatin , celecoxib , cholecalciferol , finasteride , fluoxetine , losartan , metoprolol  tartrate, metronidazole , mirtazapine , multi-vitamins, nitroglycerin , fish oil, omeprazole, tamsulosin , and venlafaxine  xr. His primarily concern today is the Hip Pain (Bilateral hips and legs and lower back)  Initial Vital Signs:  Pulse/HCG Rate: (!) 59  Temp: 97.9 F (36.6 C) Resp: 18 BP: (!) 148/64 SpO2: 96 %  BMI: Estimated body mass index is 22.32 kg/m as calculated from the following:   Height as of this encounter: 5\' 11"  (1.803 m).   Weight as of this encounter: 160 lb (72.6 kg).  Risk Assessment: Allergies: Reviewed. He is allergic to ciprofloxacin, clarithromycin, elemental sulfur, oxycodone, and penicillins.  Allergy Precautions: None required Coagulopathies: Reviewed. None identified.  Blood-thinner therapy: None at this time Active Infection(s): Reviewed. None identified. Todd Bennett is afebrile  Site Confirmation: Todd Bennett was asked to confirm the procedure and laterality before marking the site Procedure checklist: Completed Consent: Before the procedure and under the influence of no sedative(s), amnesic(s), or anxiolytics, the patient  was informed of the treatment options, risks and possible complications. To fulfill our ethical and legal obligations, as recommended by the American Medical  Association's Code of Ethics, I have informed the patient of my clinical impression; the nature and purpose of the treatment or procedure; the risks, benefits, and possible complications of the intervention; the alternatives, including doing nothing; the risk(s) and benefit(s) of the alternative treatment(s) or procedure(s); and the risk(s) and benefit(s) of doing nothing. The patient was provided information about the general risks and possible complications associated with the procedure. These may include, but are not limited to: failure to achieve desired goals, infection, bleeding, organ or nerve damage, allergic reactions, paralysis, and death. In addition, the patient was informed of those risks and complications associated to Spine-related procedures, such as failure to decrease pain; infection (i.e.: Meningitis, epidural or intraspinal abscess); bleeding (i.e.: epidural hematoma, subarachnoid hemorrhage, or any other type of intraspinal or peri-dural bleeding); organ or nerve damage (i.e.: Any type of peripheral nerve, nerve root, or spinal cord injury) with subsequent damage to sensory, motor, and/or autonomic systems, resulting in permanent pain, numbness, and/or weakness of one or several areas of the body; allergic reactions; (i.e.: anaphylactic reaction); and/or death. Furthermore, the patient was informed of those risks and complications associated with the medications. These include, but are not limited to: allergic reactions (i.e.: anaphylactic or anaphylactoid reaction(s)); adrenal axis suppression; blood sugar elevation that in diabetics may result in ketoacidosis or comma; water retention that in patients with history of congestive heart failure may result in shortness of breath, pulmonary edema, and decompensation with resultant heart  failure; weight gain; swelling or edema; medication-induced neural toxicity; particulate matter embolism and blood vessel occlusion with resultant organ, and/or nervous system infarction; and/or aseptic necrosis of one or more joints. Finally, the patient was informed that Medicine is not an exact science; therefore, there is also the possibility of unforeseen or unpredictable risks and/or possible complications that may result in a catastrophic outcome. The patient indicated having understood very clearly. We have given the patient no guarantees and we have made no promises. Enough time was given to the patient to ask questions, all of which were answered to the patient's satisfaction. Todd Bennett has indicated that he wanted to continue with the procedure. Attestation: I, the ordering provider, attest that I have discussed with the patient the benefits, risks, side-effects, alternatives, likelihood of achieving goals, and potential problems during recovery for the procedure that I have provided informed consent. Date  Time: 05/16/2023 10:10 AM   Pre-Procedure Preparation:  Monitoring: As per clinic protocol. Respiration, ETCO2, SpO2, BP, heart rate and rhythm monitor placed and checked for adequate function Safety Precautions: Patient was assessed for positional comfort and pressure points before starting the procedure. Time-out: I initiated and conducted the "Time-out" before starting the procedure, as per protocol. The patient was asked to participate by confirming the accuracy of the "Time Out" information. Verification of the correct person, site, and procedure were performed and confirmed by me, the nursing staff, and the patient. "Time-out" conducted as per Joint Commission's Universal Protocol (UP.01.01.01). Time: 1133 Start Time: 1134 hrs.  Description/Narrative of Procedure:          Target: Epidural space via interlaminar opening, initially targeting the lower laminar border of the superior  vertebral body. Region: Lumbar Approach: Percutaneous paravertebral  Rationale (medical necessity): procedure needed and proper for the diagnosis and/or treatment of the patient's medical symptoms and needs. Procedural Technique Safety Precautions: Aspiration looking for blood return was conducted prior to all injections. At no point did we inject any substances, as a needle was being advanced. No attempts  were made at seeking any paresthesias. Safe injection practices and needle disposal techniques used. Medications properly checked for expiration dates. SDV (single dose vial) medications used. Description of the Procedure: Protocol guidelines were followed. The procedure needle was introduced through the skin, ipsilateral to the reported pain, and advanced to the target area. Bone was contacted and the needle walked caudad, until the lamina was cleared. The epidural space was identified using "loss-of-resistance technique" with 2-3 ml of PF-NaCl (0.9% NSS), in a 5cc LOR glass syringe.  Vitals:   05/16/23 1018 05/16/23 1134 05/16/23 1140  BP: (!) 148/64 (!) 190/113 (!) 177/75  Pulse: (!) 59 65 65  Resp:  18 20  Temp: 97.9 F (36.6 C)    SpO2: 96% 94% 96%  Weight: 160 lb (72.6 kg)    Height: 5\' 11"  (1.803 m)      Start Time: 1134 hrs. End Time: 1138 hrs.  Imaging Guidance (Spinal):          Type of Imaging Technique: Fluoroscopy Guidance (Spinal) Indication(s): Fluoroscopy guidance for needle placement to enhance accuracy in procedures requiring precise needle localization for targeted delivery of medication in or near specific anatomical locations not easily accessible without such real-time imaging assistance. Exposure Time: Please see nurses notes. Contrast: Before injecting any contrast, we confirmed that the patient did not have an allergy to iodine, shellfish, or radiological contrast. Once satisfactory needle placement was completed at the desired level, radiological contrast was  injected. Contrast injected under live fluoroscopy. No contrast complications. See chart for type and volume of contrast used. Fluoroscopic Guidance: I was personally present during the use of fluoroscopy. "Tunnel Vision Technique" used to obtain the best possible view of the target area. Parallax error corrected before commencing the procedure. "Direction-depth-direction" technique used to introduce the needle under continuous pulsed fluoroscopy. Once target was reached, antero-posterior, oblique, and lateral fluoroscopic projection used confirm needle placement in all planes. Images permanently stored in EMR. Interpretation: I personally interpreted the imaging intraoperatively. Adequate needle placement confirmed in multiple planes. Appropriate spread of contrast into desired area was observed. No evidence of afferent or efferent intravascular uptake. No intrathecal or subarachnoid spread observed. Permanent images saved into the patient's record.  Antibiotic Prophylaxis:   Anti-infectives (From admission, onward)    None      Indication(s): None identified  Post-operative Assessment:  Post-procedure Vital Signs:  Pulse/HCG Rate: 65  Temp: 97.9 F (36.6 C) Resp: 20 BP: (!) 177/75 SpO2: 96 %  EBL: None  Complications: No immediate post-treatment complications observed by team, or reported by patient.  Note: The patient tolerated the entire procedure well. A repeat set of vitals were taken after the procedure and the patient was kept under observation following institutional policy, for this type of procedure. Post-procedural neurological assessment was performed, showing return to baseline, prior to discharge. The patient was provided with post-procedure discharge instructions, including a section on how to identify potential problems. Should any problems arise concerning this procedure, the patient was given instructions to immediately contact us , at any time, without hesitation. In any  case, we plan to contact the patient by telephone for a follow-up status report regarding this interventional procedure.  Comments:  No additional relevant information.  Plan of Care (POC)  Orders:  Orders Placed This Encounter  Procedures   DG PAIN CLINIC C-ARM 1-60 MIN NO REPORT    Intraoperative interpretation by procedural physician at [facility].    Standing Status:   Standing    Number  of Occurrences:   1    Reason for exam::   Assistance in needle guidance and placement for procedures requiring needle placement in or near specific anatomical locations not easily accessible without such assistance.     Medications ordered for procedure: Meds ordered this encounter  Medications   iohexol  (OMNIPAQUE ) 180 MG/ML injection 10 mL    Must be Myelogram-compatible. If not available, you may substitute with a water-soluble, non-ionic, hypoallergenic, myelogram-compatible radiological contrast medium.   lidocaine  (XYLOCAINE ) 2 % (with pres) injection 400 mg   ropivacaine  (PF) 2 mg/mL (0.2%) (NAROPIN ) injection 2 mL   sodium chloride  flush (NS) 0.9 % injection 2 mL   dexamethasone  (DECADRON ) injection 10 mg   Medications administered: We administered iohexol , lidocaine , ropivacaine  (PF) 2 mg/mL (0.2%), sodium chloride  flush, and dexamethasone .  See the medical record for exact dosing, route, and time of administration.  Follow-up plan:   Return in about 3 weeks (around 06/06/2023) for PPE, F2F.       Recent Visits Date Type Provider Dept  03/30/23 Office Visit Cephus Collin, MD [facility]  Showing recent visits within past 90 days and meeting all other requirements Today's Visits Date Type Provider Dept  05/16/23 Procedure visit Cephus Collin, MD [facility]  Showing today's visits and meeting all other requirements Future Appointments No visits were found meeting these conditions. Showing future appointments within next 90 days and meeting all  other requirements  Disposition: Discharge home  Discharge (Date  Time): 05/16/2023;   hrs.   Primary Care Physician: Rory Collard, MD Location: [facility] Note by: Cephus Collin, MD (TTS technology used. I apologize for any typographical errors that were not detected and corrected.) Date: 05/16/2023; Time: 11:45 AM  Disclaimer:  Medicine is not an Visual merchandiser. The only guarantee in medicine is that nothing is guaranteed. It is important to note that the decision to proceed with this intervention was based on the information collected from the patient. The Data and conclusions were drawn from the patient's questionnaire, the interview, and the physical examination. Because the information was provided in large part by the patient, it cannot be guaranteed that it has not been purposely or unconsciously manipulated. Every effort has been made to obtain as much relevant data as possible for this evaluation. It is important to note that the conclusions that lead to this procedure are derived in large part from the available data. Always take into account that the treatment will also be dependent on availability of resources and existing treatment guidelines, considered by other Pain Management Practitioners as being common knowledge and practice, at the time of the intervention. For Medico-Legal purposes, it is also important to point out that variation in procedural techniques and pharmacological choices are the acceptable norm. The indications, contraindications, technique, and results of the above procedure should only be interpreted and judged by a Board-Certified Interventional Pain Specialist with extensive familiarity and expertise in the same exact procedure and technique.

## 2023-05-17 ENCOUNTER — Telehealth: Payer: Self-pay | Admitting: *Deleted

## 2023-05-17 NOTE — Telephone Encounter (Signed)
 No problems post procedure.

## 2023-05-31 DIAGNOSIS — H189 Unspecified disorder of cornea: Secondary | ICD-10-CM | POA: Diagnosis not present

## 2023-06-01 ENCOUNTER — Emergency Department

## 2023-06-01 ENCOUNTER — Emergency Department
Admission: EM | Admit: 2023-06-01 | Discharge: 2023-06-01 | Disposition: A | Discharge status: Home / Self Care | Attending: Emergency Medicine | Admitting: Emergency Medicine

## 2023-06-01 ENCOUNTER — Other Ambulatory Visit: Payer: Self-pay

## 2023-06-01 DIAGNOSIS — S022XXA Fracture of nasal bones, initial encounter for closed fracture: Secondary | ICD-10-CM | POA: Diagnosis not present

## 2023-06-01 DIAGNOSIS — S0512XA Contusion of eyeball and orbital tissues, left eye, initial encounter: Secondary | ICD-10-CM | POA: Diagnosis not present

## 2023-06-01 DIAGNOSIS — Y92531 Health care provider office as the place of occurrence of the external cause: Secondary | ICD-10-CM | POA: Insufficient documentation

## 2023-06-01 DIAGNOSIS — G319 Degenerative disease of nervous system, unspecified: Secondary | ICD-10-CM | POA: Diagnosis not present

## 2023-06-01 DIAGNOSIS — M47812 Spondylosis without myelopathy or radiculopathy, cervical region: Secondary | ICD-10-CM | POA: Insufficient documentation

## 2023-06-01 DIAGNOSIS — S0031XA Abrasion of nose, initial encounter: Secondary | ICD-10-CM | POA: Insufficient documentation

## 2023-06-01 DIAGNOSIS — J3489 Other specified disorders of nose and nasal sinuses: Secondary | ICD-10-CM | POA: Diagnosis not present

## 2023-06-01 DIAGNOSIS — W010XXA Fall on same level from slipping, tripping and stumbling without subsequent striking against object, initial encounter: Secondary | ICD-10-CM | POA: Diagnosis not present

## 2023-06-01 DIAGNOSIS — I6782 Cerebral ischemia: Secondary | ICD-10-CM | POA: Diagnosis not present

## 2023-06-01 DIAGNOSIS — S0993XA Unspecified injury of face, initial encounter: Secondary | ICD-10-CM

## 2023-06-01 DIAGNOSIS — S0990XA Unspecified injury of head, initial encounter: Secondary | ICD-10-CM | POA: Diagnosis not present

## 2023-06-01 DIAGNOSIS — W19XXXA Unspecified fall, initial encounter: Secondary | ICD-10-CM

## 2023-06-01 DIAGNOSIS — S00212A Abrasion of left eyelid and periocular area, initial encounter: Secondary | ICD-10-CM | POA: Insufficient documentation

## 2023-06-01 NOTE — ED Provider Notes (Signed)
 [facility] Provider Note    Event Date/Time   First MD Initiated Contact with Patient 06/01/23 1536     (approximate)   History   Facial Injury and Fall   HPI Todd Bennett is a 89 y.o. male presenting today for fall with facial injury.  Patient states he has had 2 falls over the past couple of days.  The first 1 was when he tripped over a rug at his eye clinic office.  The second was seen he was stepping up onto a curb and missed the step causing him to fall.  Notes injury to the front of his face.  Denies loss of consciousness.  Not on any blood thinners.  Denies injury elsewhere.  Does state that he has a rollator which she is supposed to use but mostly uses a cane.     Physical Exam   Triage Vital Signs: ED Triage Vitals  Encounter Vitals Group     BP 06/01/23 1245 (!) 107/48     Systolic BP Percentile --      Diastolic BP Percentile --      Pulse Rate 06/01/23 1245 60     Resp 06/01/23 1245 20     Temp 06/01/23 1245 98 F (36.7 C)     Temp Source 06/01/23 1245 Oral     SpO2 06/01/23 1245 95 %     Weight 06/01/23 1246 158 lb 11.7 oz (72 kg)     Height 06/01/23 1246 5\' 11"  (1.803 m)     Head Circumference --      Peak Flow --      Pain Score 06/01/23 1245 1     Pain Loc --      Pain Education --      Exclude from Growth Chart --     Most recent vital signs: Vitals:   06/01/23 1245  BP: (!) 107/48  Pulse: 60  Resp: 20  Temp: 98 F (36.7 C)  SpO2: 95%   I have reviewed the vital signs. General:  Awake, alert, no acute distress. Head:  Normocephalic, abrasions to the bridge of the nose and over the left eyebrow, no lacerations. EENT:  PERRL, EOMI, Oral mucosa pink and moist, Neck is supple. Cardiovascular: Regular rate, 2+ distal pulses. Respiratory:  Normal respiratory effort, symmetrical expansion, no distress.   Extremities:  Moving all four extremities through full ROM without pain.   Neuro:  Alert and oriented.  Interacting  appropriately.   Skin:  Warm, dry, no rash.   Psych: Appropriate affect.    ED Results / Procedures / Treatments   Labs (all labs ordered are listed, but only abnormal results are displayed) Labs Reviewed - No data to display   EKG    RADIOLOGY Independently interpreted CT head, C-spine, and CT orbits with no acute traumatic injuries   PROCEDURES:  Critical Care performed: No  Procedures   MEDICATIONS ORDERED IN ED: Medications - No data to display   IMPRESSION / MDM / ASSESSMENT AND PLAN / ED COURSE  I reviewed the triage vital signs and the nursing notes.                              Differential diagnosis includes, but is not limited to, ICH, facial abrasions, cervical spine injury, facial bone fracture  Patient's presentation is most consistent with acute complicated illness / injury requiring diagnostic workup.  Patient is an  89 year old male presenting today for ground-level fall with head and face injury.  Does have abrasions over the bridge of his nose but no other lacerations.  Vital signs otherwise stable.  Denies pain symptoms elsewhere.  CT imaging ordered in triage of the head, orbits, and cervical spine showed no acute traumatic injuries.  Patient does state he is supposed to use a rollator but only been using a cane recently.  I did recommend him go back to use his rollator over the next couple weeks until his strength is better.  Also told him to follow-up with physical therapy at his living facility as needed.     FINAL CLINICAL IMPRESSION(S) / ED DIAGNOSES   Final diagnoses:  Fall, initial encounter  Facial injury, initial encounter     Rx / DC Orders   ED Discharge Orders     None        Note:  This document was prepared using Dragon voice recognition software and may include unintentional dictation errors.   Kandee Orion, MD 06/01/23 [phone]

## 2023-06-01 NOTE — Discharge Instructions (Signed)
 CT imaging of your head, face, and neck showed no acute fractures.  You can use soap and water for the abrasions to your face.  Please follow-up with your facility to have physical therapy see you over the next couple weeks as needed.  I would also recommend using your rollator instead of the cane for the next couple weeks until your balance is better.

## 2023-06-01 NOTE — ED Triage Notes (Signed)
 Pt sent from [facility] clinic with c/o of 2 falls in the past 3 days, pt states he has tripped twice today while at the eye doctors office.  Pt has a abrasion to nose and bruising to L eyebrow area. Pt denies LOC, pt denies dizziness prior to fall and endorses "I just tripped over the rug".   Pt denies blood thinner use.

## 2023-06-06 ENCOUNTER — Ambulatory Visit: Admitting: Student in an Organized Health Care Education/Training Program

## 2023-06-09 ENCOUNTER — Ambulatory Visit: Admitting: Student in an Organized Health Care Education/Training Program

## 2023-06-09 DIAGNOSIS — L570 Actinic keratosis: Secondary | ICD-10-CM | POA: Diagnosis not present

## 2023-06-15 DIAGNOSIS — R296 Repeated falls: Secondary | ICD-10-CM | POA: Diagnosis not present

## 2023-06-15 DIAGNOSIS — M5441 Lumbago with sciatica, right side: Secondary | ICD-10-CM | POA: Diagnosis not present

## 2023-06-15 DIAGNOSIS — M5442 Lumbago with sciatica, left side: Secondary | ICD-10-CM | POA: Diagnosis not present

## 2023-06-15 DIAGNOSIS — I2510 Atherosclerotic heart disease of native coronary artery without angina pectoris: Secondary | ICD-10-CM | POA: Diagnosis not present

## 2023-06-16 ENCOUNTER — Ambulatory Visit
Attending: Student in an Organized Health Care Education/Training Program | Admitting: Student in an Organized Health Care Education/Training Program

## 2023-06-16 ENCOUNTER — Encounter: Payer: Self-pay | Admitting: Student in an Organized Health Care Education/Training Program

## 2023-06-16 VITALS — BP 143/66 | HR 65 | Temp 98.4°F | Ht 71.0 in | Wt 160.0 lb

## 2023-06-16 DIAGNOSIS — G8929 Other chronic pain: Secondary | ICD-10-CM | POA: Diagnosis not present

## 2023-06-16 DIAGNOSIS — M47816 Spondylosis without myelopathy or radiculopathy, lumbar region: Secondary | ICD-10-CM | POA: Diagnosis not present

## 2023-06-16 DIAGNOSIS — M5416 Radiculopathy, lumbar region: Secondary | ICD-10-CM | POA: Diagnosis not present

## 2023-06-16 DIAGNOSIS — M4726 Other spondylosis with radiculopathy, lumbar region: Secondary | ICD-10-CM

## 2023-06-16 DIAGNOSIS — G894 Chronic pain syndrome: Secondary | ICD-10-CM | POA: Diagnosis not present

## 2023-06-16 DIAGNOSIS — M48062 Spinal stenosis, lumbar region with neurogenic claudication: Secondary | ICD-10-CM | POA: Diagnosis not present

## 2023-06-16 NOTE — Progress Notes (Signed)
 PROVIDER NOTE: Interpretation of information contained herein should be left to medically-trained personnel. Specific patient instructions are provided elsewhere under "Patient Instructions" section of medical record. This document was created in part using AI and STT-dictation technology, any transcriptional errors that may result from this process are unintentional.  Patient: Todd Bennett  Service: E/M   PCP: Rory Collard, MD  DOB: [birth year]  DOS: 06/16/2023  Provider: Cephus Collin, MD  MRN: [id number]  Delivery: Face-to-face  Specialty: Interventional Pain Management  Type: Established Patient  Setting: Ambulatory outpatient facility  Specialty designation: 09  Referring Prov.: Rory Collard, MD  Location: Outpatient office facility       HPI  Todd Bennett, a 89 y.o. year old male, is here today because of his Spinal stenosis, lumbar region, with neurogenic claudication [M48.062]. Todd Bennett primary complain today is Other (thigh)  Pain Assessment: Severity of Chronic pain is reported as a 5 /10. Location:  (thigh) Right, Left/pain radiaities from hip to back of his knee. Onset: More than a month ago. Quality: Aching, Constant, Discomfort. Timing: Constant. Modifying factor(s): sitting, meds. Vitals:  height is 5\' 11"  (1.803 m) and weight is 160 lb (72.6 kg). His temperature is 98.4 F (36.9 C). His blood pressure is 143/66 (abnormal) and his pulse is 65. His oxygen saturation is 97%.  BMI: Estimated body mass index is 22.32 kg/m as calculated from the following:   Height as of this encounter: 5\' 11"  (1.803 m).   Weight as of this encounter: 160 lb (72.6 kg). Last encounter: 03/30/2023. Last procedure: 05/16/2023.  Reason for encounter:    Post-procedure evaluation  Type: Lumbar epidural steroid injection (LESI) (interlaminar) #2    Laterality: Midline   Level:  L4-5 Level.  Imaging: Fluoroscopic guidance         Anesthesia: Local anesthesia (1-2% Lidocaine ) DOS: 05/16/2023   Performed by: Cephus Collin, MD  Purpose: Diagnostic/Therapeutic Indications: Lumbar radicular pain of intraspinal etiology of more than 4 weeks that has failed to respond to conservative therapy and is severe enough to impact quality of life or function. 1. Spinal stenosis, lumbar region, with neurogenic claudication   2. Chronic radicular lumbar pain   3. Chronic pain syndrome    NAS-11 Pain score:   Pre-procedure: 8 /10   Post-procedure: 0-No pain/10    Effectiveness:  Initial hour after procedure: 100 %  Subsequent 4-6 hours post-procedure: 100 %  Analgesia past initial 6 hours: 100 %  Ongoing improvement:  Analgesic:  100% Function: Todd Bennett reports improvement in function ROM: Todd Bennett reports improvement in ROM  ROS  Constitutional: Denies any fever or chills Gastrointestinal: No reported hemesis, hematochezia, vomiting, or acute GI distress Musculoskeletal: Denies any acute onset joint swelling, redness, loss of ROM, or weakness Neurological: difficulty ambulating   Medication Review  Cholecalciferol , FLUoxetine , Fish Oil, Multi-Vitamins, acetaminophen , aspirin  EC, atorvastatin , celecoxib , finasteride , losartan , metoprolol  tartrate, metroNIDAZOLE , mirtazapine , nitroGLYCERIN , omeprazole, tamsulosin , venlafaxine  XR, and vitamin C   History Review  Allergy: Todd Bennett is allergic to ciprofloxacin, clarithromycin, elemental sulfur, oxycodone, and penicillins. Drug: Todd Bennett  reports no history of drug use. Alcohol:  reports no history of alcohol use. Tobacco:  reports that he has quit smoking. He has never used smokeless tobacco. Social: Todd Bennett  reports that he has quit smoking. He has never used smokeless tobacco. He reports that he does not drink alcohol and does not use drugs. Medical:  has a past medical history of Actinic keratosis, Allergy, Anxiety (06/17/2014), Basal cell  carcinoma (06/24/2009), Basal cell carcinoma (09/11/2012), Basal cell carcinoma (02/19/2021),  Benign prostatic hyperplasia with urinary obstruction (06/17/2014), Benign prostatic hypertrophy without urinary obstruction (06/20/2013), Bladder calculi (04/26/2013), Calculi, ureter (03/29/2013), Calculus of kidney (02/25/2013), Cataract, Clinical depression (06/17/2014), Degenerative disc disease, lumbar, Frank hematuria (06/28/2013), HLD (hyperlipidemia) (06/17/2014), squamous cell carcinoma, Spondylolysis, Squamous cell carcinoma of skin (09/11/2012), Squamous cell carcinoma of skin (03/10/2015), and Squamous cell carcinoma of skin (07/26/2016). Surgical: Todd Bennett  has a past surgical history that includes Ileostomy; Lithotripsy (03/2013); Hernia repair (1993); LEFT HEART CATH AND CORONARY ANGIOGRAPHY (N/A, 03/15/2017); CORONARY STENT INTERVENTION (N/A, 03/15/2017); IR Angiogram Extremity Left (02/10/2018); IR Angiogram Selective Each Additional Vessel (02/10/2018); IR Angiogram Selective Each Additional Vessel (02/10/2018); IR US  Guide Vasc Access Right (02/10/2018); IR EMBO VENOUS NOT HEMORR HEMANG  INC GUIDE ROADMAPPING (02/10/2018); and IR Angiogram Selective Each Additional Vessel (02/10/2018). Family: family history includes Diabetes in his mother.  Laboratory Chemistry Profile   Renal Lab Results  Component Value Date   BUN 20 09/03/2021   CREATININE 0.90 09/03/2021   BCR 17 10/13/2017   GFRAA >60 02/12/2018   GFRNONAA >60 09/03/2021    Hepatic Lab Results  Component Value Date   AST 25 03/14/2017   ALT 23 03/14/2017   ALBUMIN 3.9 03/14/2017   ALKPHOS 52 03/14/2017   LIPASE 39 03/14/2017    Electrolytes Lab Results  Component Value Date   NA 139 09/03/2021   K 4.0 09/03/2021   CL 109 09/03/2021   CALCIUM  9.1 09/03/2021    Bone No results found for: "VD25OH", "VD125OH2TOT", "WU9811BJ4", "NW2956OZ3", "25OHVITD1", "25OHVITD2", "25OHVITD3", "TESTOFREE", "TESTOSTERONE"  Inflammation (CRP: Acute Phase) (ESR: Chronic Phase) No results found for: "CRP", "ESRSEDRATE", "LATICACIDVEN"        Note: Above Lab results reviewed.  Recent Imaging Review  CT Cervical Spine Wo Contrast CLINICAL DATA:  Fall. Orbital trauma. Additional history provided: Abrasion to nose, left periorbital ecchymosis.  EXAM: CT HEAD WITHOUT CONTRAST  CT ORBITS WITHOUT CONTRAST  CT CERVICAL SPINE WITHOUT CONTRAST  TECHNIQUE: Multidetector CT imaging of the head, cervical spine, and orbits was performed using the standard protocol without intravenous contrast. Multiplanar CT image reconstructions of the cervical spine and orbital structures were also generated.  RADIATION DOSE REDUCTION: This exam was performed according to the departmental dose-optimization program which includes automated exposure control, adjustment of the mA and/or kV according to patient size and/or use of iterative reconstruction technique.  COMPARISON:  Brain MRI 03/24/2020. Head CT 02/09/2018.  FINDINGS: CT HEAD FINDINGS  Brain:  Prominence of the extra-axial CSF spaces along the bilateral cerebral convexities, unchanged from the prior brain MRI of 03/24/2020 and attributed to cerebral atrophy when correlating with this prior MRI.  Patchy and ill-defined hypoattenuation within the cerebral white matter, nonspecific but compatible with moderate chronic small vessel ischemic disease.  There is no acute intracranial hemorrhage.  No demarcated cortical infarct.  No extra-axial fluid collection.  No evidence of an intracranial mass.  No midline shift.  Vascular: No hyperdense vessel.  Atherosclerotic calcifications.  Skull: No calvarial fracture or aggressive osseous lesion.  CT ORBITS FINDINGS  Osseous: No acute fracture is identified. Chronic, mildly displaced fracture of the left nasal bone. No acute orbital fracture is identified.  Orbits: Chronic right globe calcifications again demonstrated. No finding within the orbits.  Sinuses: Minimal mucosal thickening within the bilateral ethmoid  and left maxillary sinuses at the imaged levels.  Soft tissues: No appreciable periorbital or facial hematoma at the imaged levels.  CT CERVICAL SPINE  FINDINGS  Alignment: Dextrocurvature of the cervical spine. 2 mm grade 1 anterolisthesis at C2-C3, C3-C4 and C6-C7. 3 mm C7-T1 grade 1 anterolisthesis.  Skull base and vertebrae: The basion-dental and atlanto-dental intervals are maintained.No evidence of acute fracture to the cervical spine.  Soft tissues and spinal canal: No prevertebral fluid or swelling. No visible canal hematoma.  Disc levels:  Cervical spondylosis with multilevel disc space narrowing, disc bulges/central disc protrusions, posterior disc osteophyte complexes, uncovertebral hypertrophy and facet arthropathy. Disc space narrowing is greatest at C3-C4 (moderate-to-advanced), C4-C5 (advanced) and C5-C6 (advanced). Multilevel spinal canal stenosis. Most notably at C4-C5, a posterior disc osteophyte complex contributes to at least moderate spinal canal stenosis. Multilevel bony neural foraminal narrowing. Multilevel ventral osteophytes. Degenerative changes also present at the C1-C2 articulation.  Upper chest: No consolidation within the imaged lung apices. No visible pneumothorax. Emphysema. Right apical pleuroparenchymal scarring.  IMPRESSION: CT head:  1.  No evidence of an acute intracranial abnormality. 2. Parenchymal atrophy and chronic small vessel ischemic disease.  CT orbits:  1. No orbital fracture or acute orbital finding. 2. Chronic, mildly displaced fracture of the left nasal bone. 3. Chronic right globe calcifications. 4. Minor paranasal sinus mucosal thickening at the imaged levels.  CT cervical spine:  1. No evidence of an acute cervical spine fracture. 2. Dextrocurvature of the cervical spine. 3. Grade 1 anterolisthesis at C2-C3, C3-C4, C6-C7 and C7-T1. 4. Cervical spondylosis as described within the body of the report. Multilevel  spinal canal narrowing. Most notably at C4-C5, a posterior disc osteophyte complex contributes to at least moderate spinal canal stenosis. Multilevel bony neural foraminal narrowing.  Electronically Signed   By: Bascom Lily D.O.   On: 06/01/2023 14:56 CT HEAD WO CONTRAST ( ) CLINICAL DATA:  Fall. Orbital trauma. Additional history provided: Abrasion to nose, left periorbital ecchymosis.  EXAM: CT HEAD WITHOUT CONTRAST  CT ORBITS WITHOUT CONTRAST  CT CERVICAL SPINE WITHOUT CONTRAST  TECHNIQUE: Multidetector CT imaging of the head, cervical spine, and orbits was performed using the standard protocol without intravenous contrast. Multiplanar CT image reconstructions of the cervical spine and orbital structures were also generated.  RADIATION DOSE REDUCTION: This exam was performed according to the departmental dose-optimization program which includes automated exposure control, adjustment of the mA and/or kV according to patient size and/or use of iterative reconstruction technique.  COMPARISON:  Brain MRI 03/24/2020. Head CT 02/09/2018.  FINDINGS: CT HEAD FINDINGS  Brain:  Prominence of the extra-axial CSF spaces along the bilateral cerebral convexities, unchanged from the prior brain MRI of 03/24/2020 and attributed to cerebral atrophy when correlating with this prior MRI.  Patchy and ill-defined hypoattenuation within the cerebral white matter, nonspecific but compatible with moderate chronic small vessel ischemic disease.  There is no acute intracranial hemorrhage.  No demarcated cortical infarct.  No extra-axial fluid collection.  No evidence of an intracranial mass.  No midline shift.  Vascular: No hyperdense vessel.  Atherosclerotic calcifications.  Skull: No calvarial fracture or aggressive osseous lesion.  CT ORBITS FINDINGS  Osseous: No acute fracture is identified. Chronic, mildly displaced fracture of the left nasal bone. No acute orbital  fracture is identified.  Orbits: Chronic right globe calcifications again demonstrated. No finding within the orbits.  Sinuses: Minimal mucosal thickening within the bilateral ethmoid and left maxillary sinuses at the imaged levels.  Soft tissues: No appreciable periorbital or facial hematoma at the imaged levels.  CT CERVICAL SPINE FINDINGS  Alignment: Dextrocurvature of the cervical spine. 2 mm grade 1 anterolisthesis  at C2-C3, C3-C4 and C6-C7. 3 mm C7-T1 grade 1 anterolisthesis.  Skull base and vertebrae: The basion-dental and atlanto-dental intervals are maintained.No evidence of acute fracture to the cervical spine.  Soft tissues and spinal canal: No prevertebral fluid or swelling. No visible canal hematoma.  Disc levels:  Cervical spondylosis with multilevel disc space narrowing, disc bulges/central disc protrusions, posterior disc osteophyte complexes, uncovertebral hypertrophy and facet arthropathy. Disc space narrowing is greatest at C3-C4 (moderate-to-advanced), C4-C5 (advanced) and C5-C6 (advanced). Multilevel spinal canal stenosis. Most notably at C4-C5, a posterior disc osteophyte complex contributes to at least moderate spinal canal stenosis. Multilevel bony neural foraminal narrowing. Multilevel ventral osteophytes. Degenerative changes also present at the C1-C2 articulation.  Upper chest: No consolidation within the imaged lung apices. No visible pneumothorax. Emphysema. Right apical pleuroparenchymal scarring.  IMPRESSION: CT head:  1.  No evidence of an acute intracranial abnormality. 2. Parenchymal atrophy and chronic small vessel ischemic disease.  CT orbits:  1. No orbital fracture or acute orbital finding. 2. Chronic, mildly displaced fracture of the left nasal bone. 3. Chronic right globe calcifications. 4. Minor paranasal sinus mucosal thickening at the imaged levels.  CT cervical spine:  1. No evidence of an acute cervical spine  fracture. 2. Dextrocurvature of the cervical spine. 3. Grade 1 anterolisthesis at C2-C3, C3-C4, C6-C7 and C7-T1. 4. Cervical spondylosis as described within the body of the report. Multilevel spinal canal narrowing. Most notably at C4-C5, a posterior disc osteophyte complex contributes to at least moderate spinal canal stenosis. Multilevel bony neural foraminal narrowing.  Electronically Signed   By: Bascom Lily D.O.   On: 06/01/2023 14:56 CT Orbits Wo Contrast CLINICAL DATA:  Fall. Orbital trauma. Additional history provided: Abrasion to nose, left periorbital ecchymosis.  EXAM: CT HEAD WITHOUT CONTRAST  CT ORBITS WITHOUT CONTRAST  CT CERVICAL SPINE WITHOUT CONTRAST  TECHNIQUE: Multidetector CT imaging of the head, cervical spine, and orbits was performed using the standard protocol without intravenous contrast. Multiplanar CT image reconstructions of the cervical spine and orbital structures were also generated.  RADIATION DOSE REDUCTION: This exam was performed according to the departmental dose-optimization program which includes automated exposure control, adjustment of the mA and/or kV according to patient size and/or use of iterative reconstruction technique.  COMPARISON:  Brain MRI 03/24/2020. Head CT 02/09/2018.  FINDINGS: CT HEAD FINDINGS  Brain:  Prominence of the extra-axial CSF spaces along the bilateral cerebral convexities, unchanged from the prior brain MRI of 03/24/2020 and attributed to cerebral atrophy when correlating with this prior MRI.  Patchy and ill-defined hypoattenuation within the cerebral white matter, nonspecific but compatible with moderate chronic small vessel ischemic disease.  There is no acute intracranial hemorrhage.  No demarcated cortical infarct.  No extra-axial fluid collection.  No evidence of an intracranial mass.  No midline shift.  Vascular: No hyperdense vessel.  Atherosclerotic calcifications.  Skull: No  calvarial fracture or aggressive osseous lesion.  CT ORBITS FINDINGS  Osseous: No acute fracture is identified. Chronic, mildly displaced fracture of the left nasal bone. No acute orbital fracture is identified.  Orbits: Chronic right globe calcifications again demonstrated. No finding within the orbits.  Sinuses: Minimal mucosal thickening within the bilateral ethmoid and left maxillary sinuses at the imaged levels.  Soft tissues: No appreciable periorbital or facial hematoma at the imaged levels.  CT CERVICAL SPINE FINDINGS  Alignment: Dextrocurvature of the cervical spine. 2 mm grade 1 anterolisthesis at C2-C3, C3-C4 and C6-C7. 3 mm C7-T1 grade 1 anterolisthesis.  Skull base  and vertebrae: The basion-dental and atlanto-dental intervals are maintained.No evidence of acute fracture to the cervical spine.  Soft tissues and spinal canal: No prevertebral fluid or swelling. No visible canal hematoma.  Disc levels:  Cervical spondylosis with multilevel disc space narrowing, disc bulges/central disc protrusions, posterior disc osteophyte complexes, uncovertebral hypertrophy and facet arthropathy. Disc space narrowing is greatest at C3-C4 (moderate-to-advanced), C4-C5 (advanced) and C5-C6 (advanced). Multilevel spinal canal stenosis. Most notably at C4-C5, a posterior disc osteophyte complex contributes to at least moderate spinal canal stenosis. Multilevel bony neural foraminal narrowing. Multilevel ventral osteophytes. Degenerative changes also present at the C1-C2 articulation.  Upper chest: No consolidation within the imaged lung apices. No visible pneumothorax. Emphysema. Right apical pleuroparenchymal scarring.  IMPRESSION: CT head:  1.  No evidence of an acute intracranial abnormality. 2. Parenchymal atrophy and chronic small vessel ischemic disease.  CT orbits:  1. No orbital fracture or acute orbital finding. 2. Chronic, mildly displaced fracture of the left  nasal bone. 3. Chronic right globe calcifications. 4. Minor paranasal sinus mucosal thickening at the imaged levels.  CT cervical spine:  1. No evidence of an acute cervical spine fracture. 2. Dextrocurvature of the cervical spine. 3. Grade 1 anterolisthesis at C2-C3, C3-C4, C6-C7 and C7-T1. 4. Cervical spondylosis as described within the body of the report. Multilevel spinal canal narrowing. Most notably at C4-C5, a posterior disc osteophyte complex contributes to at least moderate spinal canal stenosis. Multilevel bony neural foraminal narrowing.  Electronically Signed   By: Bascom Lily D.O.   On: 06/01/2023 14:56   CLINICAL DATA:  Low back pain and neurogenic claudication.   EXAM: MRI LUMBAR SPINE WITHOUT CONTRAST   TECHNIQUE: Multiplanar, multisequence MR imaging of the lumbar spine was performed. No intravenous contrast was administered.   COMPARISON:  MRI from 2018   FINDINGS: Segmentation: There are five lumbar type vertebral bodies. The last full intervertebral disc space is labeled L5-S1. This correlates with the prior study.   Alignment:  Stable degenerative anterolisthesis of L4.   Vertebrae: Normal marrow signal. No bone lesions or fractures. Mild endplate reactive changes.   Conus medullaris and cauda equina: Conus extends to the T12-L1 level. Conus and cauda equina appear normal.   Paraspinal and other soft tissues: No significant paraspinal or retroperitoneal findings.   Disc levels:   T12-L1: Mild facet disease but no spinal or foraminal stenosis.   L1-2: Advanced and progressive degenerative disc disease with a bulging degenerated annulus and osteophytic ridging. There is also a left paracentral and medial foraminal disc osteophyte complex contributing to moderate left lateral recess stenosis and left foraminal stenosis.   L2-3: Bulging annulus, facet disease and ligamentum flavum thickening contributing to early/mild spinal and bilateral  lateral recess stenosis. No foraminal stenosis.   L3-4: Bulging degenerated annulus and moderate facet disease contributing to mild to moderate bilateral lateral recess stenosis. There is also a shallow broad-based left foraminal disc osteophyte complex slightly displacing and potentially irritating the left extraforaminal L3 nerve root.   L4-5: Degenerative anterolisthesis of L4 with a bulging uncovered disc. This in combination with short pedicles, severe facet disease and ligamentum flavum thickening contributes to severe spinal and bilateral lateral recess stenosis similar to the prior study. There is also mild right foraminal stenosis. No left foraminal stenosis.   L5-S1: Mild bulging annulus and moderate facet disease but no disc protrusions, spinal or foraminal stenosis.   IMPRESSION: 1. Advanced and progressive degenerative disc disease at L1-2 with a bulging degenerated annulus and  osteophytic ridging. There is also a left paracentral and medial foraminal disc osteophyte complex contributing to moderate left lateral recess stenosis and left foraminal stenosis. 2. Early/mild spinal and bilateral lateral recess stenosis at L2-3. 3. Mild to moderate bilateral lateral recess stenosis at L3-4. There is also a shallow broad-based left foraminal disc osteophyte complex slightly displacing and potentially irritating the left extraforaminal L3 nerve root. 4. Severe multifactorial spinal and bilateral lateral recess stenosis at L4-5 similar to the prior study. There is also mild right foraminal stenosis.     Electronically Signed   By: Marrian Siva M.D.   On: 05/01/2023 14:15     Note: Reviewed         Physical Exam  General appearance: Well nourished, well developed, and well hydrated. In no apparent acute distress Mental status: Alert, oriented x 3 (person, place, & time)       Respiratory: No evidence of acute respiratory distress Eyes: PERLA Vitals: BP (!) 143/66    Pulse 65   Temp 98.4 F (36.9 C)   Ht 5\' 11"  (1.803 m)   Wt 160 lb (72.6 kg)   SpO2 97%   BMI 22.32 kg/m  BMI: Estimated body mass index is 22.32 kg/m as calculated from the following:   Height as of this encounter: 5\' 11"  (1.803 m).   Weight as of this encounter: 160 lb (72.6 kg). Ideal: Ideal body weight: 75.3 kg (166 lb 0.1 oz)  Lumbar Spine Area Exam  Skin & Axial Inspection: No masses, redness, or swelling Alignment: Symmetrical Functional ROM: Unrestricted ROM       Stability: No instability detected Muscle Tone/Strength: Functionally intact. No obvious neuro-muscular anomalies detected. Sensory (Neurological): Unimpaired Palpation: No palpable anomalies       Provocative Tests: Hyperextension/rotation test: deferred today       Lumbar quadrant test (Kemp's test): deferred today       Lateral bending test: deferred today       Patrick's Maneuver: deferred today                   FABER* test: deferred today                   S-I anterior distraction/compression test: deferred today         S-I lateral compression test: deferred today         S-I Thigh-thrust test: deferred today         S-I Gaenslen's test: deferred today         *(Flexion, ABduction and External Rotation) Gait & Posture Assessment  Ambulation: Unassisted Gait: Relatively normal for age and body habitus Posture: WNL  Lower Extremity Exam    Side: Right lower extremity  Side: Left lower extremity  Stability: No instability observed          Stability: No instability observed          Skin & Extremity Inspection: Skin color, temperature, and hair growth are WNL. No peripheral edema or cyanosis. No masses, redness, swelling, asymmetry, or associated skin lesions. No contractures.  Skin & Extremity Inspection: Skin color, temperature, and hair growth are WNL. No peripheral edema or cyanosis. No masses, redness, swelling, asymmetry, or associated skin lesions. No contractures.  Functional ROM: Unrestricted  ROM                  Functional ROM: Unrestricted ROM  Muscle Tone/Strength: Functionally intact. No obvious neuro-muscular anomalies detected.  Muscle Tone/Strength: Functionally intact. No obvious neuro-muscular anomalies detected.  Sensory (Neurological): Unimpaired        Sensory (Neurological): Unimpaired        DTR: Patellar: deferred today Achilles: deferred today Plantar: deferred today  DTR: Patellar: deferred today Achilles: deferred today Plantar: deferred today  Palpation: No palpable anomalies  Palpation: No palpable anomalies    Assessment   Diagnosis Status  1. Spinal stenosis, lumbar region, with neurogenic claudication   2. Chronic radicular lumbar pain   3. Chronic pain syndrome   4. Lumbar spondylosis   5. Lumbar facet arthropathy    Controlled Controlled Controlled   Updated Problems: No problems updated.  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 Benefits include improved  ambulation, reduced neurogenic claudication, and a minimally invasive alternative to open surgery with shorter recovery time. Risks reviewed include bleeding, infection, dural puncture, nerve injury, allergic reaction to contrast or anesthesia, and lack of symptom relief. The patient demonstrated understanding and expressed interest in proceeding. All questions were answered, and informed consent was obtained.   Orders:  Orders Placed This Encounter  Procedures   Minimally Invasive Lumbar Decompression (MILD)    Standing Status:   Future    Expected Date:   07/13/2023    Expiration Date:   06/15/2024    Scheduling Instructions:     MILD for LSS and Julian    Where will this procedure be performed?:   [facility]   Follow-up plan:   Return in about 27 days (around 07/13/2023) for MILD , ECT (block 90 mins).    Recent Visits Date Type Provider Dept  05/16/23 Procedure visit Cephus Collin, MD [facility]  03/30/23 Office Visit Cephus Collin, MD [facility]  Showing recent visits within past 90 days and meeting all other requirements Today's Visits Date Type Provider Dept  06/16/23 Office Visit Cephus Collin, MD [facility]  Showing today's visits and meeting all other requirements Future Appointments No visits were found meeting these conditions. Showing future appointments within next 90 days and meeting all other requirements   I discussed the assessment and treatment plan with the patient. The patient was provided an opportunity to ask questions and all were answered. The patient agreed with the plan and demonstrated an understanding of the instructions.  Patient advised to call back or seek an in-person evaluation if the symptoms or condition worsens.  Duration of encounter: .  Total time on encounter, as per AMA guidelines included both the face-to-face and non-face-to-face time personally spent by the physician and/or other qualified  health care professional(s) on the day of the encounter (includes time in activities that require the physician or other qualified  health care professional and does not include time in activities normally performed by clinical staff). Physician's time may include the following activities when performed: Preparing to see the patient (e.g., pre-charting review of records, searching for previously ordered imaging, lab work, and nerve conduction tests) Review of prior analgesic pharmacotherapies. Reviewing PMP Interpreting ordered tests (e.g., lab work, imaging, nerve conduction tests) Performing post-procedure evaluations, including interpretation of diagnostic procedures Obtaining and/or reviewing separately obtained history Performing a medically appropriate examination and/or evaluation Counseling and educating the patient/family/caregiver Ordering medications, tests, or procedures Referring and communicating with other health care professionals (when not separately reported) Documenting clinical information in the electronic or other health record Independently interpreting results (not separately reported) and communicating results to the patient/ family/caregiver Care coordination (not separately reported)  Note by: Cephus Collin, MD (TTS and AI technology used. I apologize for any typographical errors that were not detected and corrected.) Date: 06/16/2023; Time: 10:52 AM

## 2023-07-22 ENCOUNTER — Encounter: Payer: Self-pay | Admitting: Podiatry

## 2023-07-22 ENCOUNTER — Ambulatory Visit: Admitting: Podiatry

## 2023-07-22 VITALS — Ht 71.0 in | Wt 160.0 lb

## 2023-07-22 DIAGNOSIS — M79675 Pain in left toe(s): Secondary | ICD-10-CM

## 2023-07-22 DIAGNOSIS — M79674 Pain in right toe(s): Secondary | ICD-10-CM

## 2023-07-22 DIAGNOSIS — B351 Tinea unguium: Secondary | ICD-10-CM

## 2023-07-22 NOTE — Progress Notes (Signed)
   Chief Complaint  Patient presents with   Nail Problem    RFC.    SUBJECTIVE Patient presents to office today complaining of elongated, thickened nails that cause pain while ambulating in shoes.  Patient is unable to trim their own nails. Patient is here for further evaluation and treatment.  Past Medical History:  Diagnosis Date   Actinic keratosis    Allergy    Anxiety 06/17/2014   Basal cell carcinoma 06/24/2009   Left nose sup. nasal alar crease.    Basal cell carcinoma 09/11/2012   Left lateral bicep. Focal sclerosis. Excised: 11/01/2012, margins free.   Basal cell carcinoma 02/19/2021   R preauricular - ED&C   Benign prostatic hyperplasia with urinary obstruction 06/17/2014   Benign prostatic hypertrophy without urinary obstruction 06/20/2013   Bladder calculi 04/26/2013   Calculi, ureter 03/29/2013   Calculus of kidney 02/25/2013   Cataract    Clinical depression 06/17/2014   Degenerative disc disease, lumbar    Frank hematuria 06/28/2013   HLD (hyperlipidemia) 06/17/2014   Hx of squamous cell carcinoma    multiple sites   Spondylolysis    Squamous cell carcinoma of skin 09/11/2012   Right lateral antecubital area. SCCis.    Squamous cell carcinoma of skin 03/10/2015   Right temple hair line. SCCis   Squamous cell carcinoma of skin 07/26/2016   Right temple hair line. SCCis, hypertrophic.    Allergies  Allergen Reactions   Ciprofloxacin Shortness Of Breath and Swelling    Tongue thickened   Clarithromycin Shortness Of Breath and Swelling    Tongue thickened   Elemental Sulfur Shortness Of Breath and Swelling    Tongue thicened   Oxycodone Shortness Of Breath and Swelling    Tongue thickened   Penicillins Shortness Of Breath and Swelling    Tongue thickened and knots on bottom of feet     OBJECTIVE General Patient is awake, alert, and oriented x 3 and in no acute distress. Derm Skin is dry and supple bilateral. Negative open lesions or macerations.  Remaining integument unremarkable. Nails are tender, long, thickened and dystrophic with subungual debris, consistent with onychomycosis, 1-5 bilateral. No signs of infection noted. Vasc  DP and PT pedal pulses palpable bilaterally. Temperature gradient within normal limits.  Neuro Epicritic and protective threshold sensation grossly intact bilaterally.  Musculoskeletal Exam No symptomatic pedal deformities noted bilateral. Muscular strength within normal limits.  ASSESSMENT 1.  Pain due to onychomycosis of toenails both  PLAN OF CARE 1. Patient evaluated today.  2. Instructed to maintain good pedal hygiene and foot care.  3. Mechanical debridement of nails 1-5 bilaterally performed using a nail nipper. Filed with dremel without incident.  4. Return to clinic in 3 mos.    Thresa EMERSON Sar, DPM [facility]  Dr. Thresa EMERSON Sar, DPM    [address] [place], [place] [address]                Office [phone]  Fax [phone]

## 2023-08-15 ENCOUNTER — Ambulatory Visit: Admitting: Student in an Organized Health Care Education/Training Program

## 2023-08-22 ENCOUNTER — Encounter: Payer: Self-pay | Admitting: Student in an Organized Health Care Education/Training Program

## 2023-08-22 ENCOUNTER — Ambulatory Visit (HOSPITAL_BASED_OUTPATIENT_CLINIC_OR_DEPARTMENT_OTHER): Admitting: Student in an Organized Health Care Education/Training Program

## 2023-08-22 ENCOUNTER — Ambulatory Visit
Admission: RE | Admit: 2023-08-22 | Discharge: 2023-08-22 | Disposition: A | Discharge status: Home / Self Care | Source: Ambulatory Visit | Attending: Student in an Organized Health Care Education/Training Program | Admitting: Student in an Organized Health Care Education/Training Program

## 2023-08-22 DIAGNOSIS — M48062 Spinal stenosis, lumbar region with neurogenic claudication: Secondary | ICD-10-CM

## 2023-08-22 DIAGNOSIS — G894 Chronic pain syndrome: Secondary | ICD-10-CM

## 2023-08-22 DIAGNOSIS — Z006 Encounter for examination for normal comparison and control in clinical research program: Secondary | ICD-10-CM | POA: Diagnosis not present

## 2023-08-22 MED ORDER — MIDAZOLAM HCL 5 MG/5ML IJ SOLN
INTRAMUSCULAR | Status: AC
  Filled 2023-08-22: qty 5

## 2023-08-22 MED ORDER — CLINDAMYCIN PHOSPHATE 900 MG/50ML IV SOLN
900.0000 mg | Freq: Once | INTRAVENOUS | Status: AC
  Administered 2023-08-22: 900 mg via INTRAVENOUS
  Filled 2023-08-22: qty 50

## 2023-08-22 MED ORDER — FENTANYL CITRATE (PF) 100 MCG/2ML IJ SOLN
25.0000 ug | INTRAMUSCULAR | Status: DC | PRN
  Administered 2023-08-22: 25 ug via INTRAVENOUS

## 2023-08-22 MED ORDER — LIDOCAINE HCL 2 % IJ SOLN
INTRAMUSCULAR | Status: AC
  Filled 2023-08-22: qty 20

## 2023-08-22 MED ORDER — LIDOCAINE HCL 2 % IJ SOLN: 20.0000 mL | Freq: Once | INTRAMUSCULAR | Status: DC

## 2023-08-22 MED ORDER — MIDAZOLAM HCL 5 MG/5ML IJ SOLN
0.5000 mg | Freq: Once | INTRAMUSCULAR | Status: AC
  Administered 2023-08-22: 1 mg via INTRAVENOUS

## 2023-08-22 MED ORDER — LACTATED RINGERS IV SOLN: Freq: Once | INTRAVENOUS | Status: AC

## 2023-08-22 MED ORDER — FENTANYL CITRATE (PF) 100 MCG/2ML IJ SOLN
INTRAMUSCULAR | Status: AC
  Filled 2023-08-22: qty 2

## 2023-08-22 NOTE — Patient Instructions (Addendum)
 Thank you for allowing us  to care for you today. You underwent a MILD procedure at the L4-L5 level to help relieve lumbar spinal stenosis and improve function. Below are important post-procedure care instructions to ensure a smooth recovery:  1. Incision Care Keep the incision site dry for the next 36 to 48 hours.  After 36-48 hours, you may shower. Avoid baths, hot tubs, or swimming pools for at least 7 days.  If your Tegaderm (clear bandage) or dressing falls off, simply replace it with a clean Band-Aid.  Monitor the site for signs of infection: increased redness, warmth, swelling, drainage, or fever. Please notify us  if you observe any of these symptoms.  2. Activity Limit strenuous activity for the first few days. Light walking is encouraged to help with circulation and healing.  Avoid heavy lifting, bending, or twisting for at least 3-5 days post-procedure.  Gradually resume normal activities as tolerated, based on how you feel.  3. Pain Management You may experience some soreness or bruising at the incision site for a few days--this is normal.  Use ice packs intermittently (20 minutes on, 20 minutes off) over the area if needed for localized discomfort.  Take pain medications as directed by your provider.  4. Follow-Up Please attend your scheduled follow-up appointment to assess your recovery and progress.  Let us  know if you experience new or worsening leg weakness, loss of bladder or bowel control, or severe back pain--these are not expected and should be evaluated immediately.  If you have any questions or concerns, please do not hesitate to contact our office.  Wishing you a smooth and steady recovery. - Dr. Marcelino & Team       Moderate Conscious Sedation, Adult, Care After After the procedure, it is common to have: Sleepiness for a few hours. Impaired judgment for a few hours. Trouble with balance. Nausea or vomiting if you eat too soon. Follow these  instructions at home: For the time period you were told by your health care provider:  Rest. Do not participate in activities where you could fall or become injured. Do not drive or use machinery. Do not drink alcohol. Do not take sleeping pills or medicines that cause drowsiness. Do not make important decisions or sign legal documents. Do not take care of children on your own. Eating and drinking Follow instructions from your health care provider about what you may eat and drink. Drink enough fluid to keep your urine pale yellow. If you vomit: Drink clear fluids slowly and in small amounts as you are able. Clear fluids include water, ice chips, low-calorie sports drinks, and fruit juice that has water added to it (diluted fruit juice). Eat light and bland foods in small amounts as you are able. These foods include bananas, applesauce, rice, lean meats, toast, and crackers. General instructions Take over-the-counter and prescription medicines only as told by your health care provider. Have a responsible adult stay with you for the time you are told. Do not use any products that contain nicotine or tobacco. These products include cigarettes, chewing tobacco, and vaping devices, such as e-cigarettes. If you need help quitting, ask your health care provider. Return to your normal activities as told by your health care provider. Ask your health care provider what activities are safe for you. Your health care provider may give you more instructions. Make sure you know what you can and cannot do. Contact a health care provider if: You are still sleepy or having trouble with balance after  24 hours. You feel light-headed. You vomit every time you eat or drink. You get a rash. You have a fever. You have redness or swelling around the IV site. Get help right away if: You have trouble breathing. You start to feel confused at home. These symptoms may be an emergency. Get help right away. Call  911. Do not wait to see if the symptoms will go away. Do not drive yourself to the hospital. This information is not intended to replace advice given to you by your health care provider. Make sure you discuss any questions you have with your health care provider. Document Revised: 07/27/2021 Document Reviewed: 07/27/2021 Elsevier Patient Education  2024 [employer] ______________________________________________________________________    Post-Procedure Discharge Instructions  Instructions: Apply ice:  Purpose: This will minimize any swelling and discomfort after procedure.  When: Day of procedure, as soon as you get home. How: Fill a plastic sandwich bag with crushed ice. Cover it with a small towel and apply to injection site. How long: (15 min on, 15 min off) Apply for 15 minutes then remove x 15 minutes.  Repeat sequence on day of procedure, until you go to bed. Apply heat:  Purpose: To treat any soreness and discomfort from the procedure. When: Starting the next day after the procedure. How: Apply heat to procedure site starting the day following the procedure. How long: May continue to repeat daily, until discomfort goes away. Food intake: Start with clear liquids (like water) and advance to regular food, as tolerated.  Physical activities: Keep activities to a minimum for the first 8 hours after the procedure. After that, then as tolerated. Driving: If you have received any sedation, be responsible and do not drive. You are not allowed to drive for 24 hours after having sedation. Blood thinner: (Applies only to those taking blood thinners) You may restart your blood thinner 6 hours after your procedure. Insulin: (Applies only to Diabetic patients taking insulin) As soon as you can eat, you may resume your normal dosing schedule. Infection prevention: Keep procedure site clean and dry. Shower daily and clean area with soap and water. Post-procedure Pain Diary: Extremely important  that this be done correctly and accurately. Recorded information will be used to determine the next step in treatment. For the purpose of accuracy, follow these rules: Evaluate only the area treated. Do not report or include pain from an untreated area. For the purpose of this evaluation, ignore all other areas of pain, except for the treated area. After your procedure, avoid taking a long nap and attempting to complete the pain diary after you wake up. Instead, set your alarm clock to go off every hour, on the hour, for the initial 8 hours after the procedure. Document the duration of the numbing medicine, and the relief you are getting from it. Do not go to sleep and attempt to complete it later. It will not be accurate. If you received sedation, it is likely that you were given a medication that may cause amnesia. Because of this, completing the diary at a later time may cause the information to be inaccurate. This information is needed to plan your care. Follow-up appointment: Keep your post-procedure follow-up evaluation appointment after the procedure (usually 2 weeks for most procedures, 6 weeks for radiofrequencies). DO NOT FORGET to bring you pain diary with you.   Expect: (What should I expect to see with my procedure?) From numbing medicine (AKA: Local Anesthetics): Numbness or decrease in pain. You may also experience  some weakness, which if present, could last for the duration of the local anesthetic. Onset: Full effect within 15 minutes of injected. Duration: It will depend on the type of local anesthetic used. On the average, 1 to 8 hours.  From steroids (Applies only if steroids were used): Decrease in swelling or inflammation. Once inflammation is improved, relief of the pain will follow. Onset of benefits: Depends on the amount of swelling present. The more swelling, the longer it will take for the benefits to be seen. In some cases, up to 10 days. Duration: Steroids will stay in the  system x 2 weeks. Duration of benefits will depend on multiple posibilities including persistent irritating factors. Side-effects: If present, they may typically last 2 weeks (the duration of the steroids). Frequent: Cramps (if they occur, drink Gatorade and take over-the-counter Magnesium 450-500 mg once to twice a day); water retention with temporary weight gain; increases in blood sugar; decreased immune system response; increased appetite. Occasional: Facial flushing (red, warm cheeks); mood swings; menstrual changes. Uncommon: Long-term decrease or suppression of natural hormones; bone thinning. (These are more common with higher doses or more frequent use. This is why we prefer that our patients avoid having any injection therapies in other practices.)  Very Rare: Severe mood changes; psychosis; aseptic necrosis. From procedure: Some discomfort is to be expected once the numbing medicine wears off. This should be minimal if ice and heat are applied as instructed.  Call if: (When should I call?) You experience numbness and weakness that gets worse with time, as opposed to wearing off. New onset bowel or bladder incontinence. (Applies only to procedures done in the spine)  Emergency Numbers: Durning business hours (Monday - Thursday, 8:00 AM - 4:00 PM) (Friday, 9:00 AM - 12:00 Noon): [phone] After hours: [phone] NOTE: If you are having a problem and are unable connect with, or to talk to a provider, then go to your nearest urgent care or emergency department. If the problem is serious and urgent, please call 911. _____________________________________________________________________

## 2023-08-22 NOTE — Progress Notes (Signed)
 Safety precautions to be maintained throughout the outpatient stay will include: orient to surroundings, keep bed in low position, maintain call bell within reach at all times, provide assistance with transfer out of bed and ambulation.

## 2023-08-22 NOTE — Progress Notes (Signed)
 PROVIDER NOTE: Interpretation of information contained herein should be left to medically-trained personnel. Specific patient instructions are provided elsewhere under Patient Instructions section of medical record. This document was created in part using STT-dictation technology, any transcriptional errors that may result from this process are unintentional.  Patient: Todd Bennett Type: Established DOB: November 26, 1932 MRN: [id number] PCP: Glover Lenis, MD  Service: Procedure DOS: 08/22/2023 Setting: Ambulatory Location: Ambulatory outpatient facility Delivery: Face-to-face Provider: Wallie Sherry, MD Specialty: Interventional Pain Management Specialty designation: 09 Location: Outpatient facility Ref. Prov.: Sherry Wallie, MD       Interventional Therapy   Primary Reason for Admission: Surgical management of chronic pain condition.   Procedure:              Type: MILD-minimally invasive lumbar decompression Laterality: Bilateral (-50)  Level: Lumbar L4-L5 Imaging: Fluoroscopic guidance Anesthesia: Local anesthesia (1-2% Lidocaine ) Sedation: Moderate Sedation                       DOS: 08/22/2023  Performed by: Wallie Sherry, MD  Purpose: Diagnostic. To determine if a permanent implant may be effective in controlling some or all of Todd Bennett chronic pain symptoms.  Rationale (medical necessity): procedure needed and proper for the diagnosis and/or treatment of Todd Bennett medical symptoms and needs. 1. Spinal stenosis, lumbar region, with neurogenic claudication   2. Chronic pain syndrome    NAS-11 Pain score:   Pre-procedure: 0-No pain (8 - 9 while walking)/10   Post-procedure: 0-No pain/10      Region: Lumbar  Type of procedure: Surgical   Position / Prep / Materials:  Position: Prone  Prep solution: ChloraPrep (2% chlorhexidine gluconate and 70% isopropyl alcohol) Prep Area: Entire  Posterior  Lumbar  Region  Materials:  Tray: MILD kit H&P (Pre-op Assessment):  Mr.  Bennett is a 89 y.o. (year old), male patient, seen today for interventional treatment. He  has a past surgical history that includes Ileostomy; Lithotripsy (03/2013); Hernia repair (1993); LEFT HEART CATH AND CORONARY ANGIOGRAPHY (N/A, 03/15/2017); CORONARY STENT INTERVENTION (N/A, 03/15/2017); IR Angiogram Extremity Left (02/10/2018); IR Angiogram Selective Each Additional Vessel (02/10/2018); IR Angiogram Selective Each Additional Vessel (02/10/2018); IR US  Guide Vasc Access Right (02/10/2018); IR EMBO VENOUS NOT HEMORR HEMANG  INC GUIDE ROADMAPPING (02/10/2018); and IR Angiogram Selective Each Additional Vessel (02/10/2018).  Initial Vital Signs:  Pulse/EKG Rate: (!) 56ECG Heart Rate: 65 (nsr) Temp: (!) 97.3 F (36.3 C) Resp: 16 BP: (!) 140/64 SpO2: 100 %  BMI: Estimated body mass index is 22.32 kg/m as calculated from the following:   Height as of this encounter: 5' 11 (1.803 m).   Weight as of this encounter: 160 lb (72.6 kg).  Risk Assessment: Allergies: Reviewed. He is allergic to ciprofloxacin, clarithromycin, elemental sulfur, oxycodone, and penicillins.  Allergy Precautions: None required Coagulopathies: Reviewed. None identified.  Blood-thinner therapy: None at this time Active Infection(s): Reviewed. None identified. Todd Bennett is afebrile  Site Confirmation: Todd Bennett was asked to confirm the procedure and laterality before marking the site, which he did. Procedure checklist: Completed Consent: Before the procedure and under the influence of no sedative(s), amnesic(s), or anxiolytics, the patient was informed of the treatment options, risks and possible complications. To fulfill our ethical and legal obligations, as recommended by the American Medical Association's Code of Ethics, I have informed the patient of my clinical impression; the nature and purpose of the treatment or procedure; the risks, benefits, and possible complications of the intervention; the alternatives,  including doing  nothing; the risk(s) and benefit(s) of the alternative treatment(s) or procedure(s); and the risk(s) and benefit(s) of doing nothing.  Todd Bennett was provided with information about the general risks and possible complications associated with most interventional procedures. These include, but are not limited to: failure to achieve desired goals, infection, bleeding, organ or nerve damage, allergic reactions, paralysis, and/or death.  In addition, he was informed of those risks and possible complications associated to this particular procedure, which include, but are not limited to: damage to the implant; failure to decrease pain; local, systemic, or serious CNS infections, intraspinal abscess with possible cord compression and paralysis, or life-threatening such as meningitis; intrathecal and/or epidural bleeding with formation of hematoma with possible spinal cord compression and permanent paralysis; organ damage; nerve injury or damage with subsequent sensory, motor, and/or autonomic system dysfunction, resulting in transient or permanent pain, numbness, and/or weakness of one or several areas of the body; allergic reactions, either minor or major life-threatening, such as anaphylactic or anaphylactoid reactions.  Furthermore, Todd Bennett was informed of those risks and complications associated with the medications. These include, but are not limited to: allergic reactions (i.e.: anaphylactic or anaphylactoid reactions); arrhythmia;  Hypotension/hypertension; cardiovascular collapse; respiratory depression and/or shortness of breath; swelling or edema; medication-induced neural toxicity; particulate matter embolism and blood vessel occlusion with resultant organ, and/or nervous system infarction and permanent paralysis.  Finally, he was informed that Medicine is not an exact science; therefore, there is also the possibility of unforeseen or unpredictable risks and/or possible complications that may result in a  catastrophic outcome. The patient indicated having understood very clearly. We have given the patient no guarantees and we have made no promises. Enough time was given to the patient to ask questions, all of which were answered to the patient's satisfaction. Mr. Town has indicated that he wanted to continue with the procedure. Attestation: I, the ordering provider, attest that I have discussed with the patient the benefits, risks, side-effects, alternatives, likelihood of achieving goals, and potential problems during recovery for the procedure that I have provided informed consent. Date  Time: 08/22/2023  8:17 AM  Pre-Procedure Preparation:  Monitoring: As per clinic protocol. Respiration, ETCO2, SpO2, BP, heart rate and rhythm monitor placed and checked for adequate function Safety Precautions: Patient was assessed for positional comfort and pressure points before starting the procedure. Time-out: I initiated and conducted the Time-out before starting the procedure, as per protocol. The patient was asked to participate by confirming the accuracy of the Time Out information. Verification of the correct person, site, and procedure were performed and confirmed by me, the nursing staff, and the patient. Time-out conducted as per Joint Commission's Universal Protocol (UP.01.01.01). Time: 0903 Start Time: 0903 hrs.  Description/Narrative of Procedure:           Procedure Description: The patient was brought to the procedure suite in stable condition, positioned prone on the fluoroscopy table, and all pressure points were padded. Moderate sedation was administered, and time-out was performed to confirm correct patient, procedure, and site.  The lumbar spine was prepped and draped in sterile fashion. Fluoroscopy was used to identify the L4-L5 interspace. Local anesthesia was achieved using 1% lidocaine  at the planned entry sites bilaterally.  Under continuous fluoroscopic guidance, bilateral access  was obtained using the MILD portal cannula technique at the L4-L5 level. The MILD procedure was performed using the standard tools including the portal access cannula, bone sculptor, and tissue sculpter. Hypertrophic ligamentum flavum was removed bilaterally  using the MILD technique until satisfactory decompression was visualized and achieved fluoroscopically. There were no intraoperative complications.  The instruments were withdrawn, and the skin was cleaned and dressed with sterile Tegaderm. Hemostasis was achieved. The patient tolerated the procedure well and was transferred to recovery in stable condition.  Estimated Blood Loss: Minimal Complications: None    Vitals:   08/22/23 0951 08/22/23 1005 08/22/23 1015 08/22/23 1025  BP: 139/80 (!) 172/69 (!) 160/90 106/78  Pulse:      Resp: 15 15 19 15   Temp:      TempSrc:      SpO2: 96% 96% 94% 96%  Weight:      Height:        Start Time: 0903 hrs. End Time: 0951 hrs.   Imaging Guidance (Spinal):         Type of Imaging Technique: Fluoroscopy Guidance (Spinal) Indication(s): Fluoroscopy guidance for needle placement to enhance accuracy in procedures requiring precise needle localization for targeted delivery of medication in or near specific anatomical locations not easily accessible without such real-time imaging assistance. Exposure Time: Please see nurses notes. Contrast: None used. Fluoroscopic Guidance: I was personally present during the use of fluoroscopy. Tunnel Vision Technique used to obtain the best possible view of the target area. Parallax error corrected before commencing the procedure. Direction-depth-direction technique used to introduce the needle under continuous pulsed fluoroscopy. Once target was reached, antero-posterior, oblique, and lateral fluoroscopic projection used confirm needle placement in all planes. Images permanently stored in EMR. Interpretation: No contrast injected. I personally interpreted the  imaging intraoperatively. Adequate needle placement confirmed in multiple planes. Permanent images saved into the patient's record.  Antibiotic Prophylaxis:   Anti-infectives (From admission, onward)    Start     Dose/Rate Route Frequency Ordered Stop   08/22/23 0830  clindamycin  (CLEOCIN ) IVPB 900 mg       Note to Pharmacy: Pediatric Recommended Dose: 10 mg/kg   900 mg 100 mL/hr over 30 Minutes Intravenous  Once 08/22/23 0819 08/22/23 0905      Indication(s): Surgical Prophylaxis.  Post-operative Assessment:  Post-procedure Vital Signs:  Pulse/HCG Rate: (!) 5666 Temp: (!) 97.3 F (36.3 C) Resp: 15 BP: 106/78 SpO2: 96 %  Complications: No immediate post-treatment complications observed by team, or reported by patient.  Note: The patient tolerated the entire procedure well. A repeat set of vitals were taken after the procedure and the patient was kept under observation following institutional policy, for this type of procedure. Post-procedural neurological assessment was performed, showing return to baseline, prior to discharge. The patient was provided with post-procedure discharge instructions, including a section on how to identify potential problems. Should any problems arise concerning this procedure, the patient was given instructions to immediately contact us , at any time, without hesitation. In any case, we plan to contact the patient by telephone for a follow-up status report regarding this interventional procedure.  Comments:  No additional relevant information.  Plan of Care Orders:  Plan:  Follow up in clinic in 7-14 days  Monitor for signs of infection or neurologic deficit  Continue with activity as tolerated  Reinforce physical therapy and core strengthening post-recovery  Postop instructions given regarding wound care and bathing  Orders Placed This Encounter  Procedures   DG PAIN CLINIC C-ARM 1-60 MIN NO REPORT    Intraoperative interpretation by  procedural physician at [facility].    Standing Status:   Standing    Number of Occurrences:   1  Reason for exam::   Assistance in needle guidance and placement for procedures requiring needle placement in or near specific anatomical locations not easily accessible without such assistance.    Medications administered: We administered lactated ringers , midazolam , fentaNYL , and clindamycin .  See the medical record for exact dosing, route, and time of administration.  Follow-up plan:   Return in about 2 weeks (around 09/05/2023) for PPE in person.     Recent Visits Date Type Provider Dept  06/16/23 Office Visit Marcelino Nurse, MD [facility]  Showing recent visits within past 90 days and meeting all other requirements Today's Visits Date Type Provider Dept  08/22/23 Procedure visit Marcelino Nurse, MD [facility]  Showing today's visits and meeting all other requirements Future Appointments Date Type Provider Dept  09/06/23 Appointment Marcelino Nurse, MD [facility]  Showing future appointments within next 90 days and meeting all other requirements  Disposition: Discharge home  Discharge (Date  Time): 08/22/2023; 1030 hrs.   Primary Care Physician: Glover Lenis, MD Location: [facility] Note by: Nurse Marcelino, MD (TTS technology used. I apologize for any typographical errors that were not detected and corrected.) Date: 08/22/2023; Time: 11:55 AM

## 2023-08-23 ENCOUNTER — Telehealth: Payer: Self-pay

## 2023-08-23 NOTE — Telephone Encounter (Signed)
 Posr procedure follow up.  Patient states he is doing good.

## 2023-09-06 ENCOUNTER — Ambulatory Visit
Attending: Student in an Organized Health Care Education/Training Program | Admitting: Student in an Organized Health Care Education/Training Program

## 2023-09-06 ENCOUNTER — Encounter: Payer: Self-pay | Admitting: Student in an Organized Health Care Education/Training Program

## 2023-09-06 VITALS — BP 160/66 | HR 57 | Temp 97.8°F | Resp 14 | Ht 71.0 in | Wt 160.0 lb

## 2023-09-06 DIAGNOSIS — G894 Chronic pain syndrome: Secondary | ICD-10-CM | POA: Insufficient documentation

## 2023-09-06 DIAGNOSIS — G8929 Other chronic pain: Secondary | ICD-10-CM | POA: Diagnosis not present

## 2023-09-06 DIAGNOSIS — M48062 Spinal stenosis, lumbar region with neurogenic claudication: Secondary | ICD-10-CM | POA: Insufficient documentation

## 2023-09-06 DIAGNOSIS — M4726 Other spondylosis with radiculopathy, lumbar region: Secondary | ICD-10-CM

## 2023-09-06 DIAGNOSIS — M47816 Spondylosis without myelopathy or radiculopathy, lumbar region: Secondary | ICD-10-CM | POA: Insufficient documentation

## 2023-09-06 DIAGNOSIS — M5416 Radiculopathy, lumbar region: Secondary | ICD-10-CM | POA: Diagnosis not present

## 2023-09-06 NOTE — Patient Instructions (Signed)
 Your mild Procedure Summary  You recently had the mild (minimally invasive lumbar decompression) procedure at L4-L5 to relieve pressure in your spinal canal caused by thickened ligament tissue.  This procedure:  Removed small amounts of excess ligament through a tiny incision (about the size of a baby aspirin )  Created more space for the nerves in your lower back  Improved blood flow and nerve function, helping reduce your leg and back symptoms  As a result, you're now able to walk farther and stand longer without as much discomfort. This means the pressure on your spinal nerves has been successfully relieved, allowing you to return to more of your normal daily activities.  We'll continue to monitor your progress, but these improvements are a strong sign the procedure achieved its goal.

## 2023-09-06 NOTE — Progress Notes (Signed)
 PROVIDER NOTE: Interpretation of information contained herein should be left to medically-trained personnel. Specific patient instructions are provided elsewhere under Patient Instructions section of medical record. This document was created in part using AI and STT-dictation technology, any transcriptional errors that may result from this process are unintentional.  Patient: Todd Bennett  Service: E/M   PCP: Glover Lenis, MD  DOB: 11/26/1932  DOS: 09/06/2023  Provider: Wallie Sherry, MD  MRN: [id number]  Delivery: Face-to-face  Specialty: Interventional Pain Management  Type: Established Patient  Setting: Ambulatory outpatient facility  Specialty designation: 09  Referring Prov.: Glover Lenis, MD  Location: Outpatient office facility       History of present illness (HPI) Todd Bennett, a 89 y.o. year old male, is here today because of his Spinal stenosis, lumbar region, with neurogenic claudication [M48.062]. Todd Bennett primary complain today is Back Pain (lower)  Pertinent problems: Todd Bennett does not have any pertinent problems on file.  Pain Assessment: Severity of Chronic pain is reported as a 0-No pain/10. Location: Back Lower/ . Onset: More than a month ago. Quality: Discomfort. Timing:  . Modifying factor(s): rest. Vitals:  height is 5' 11 (1.803 m) and weight is 160 lb (72.6 kg). His temporal temperature is 97.8 F (36.6 C). His blood pressure is 160/66 (abnormal) and his pulse is 57 (abnormal). His respiration is 14 and oxygen saturation is 95%.  BMI: Estimated body mass index is 22.32 kg/m as calculated from the following:   Height as of this encounter: 5' 11 (1.803 m).   Weight as of this encounter: 160 lb (72.6 kg).  Last encounter: 06/16/2023. Last procedure: 08/22/2023.  Reason for encounter: post-procedure evaluation and assessment after MILD 08/22/23.  Discussed the use of AI scribe software for clinical note transcription with the patient, who gave verbal consent to  proceed.  History of Present Illness   Todd Bennett is an 89 year old male who presents for follow-up after minimally invasive lumbar decompression at L4, L5.  He underwent a minimally invasive lumbar decompression at L4, L5 and has experienced improvement in walking distance and standing time since the procedure. He had two falls on Sunday, resulting in soreness, but overall feels okay. No significant back pain is present, and he does not feel the need for an ablation at this time.  He previously underwent an ablation in October 2024, with effects typically lasting about a year. He has a history of participating in physical therapy for about nine months last year, which he found aggravating, and prefers not to pursue it again. He is considering using a floor pedal exerciser to maintain physical activity while seated.  He has not consulted a neurologist regarding balance issues, which may be related to age-related changes rather than his spine. No significant back pain is reported, and he does not feel the need for an ablation at this time. He reports soreness after two falls on Sunday but feels okay overall.     Post-Procedure Evaluation   Type: MILD-minimally invasive lumbar decompression Laterality: Bilateral (-50)  Level: Lumbar L4-L5 Imaging: Fluoroscopic guidance Anesthesia: Local anesthesia (1-2% Lidocaine ) Sedation: Moderate Sedation                       DOS: 08/22/2023  Performed by: Wallie Sherry, MD  Purpose: Diagnostic. To determine if a permanent implant may be effective in controlling some or all of Todd Bennett chronic pain symptoms.  Rationale (medical necessity): procedure needed and proper  for the diagnosis and/or treatment of Todd Bennett medical symptoms and needs. 1. Spinal stenosis, lumbar region, with neurogenic claudication   2. Chronic pain syndrome    NAS-11 Pain score:   Pre-procedure: 0-No pain (8 - 9 while walking)/10   Post-procedure: 0-No pain/10      Effectiveness:  Initial hour after procedure: 100 %  Subsequent 4-6 hours post-procedure: 100 %  Analgesia past initial 6 hours: 100 %  Ongoing improvement:  Analgesic:  100%       ROS  Constitutional: Denies any fever or chills Gastrointestinal: No reported hemesis, hematochezia, vomiting, or acute GI distress Musculoskeletal: Acute left hip pain due to fall Neurological: No reported episodes of acute onset apraxia, aphasia, dysarthria, agnosia, amnesia, paralysis, loss of coordination, or loss of consciousness  Medication Review  Cholecalciferol , FLUoxetine , Fish Oil, Multi-Vitamins, acetaminophen , aspirin  EC, atorvastatin , celecoxib , finasteride , losartan , metoprolol  tartrate, metroNIDAZOLE , mirtazapine , nitroGLYCERIN , omeprazole, tamsulosin , venlafaxine  XR, and vitamin C   History Review  Allergy: Todd Bennett is allergic to ciprofloxacin, clarithromycin, elemental sulfur, oxycodone, and penicillins. Drug: Todd Bennett  reports no history of drug use. Alcohol:  reports no history of alcohol use. Tobacco:  reports that he has quit smoking. He has never used smokeless tobacco. Social: Todd Bennett  reports that he has quit smoking. He has never used smokeless tobacco. He reports that he does not drink alcohol and does not use drugs. Medical:  has a past medical history of Actinic keratosis, Allergy, Anxiety (06/17/2014), Basal cell carcinoma (06/24/2009), Basal cell carcinoma (09/11/2012), Basal cell carcinoma (02/19/2021), Benign prostatic hyperplasia with urinary obstruction (06/17/2014), Benign prostatic hypertrophy without urinary obstruction (06/20/2013), Bladder calculi (04/26/2013), Calculi, ureter (03/29/2013), Calculus of kidney (02/25/2013), Cataract, Clinical depression (06/17/2014), Degenerative disc disease, lumbar, Frank hematuria (06/28/2013), HLD (hyperlipidemia) (06/17/2014), squamous cell carcinoma, Spondylolysis, Squamous cell carcinoma of skin (09/11/2012), Squamous cell  carcinoma of skin (03/10/2015), and Squamous cell carcinoma of skin (07/26/2016). Surgical: Todd Bennett  has a past surgical history that includes Ileostomy; Lithotripsy (03/2013); Hernia repair (1993); LEFT HEART CATH AND CORONARY ANGIOGRAPHY (N/A, 03/15/2017); CORONARY STENT INTERVENTION (N/A, 03/15/2017); IR Angiogram Extremity Left (02/10/2018); IR Angiogram Selective Each Additional Vessel (02/10/2018); IR Angiogram Selective Each Additional Vessel (02/10/2018); IR US  Guide Vasc Access Right (02/10/2018); IR EMBO VENOUS NOT HEMORR HEMANG  INC GUIDE ROADMAPPING (02/10/2018); and IR Angiogram Selective Each Additional Vessel (02/10/2018). Family: family history includes Diabetes in his mother.  Laboratory Chemistry Profile   Renal Lab Results  Component Value Date   BUN 20 09/03/2021   CREATININE 0.90 09/03/2021   BCR 17 10/13/2017   GFRAA >60 02/12/2018   GFRNONAA >60 09/03/2021    Hepatic Lab Results  Component Value Date   AST 25 03/14/2017   ALT 23 03/14/2017   ALBUMIN 3.9 03/14/2017   ALKPHOS 52 03/14/2017   LIPASE 39 03/14/2017    Electrolytes Lab Results  Component Value Date   NA 139 09/03/2021   K 4.0 09/03/2021   CL 109 09/03/2021   CALCIUM  9.1 09/03/2021    Bone No results found for: VD25OH, VD125OH2TOT, CI6874NY7, CI7874NY7, 25OHVITD1, 25OHVITD2, 25OHVITD3, TESTOFREE, TESTOSTERONE  Inflammation (CRP: Acute Phase) (ESR: Chronic Phase) No results found for: CRP, ESRSEDRATE, LATICACIDVEN       Note: Above Lab results reviewed.  Recent Imaging Review  DG PAIN CLINIC C-ARM 1-60 MIN NO REPORT Fluoro was used, but no Radiologist interpretation will be provided.  Please refer to NOTES tab for provider progress note. Note: Reviewed        Physical Exam  Vitals: BP (!) 160/66 (Cuff Size: Normal)   Pulse (!) 57   Temp 97.8 F (36.6 C) (Temporal)   Resp 14   Ht 5' 11 (1.803 m)   Wt 160 lb (72.6 kg)   SpO2 95%   BMI 22.32 kg/m  BMI: Estimated  body mass index is 22.32 kg/m as calculated from the following:   Height as of this encounter: 5' 11 (1.803 m).   Weight as of this encounter: 160 lb (72.6 kg). Ideal: Ideal body weight: 75.3 kg (166 lb 0.1 oz) General appearance: Well nourished, well developed, and well hydrated. In no apparent acute distress Mental status: Alert, oriented x 3 (person, place, & time)       Respiratory: No evidence of acute respiratory distress Eyes: PERLA  Incision healing up well, clean, dry, not erythematous, nontender to touch  Improvement in walking distance and standing time  Assessment   Diagnosis Status  1. Spinal stenosis, lumbar region, with neurogenic claudication   2. Lumbar spondylosis   3. Lumbar facet arthropathy   4. Chronic radicular lumbar pain   5. Chronic pain syndrome    Improving Controlled Controlled   Updated Problems: No problems updated.  Plan of Care  Assessment and Plan    Lumbar spinal stenosis, status post MILD decompression   Status post MILD decompression at L4, L5 has resulted in improved walking distance and standing time. The procedure focused on improving patient's standing and walking time.   Lumbar spondylosis, lumbar facet arthropathy: Patient is status post bilateral L3, L4, L5 RFA on 11/17/2023.  Continue to monitor low back pain. There may be a need for repeat ablation if back pain increases due to nerve regeneration approximately one year post-ablation. Schedule a follow-up appointment in November to assess the back condition and consider repeating lumbar radiofrequency ablation bilaterally at L3, L4, L5 if necessary.  Gait and balance disturbance   Gait and balance disturbance is likely related to age-related proprioceptive decline rather than spinal issues. A neurological evaluation may be needed for further assessment. Discussed using a floor pedal exerciser to improve blood flow and muscle activation, which may aid in balance improvement. Consider  referral to neurology for evaluation of balance issues and advise use of a floor pedal exerciser.  Falls   He has experienced recent falls with soreness. Advise continued use of the assistive device for stability.         Return in about 12 weeks (around 11/29/2023) for follow up to review MILD and L-RFA.    Recent Visits Date Type Provider Dept  08/22/23 Procedure visit Marcelino Nurse, MD [facility]  06/16/23 Office Visit Marcelino Nurse, MD [facility]  Showing recent visits within past 90 days and meeting all other requirements Today's Visits Date Type Provider Dept  09/06/23 Office Visit Marcelino Nurse, MD [facility]  Showing today's visits and meeting all other requirements Future Appointments Date Type Provider Dept  11/29/23 Appointment Marcelino Nurse, MD [facility]  Showing future appointments within next 90 days and meeting all other requirements  I discussed the assessment and treatment plan with the patient. The patient was provided an opportunity to ask questions and all were answered. The patient agreed with the plan and demonstrated an understanding of the instructions.  Patient advised to call back or seek an in-person evaluation if the symptoms or condition worsens.  Duration of encounter: .  Total time on encounter, as per AMA guidelines included both the face-to-face and non-face-to-face  time personally spent by the physician and/or other qualified health care professional(s) on the day of the encounter (includes time in activities that require the physician or other qualified health care professional and does not include time in activities normally performed by clinical staff). Physician's time may include the following activities when performed: Preparing to see the patient (e.g., pre-charting review of records, searching for previously ordered imaging, lab work, and nerve conduction tests) Review of prior analgesic  pharmacotherapies. Reviewing PMP Interpreting ordered tests (e.g., lab work, imaging, nerve conduction tests) Performing post-procedure evaluations, including interpretation of diagnostic procedures Obtaining and/or reviewing separately obtained history Performing a medically appropriate examination and/or evaluation Counseling and educating the patient/family/caregiver Ordering medications, tests, or procedures Referring and communicating with other health care professionals (when not separately reported) Documenting clinical information in the electronic or other health record Independently interpreting results (not separately reported) and communicating results to the patient/ family/caregiver Care coordination (not separately reported)  Note by: Wallie Sherry, MD (TTS and AI technology used. I apologize for any typographical errors that were not detected and corrected.) Date: 09/06/2023; Time: 9:56 AM

## 2023-09-16 DIAGNOSIS — Z933 Colostomy status: Secondary | ICD-10-CM | POA: Diagnosis not present

## 2023-09-28 DIAGNOSIS — D2271 Melanocytic nevi of right lower limb, including hip: Secondary | ICD-10-CM | POA: Diagnosis not present

## 2023-09-28 DIAGNOSIS — C44612 Basal cell carcinoma of skin of right upper limb, including shoulder: Secondary | ICD-10-CM | POA: Diagnosis not present

## 2023-09-28 DIAGNOSIS — D485 Neoplasm of uncertain behavior of skin: Secondary | ICD-10-CM | POA: Diagnosis not present

## 2023-09-28 DIAGNOSIS — Z85828 Personal history of other malignant neoplasm of skin: Secondary | ICD-10-CM | POA: Diagnosis not present

## 2023-09-28 DIAGNOSIS — D0462 Carcinoma in situ of skin of left upper limb, including shoulder: Secondary | ICD-10-CM | POA: Diagnosis not present

## 2023-09-28 DIAGNOSIS — D225 Melanocytic nevi of trunk: Secondary | ICD-10-CM | POA: Diagnosis not present

## 2023-09-28 DIAGNOSIS — L821 Other seborrheic keratosis: Secondary | ICD-10-CM | POA: Diagnosis not present

## 2023-09-28 DIAGNOSIS — L570 Actinic keratosis: Secondary | ICD-10-CM | POA: Diagnosis not present

## 2023-09-28 DIAGNOSIS — Z08 Encounter for follow-up examination after completed treatment for malignant neoplasm: Secondary | ICD-10-CM | POA: Diagnosis not present

## 2023-09-28 DIAGNOSIS — D2272 Melanocytic nevi of left lower limb, including hip: Secondary | ICD-10-CM | POA: Diagnosis not present

## 2023-09-28 DIAGNOSIS — D2261 Melanocytic nevi of right upper limb, including shoulder: Secondary | ICD-10-CM | POA: Diagnosis not present

## 2023-09-28 DIAGNOSIS — C44319 Basal cell carcinoma of skin of other parts of face: Secondary | ICD-10-CM | POA: Diagnosis not present

## 2023-09-28 DIAGNOSIS — D2262 Melanocytic nevi of left upper limb, including shoulder: Secondary | ICD-10-CM | POA: Diagnosis not present

## 2023-09-28 DIAGNOSIS — B372 Candidiasis of skin and nail: Secondary | ICD-10-CM | POA: Diagnosis not present

## 2023-10-03 DIAGNOSIS — M5442 Lumbago with sciatica, left side: Secondary | ICD-10-CM | POA: Diagnosis not present

## 2023-10-03 DIAGNOSIS — E785 Hyperlipidemia, unspecified: Secondary | ICD-10-CM | POA: Diagnosis not present

## 2023-10-03 DIAGNOSIS — M5441 Lumbago with sciatica, right side: Secondary | ICD-10-CM | POA: Diagnosis not present

## 2023-10-03 DIAGNOSIS — F32A Depression, unspecified: Secondary | ICD-10-CM | POA: Diagnosis not present

## 2023-10-03 DIAGNOSIS — K219 Gastro-esophageal reflux disease without esophagitis: Secondary | ICD-10-CM | POA: Diagnosis not present

## 2023-10-03 DIAGNOSIS — I2510 Atherosclerotic heart disease of native coronary artery without angina pectoris: Secondary | ICD-10-CM | POA: Diagnosis not present

## 2023-10-03 DIAGNOSIS — Z0000 Encounter for general adult medical examination without abnormal findings: Secondary | ICD-10-CM | POA: Diagnosis not present

## 2023-10-03 DIAGNOSIS — Z1331 Encounter for screening for depression: Secondary | ICD-10-CM | POA: Diagnosis not present

## 2023-10-03 DIAGNOSIS — N401 Enlarged prostate with lower urinary tract symptoms: Secondary | ICD-10-CM | POA: Diagnosis not present

## 2023-10-03 DIAGNOSIS — N200 Calculus of kidney: Secondary | ICD-10-CM | POA: Diagnosis not present

## 2023-10-14 DIAGNOSIS — E785 Hyperlipidemia, unspecified: Secondary | ICD-10-CM | POA: Diagnosis not present

## 2023-10-14 DIAGNOSIS — N401 Enlarged prostate with lower urinary tract symptoms: Secondary | ICD-10-CM | POA: Diagnosis not present

## 2023-10-14 DIAGNOSIS — I2510 Atherosclerotic heart disease of native coronary artery without angina pectoris: Secondary | ICD-10-CM | POA: Diagnosis not present

## 2023-10-14 DIAGNOSIS — N200 Calculus of kidney: Secondary | ICD-10-CM | POA: Diagnosis not present

## 2023-10-17 DIAGNOSIS — K219 Gastro-esophageal reflux disease without esophagitis: Secondary | ICD-10-CM | POA: Diagnosis not present

## 2023-10-17 DIAGNOSIS — E782 Mixed hyperlipidemia: Secondary | ICD-10-CM | POA: Diagnosis not present

## 2023-10-17 DIAGNOSIS — Z955 Presence of coronary angioplasty implant and graft: Secondary | ICD-10-CM | POA: Diagnosis not present

## 2023-10-17 DIAGNOSIS — R296 Repeated falls: Secondary | ICD-10-CM | POA: Diagnosis not present

## 2023-10-17 DIAGNOSIS — E785 Hyperlipidemia, unspecified: Secondary | ICD-10-CM | POA: Diagnosis not present

## 2023-10-17 DIAGNOSIS — M5441 Lumbago with sciatica, right side: Secondary | ICD-10-CM | POA: Diagnosis not present

## 2023-10-17 DIAGNOSIS — I214 Non-ST elevation (NSTEMI) myocardial infarction: Secondary | ICD-10-CM | POA: Diagnosis not present

## 2023-10-17 DIAGNOSIS — I2510 Atherosclerotic heart disease of native coronary artery without angina pectoris: Secondary | ICD-10-CM | POA: Diagnosis not present

## 2023-10-17 DIAGNOSIS — I10 Essential (primary) hypertension: Secondary | ICD-10-CM | POA: Diagnosis not present

## 2023-10-17 DIAGNOSIS — M5442 Lumbago with sciatica, left side: Secondary | ICD-10-CM | POA: Diagnosis not present

## 2023-10-21 ENCOUNTER — Ambulatory Visit: Admitting: Podiatry

## 2023-10-28 ENCOUNTER — Ambulatory Visit: Admitting: Podiatry

## 2023-11-04 ENCOUNTER — Encounter: Payer: Self-pay | Admitting: Podiatry

## 2023-11-04 ENCOUNTER — Ambulatory Visit: Admitting: Podiatry

## 2023-11-04 VITALS — Ht 71.0 in | Wt 160.0 lb

## 2023-11-04 DIAGNOSIS — M79675 Pain in left toe(s): Secondary | ICD-10-CM

## 2023-11-04 DIAGNOSIS — M79674 Pain in right toe(s): Secondary | ICD-10-CM

## 2023-11-04 DIAGNOSIS — B351 Tinea unguium: Secondary | ICD-10-CM | POA: Diagnosis not present

## 2023-11-04 NOTE — Progress Notes (Signed)
   Chief Complaint  Patient presents with   Nail Problem    Pt is here for RFC.    SUBJECTIVE Patient presents to office today complaining of elongated, thickened nails that cause pain while ambulating in shoes.  Patient is unable to trim their own nails. Patient is here for further evaluation and treatment.  Past Medical History:  Diagnosis Date   Actinic keratosis    Allergy    Anxiety 06/17/2014   Basal cell carcinoma 06/24/2009   Left nose sup. nasal alar crease.    Basal cell carcinoma 09/11/2012   Left lateral bicep. Focal sclerosis. Excised: 11/01/2012, margins free.   Basal cell carcinoma 02/19/2021   R preauricular - ED&C   Benign prostatic hyperplasia with urinary obstruction 06/17/2014   Benign prostatic hypertrophy without urinary obstruction 06/20/2013   Bladder calculi 04/26/2013   Calculi, ureter 03/29/2013   Calculus of kidney 02/25/2013   Cataract    Clinical depression 06/17/2014   Degenerative disc disease, lumbar    Frank hematuria 06/28/2013   HLD (hyperlipidemia) 06/17/2014   Hx of squamous cell carcinoma    multiple sites   Spondylolysis    Squamous cell carcinoma of skin 09/11/2012   Right lateral antecubital area. SCCis.    Squamous cell carcinoma of skin 03/10/2015   Right temple hair line. SCCis   Squamous cell carcinoma of skin 07/26/2016   Right temple hair line. SCCis, hypertrophic.    Allergies  Allergen Reactions   Ciprofloxacin Shortness Of Breath and Swelling    Tongue thickened   Clarithromycin Shortness Of Breath and Swelling    Tongue thickened   Elemental Sulfur Shortness Of Breath and Swelling    Tongue thicened   Oxycodone Shortness Of Breath and Swelling    Tongue thickened   Penicillins Shortness Of Breath and Swelling    Tongue thickened and knots on bottom of feet     OBJECTIVE General Patient is awake, alert, and oriented x 3 and in no acute distress. Derm Skin is dry and supple bilateral. Negative open lesions or  macerations. Remaining integument unremarkable. Nails are tender, long, thickened and dystrophic with subungual debris, consistent with onychomycosis, 1-5 bilateral. No signs of infection noted. Vasc  DP and PT pedal pulses palpable bilaterally. Temperature gradient within normal limits.  Neuro Epicritic and protective threshold sensation grossly intact bilaterally.  Musculoskeletal Exam No symptomatic pedal deformities noted bilateral. Muscular strength within normal limits.  ASSESSMENT 1.  Pain due to onychomycosis of toenails both  PLAN OF CARE 1. Patient evaluated today.  2. Instructed to maintain good pedal hygiene and foot care.  3. Mechanical debridement of nails 1-5 bilaterally performed using a nail nipper. Filed with dremel without incident.  4. Return to clinic in 3 mos.    Thresa EMERSON Sar, DPM [facility]  Dr. Thresa EMERSON Sar, DPM    [address] [place], [place] [address]                Office [phone]  Fax [phone]

## 2023-11-23 DIAGNOSIS — C44612 Basal cell carcinoma of skin of right upper limb, including shoulder: Secondary | ICD-10-CM | POA: Diagnosis not present

## 2023-11-29 ENCOUNTER — Ambulatory Visit
Attending: Student in an Organized Health Care Education/Training Program | Admitting: Student in an Organized Health Care Education/Training Program

## 2023-11-29 ENCOUNTER — Encounter: Payer: Self-pay | Admitting: Student in an Organized Health Care Education/Training Program

## 2023-11-29 VITALS — BP 136/51 | HR 53 | Temp 98.1°F | Resp 16 | Ht 71.0 in | Wt 160.0 lb

## 2023-11-29 DIAGNOSIS — M25562 Pain in left knee: Secondary | ICD-10-CM | POA: Insufficient documentation

## 2023-11-29 DIAGNOSIS — M5416 Radiculopathy, lumbar region: Secondary | ICD-10-CM | POA: Insufficient documentation

## 2023-11-29 DIAGNOSIS — G8929 Other chronic pain: Secondary | ICD-10-CM | POA: Insufficient documentation

## 2023-11-29 DIAGNOSIS — M48062 Spinal stenosis, lumbar region with neurogenic claudication: Secondary | ICD-10-CM | POA: Insufficient documentation

## 2023-11-29 DIAGNOSIS — M47816 Spondylosis without myelopathy or radiculopathy, lumbar region: Secondary | ICD-10-CM | POA: Diagnosis not present

## 2023-11-29 MED ORDER — GABAPENTIN 100 MG PO CAPS: 100.0000 mg | ORAL_CAPSULE | Freq: Every day | ORAL | 1 refills | Status: AC

## 2023-11-29 NOTE — Progress Notes (Signed)
 PROVIDER NOTE: Interpretation of information contained herein should be left to medically-trained personnel. Specific patient instructions are provided elsewhere under Patient Instructions section of medical record. This document was created in part using AI and STT-dictation technology, any transcriptional errors that may result from this process are unintentional.  Patient: Todd Bennett  Service: E/M   PCP: Glover Lenis, MD  DOB: 1932-10-31  DOS: 11/29/2023  Provider: Wallie Sherry, MD  MRN: [id number]  Delivery: Face-to-face  Specialty: Interventional Pain Management  Type: Established Patient  Setting: Ambulatory outpatient facility  Specialty designation: 09  Referring Prov.: Glover Lenis, MD  Location: Outpatient office facility       History of present illness (HPI) Todd Bennett, a 89 y.o. year old male, is here today because of his Spinal stenosis, lumbar region, with neurogenic claudication [M48.062]. Todd Bennett primary complain today is Back Pain (No c/o back pain since MILD), Leg Pain (bilat), and Shoulder Pain (bilat)  Pertinent problems: Todd Bennett does not have any pertinent problems on file.  Pain Assessment: Severity of   is reported as a 4 /10. Location: Leg (hx of back pain, tho no c/o pain today) Right, Left/from behind knees to buttocks bilat. Onset: More than a month ago. Quality: Constant, Aching. Timing: Constant. Modifying factor(s): MILD has helped back pain, Tylenol  Arthritis. Vitals:  height is 5' 11 (1.803 m) and weight is 160 lb (72.6 kg). His temperature is 98.1 F (36.7 C). His blood pressure is 136/51 (abnormal) and his pulse is 53 (abnormal). His respiration is 16 and oxygen saturation is 97%.  BMI: Estimated body mass index is 22.32 kg/m as calculated from the following:   Height as of this encounter: 5' 11 (1.803 m).   Weight as of this encounter: 160 lb (72.6 kg).  Last encounter: 09/06/2023. Last procedure: 08/22/2023.  Reason for encounter:  follow-up evaluation.   Discussed the use of AI scribe software for clinical note transcription with the patient, who gave verbal consent to proceed.  History of Present Illness   Todd Bennett is an 89 year old male who presents with recurrent falls and leg pain.  He experiences recurrent falls and difficulty maintaining an upright posture, primarily occurring at home. The last fall occurred a couple of weeks ago.  He describes resurfacing pain that radiates from his knees up to his buttocks, persistent since his last spinal ablation in October 2024. He had a previous ablation in 2018. He is not currently taking medications like Lyrica or gabapentin  for nerve pain but uses Tylenol .  He reports sleeping well, stating 'I sleep very quick' and 'I sleep very long,' without moving during sleep.     MILD procedure helped with his low back pain and walking distance  ROS  Constitutional: Denies any fever or chills Gastrointestinal: No reported hemesis, hematochezia, vomiting, or acute GI distress Musculoskeletal: Denies any acute onset joint swelling, redness, loss of ROM, or weakness Neurological: No reported episodes of acute onset apraxia, aphasia, dysarthria, agnosia, amnesia, paralysis, loss of coordination, or loss of consciousness  Medication Review  Cholecalciferol , FLUoxetine , Fish Oil, Multi-Vitamins, acetaminophen , aspirin  EC, atorvastatin , celecoxib , finasteride , gabapentin , losartan , metoprolol  tartrate, metroNIDAZOLE , mirtazapine , nitroGLYCERIN , omeprazole, tamsulosin , venlafaxine  XR, and vitamin C   History Review  Allergy: Todd Bennett is allergic to ciprofloxacin, clarithromycin, elemental sulfur, oxycodone, and penicillins. Drug: Todd Bennett  reports no history of drug use. Alcohol:  reports no history of alcohol use. Tobacco:  reports that he has quit smoking. He has never used smokeless  tobacco. Social: Todd Bennett  reports that he has quit smoking. He has never used smokeless tobacco.  He reports that he does not drink alcohol and does not use drugs. Medical:  has a past medical history of Actinic keratosis, Allergy, Anxiety (06/17/2014), Basal cell carcinoma (06/24/2009), Basal cell carcinoma (09/11/2012), Basal cell carcinoma (02/19/2021), Benign prostatic hyperplasia with urinary obstruction (06/17/2014), Benign prostatic hypertrophy without urinary obstruction (06/20/2013), Bladder calculi (04/26/2013), Calculi, ureter (03/29/2013), Calculus of kidney (02/25/2013), Cataract, Clinical depression (06/17/2014), Degenerative disc disease, lumbar, Frank hematuria (06/28/2013), HLD (hyperlipidemia) (06/17/2014), squamous cell carcinoma, Spondylolysis, Squamous cell carcinoma of skin (09/11/2012), Squamous cell carcinoma of skin (03/10/2015), and Squamous cell carcinoma of skin (07/26/2016). Surgical: Todd Bennett  has a past surgical history that includes Ileostomy; Lithotripsy (03/2013); Hernia repair (1993); LEFT HEART CATH AND CORONARY ANGIOGRAPHY (N/A, 03/15/2017); CORONARY STENT INTERVENTION (N/A, 03/15/2017); IR Angiogram Extremity Left (02/10/2018); IR Angiogram Selective Each Additional Vessel (02/10/2018); IR Angiogram Selective Each Additional Vessel (02/10/2018); IR US  Guide Vasc Access Right (02/10/2018); IR EMBO VENOUS NOT HEMORR HEMANG  INC GUIDE ROADMAPPING (02/10/2018); and IR Angiogram Selective Each Additional Vessel (02/10/2018). Family: family history includes Diabetes in his mother.  Laboratory Chemistry Profile   Renal Lab Results  Component Value Date   BUN 20 09/03/2021   CREATININE 0.90 09/03/2021   BCR 17 10/13/2017   GFRAA >60 02/12/2018   GFRNONAA >60 09/03/2021    Hepatic Lab Results  Component Value Date   AST 25 03/14/2017   ALT 23 03/14/2017   ALBUMIN 3.9 03/14/2017   ALKPHOS 52 03/14/2017   LIPASE 39 03/14/2017    Electrolytes Lab Results  Component Value Date   NA 139 09/03/2021   K 4.0 09/03/2021   CL 109 09/03/2021   CALCIUM  9.1 09/03/2021     Bone No results found for: VD25OH, VD125OH2TOT, CI6874NY7, CI7874NY7, 25OHVITD1, 25OHVITD2, 25OHVITD3, TESTOFREE, TESTOSTERONE  Inflammation (CRP: Acute Phase) (ESR: Chronic Phase) No results found for: CRP, ESRSEDRATE, LATICACIDVEN       Note: Above Lab results reviewed.   Physical Exam  Vitals: BP (!) 136/51   Pulse (!) 53   Temp 98.1 F (36.7 C)   Resp 16   Ht 5' 11 (1.803 m)   Wt 160 lb (72.6 kg)   SpO2 97%   BMI 22.32 kg/m  BMI: Estimated body mass index is 22.32 kg/m as calculated from the following:   Height as of this encounter: 5' 11 (1.803 m).   Weight as of this encounter: 160 lb (72.6 kg). Ideal: Ideal body weight: 75.3 kg (166 lb 0.1 oz) General appearance: Well nourished, well developed, and well hydrated. In no apparent acute distress Mental status: Alert, oriented x 3 (person, place, & time)       Respiratory: No evidence of acute respiratory distress Eyes: PERLA   Assessment   Diagnosis  1. Spinal stenosis, lumbar region, with neurogenic claudication   2. Lumbar spondylosis   3. Chronic radicular lumbar pain   4. Left medial knee pain       Plan of Care   Todd Bennett has a current medication list which includes the following long-term medication(s): atorvastatin , fluoxetine , gabapentin , losartan , metoprolol  tartrate, mirtazapine , nitroglycerin , and omeprazole.  Patient is doing well after mild and notes improvement in his low back and buttock pain and his ability to stand and walk.  He does endorse bilateral leg pain that is chronic and persistent.  We discussed gabapentin  to help out with his leg pain.  This is  likely neuropathic in nature.  Future considerations could also include repeating lumbar radiofrequency ablation which was done in October 2024 to help address his axial low back pain related to lumbar facet arthropathy however the patient is still doing well in that regard.  Pharmacotherapy (Medications  Ordered): Meds ordered this encounter  Medications   gabapentin  (NEURONTIN ) 100 MG capsule    Sig: Take 1-3 capsules (100-300 mg total) by mouth at bedtime.    Dispense:  135 capsule    Refill:  1   Orders:  No orders of the defined types were placed in this encounter.   Return for patient will call to schedule F2F appt prn.    Recent Visits Date Type Provider Dept  09/06/23 Office Visit Marcelino Nurse, MD [facility]  Showing recent visits within past 90 days and meeting all other requirements Today's Visits Date Type Provider Dept  11/29/23 Office Visit Marcelino Nurse, MD [facility]  Showing today's visits and meeting all other requirements Future Appointments No visits were found meeting these conditions. Showing future appointments within next 90 days and meeting all other requirements  I discussed the assessment and treatment plan with the patient. The patient was provided an opportunity to ask questions and all were answered. The patient agreed with the plan and demonstrated an understanding of the instructions.  Patient advised to call back or seek an in-person evaluation if the symptoms or condition worsens.  I personally spent a total of 20 minutes in the care of the patient today including preparing to see the patient, getting/reviewing separately obtained history, performing a medically appropriate exam/evaluation, counseling and educating, placing orders, and documenting clinical information in the EHR.   Note by: Nurse Marcelino, MD (TTS and AI technology used. I apologize for any typographical errors that were not detected and corrected.) Date: 11/29/2023; Time: 11:58 AM

## 2023-11-29 NOTE — Progress Notes (Signed)
 Safety precautions to be maintained throughout the outpatient stay will include: orient to surroundings, keep bed in low position, maintain call bell within reach at all times, provide assistance with transfer out of bed and ambulation.

## 2023-12-05 DIAGNOSIS — Z961 Presence of intraocular lens: Secondary | ICD-10-CM | POA: Diagnosis not present

## 2023-12-05 DIAGNOSIS — H189 Unspecified disorder of cornea: Secondary | ICD-10-CM | POA: Diagnosis not present

## 2023-12-07 DIAGNOSIS — C44319 Basal cell carcinoma of skin of other parts of face: Secondary | ICD-10-CM | POA: Diagnosis not present

## 2023-12-12 DIAGNOSIS — L814 Other melanin hyperpigmentation: Secondary | ICD-10-CM | POA: Diagnosis not present

## 2023-12-12 DIAGNOSIS — D04112 Carcinoma in situ of skin of right lower eyelid, including canthus: Secondary | ICD-10-CM | POA: Diagnosis not present

## 2023-12-12 DIAGNOSIS — L578 Other skin changes due to chronic exposure to nonionizing radiation: Secondary | ICD-10-CM | POA: Diagnosis not present

## 2023-12-14 DIAGNOSIS — D0462 Carcinoma in situ of skin of left upper limb, including shoulder: Secondary | ICD-10-CM | POA: Diagnosis not present

## 2024-02-03 ENCOUNTER — Ambulatory Visit: Admitting: Podiatry

## 2024-05-09 ENCOUNTER — Ambulatory Visit: Admitting: Urology
